# Patient Record
Sex: Female | Born: 1955 | Race: Black or African American | Hispanic: No | Marital: Single | Smoking: Never smoker
Health system: Southern US, Community
[De-identification: ages and names within clinical notes are randomized; demographics above are authoritative.]

## PROBLEM LIST (undated history)

## (undated) DIAGNOSIS — Z803 Family history of malignant neoplasm of breast: Secondary | ICD-10-CM

## (undated) DIAGNOSIS — I10 Essential (primary) hypertension: Secondary | ICD-10-CM

## (undated) DIAGNOSIS — Z8042 Family history of malignant neoplasm of prostate: Secondary | ICD-10-CM

## (undated) DIAGNOSIS — K649 Unspecified hemorrhoids: Secondary | ICD-10-CM

## (undated) DIAGNOSIS — Z923 Personal history of irradiation: Secondary | ICD-10-CM

## (undated) DIAGNOSIS — Z9221 Personal history of antineoplastic chemotherapy: Secondary | ICD-10-CM

## (undated) HISTORY — DX: Family history of malignant neoplasm of prostate: Z80.42

## (undated) HISTORY — PX: BREAST BIOPSY: SHX20

## (undated) HISTORY — PX: PARTIAL HYSTERECTOMY: SHX80

## (undated) HISTORY — PX: BREAST LUMPECTOMY: SHX2

## (undated) HISTORY — DX: Family history of malignant neoplasm of breast: Z80.3

---

## 1999-10-15 ENCOUNTER — Ambulatory Visit (HOSPITAL_COMMUNITY): Admission: RE | Admit: 1999-10-15 | Discharge: 1999-10-15 | Payer: Self-pay | Admitting: Family Medicine

## 1999-10-15 ENCOUNTER — Encounter: Payer: Self-pay | Admitting: Family Medicine

## 2000-10-26 ENCOUNTER — Ambulatory Visit (HOSPITAL_COMMUNITY): Admission: RE | Admit: 2000-10-26 | Discharge: 2000-10-26 | Payer: Self-pay | Admitting: Family Medicine

## 2005-05-07 ENCOUNTER — Encounter: Admission: RE | Admit: 2005-05-07 | Discharge: 2005-05-07 | Payer: Self-pay | Admitting: Obstetrics and Gynecology

## 2006-02-23 ENCOUNTER — Encounter: Admission: RE | Admit: 2006-02-23 | Discharge: 2006-02-23 | Payer: Self-pay | Admitting: Family Medicine

## 2006-05-10 ENCOUNTER — Encounter: Admission: RE | Admit: 2006-05-10 | Discharge: 2006-05-10 | Payer: Self-pay | Admitting: Surgery

## 2006-06-24 ENCOUNTER — Ambulatory Visit (HOSPITAL_BASED_OUTPATIENT_CLINIC_OR_DEPARTMENT_OTHER): Admission: RE | Admit: 2006-06-24 | Discharge: 2006-06-24 | Payer: Self-pay | Admitting: Surgery

## 2006-06-24 ENCOUNTER — Encounter (INDEPENDENT_AMBULATORY_CARE_PROVIDER_SITE_OTHER): Payer: Self-pay | Admitting: Specialist

## 2007-08-22 ENCOUNTER — Other Ambulatory Visit: Admission: RE | Admit: 2007-08-22 | Discharge: 2007-08-22 | Payer: Self-pay | Admitting: Family Medicine

## 2007-08-23 ENCOUNTER — Encounter: Admission: RE | Admit: 2007-08-23 | Discharge: 2007-08-23 | Payer: Self-pay | Admitting: Surgery

## 2008-09-20 ENCOUNTER — Encounter: Admission: RE | Admit: 2008-09-20 | Discharge: 2008-09-20 | Payer: Self-pay | Admitting: Family Medicine

## 2010-01-23 ENCOUNTER — Encounter: Admission: RE | Admit: 2010-01-23 | Discharge: 2010-01-23 | Payer: Self-pay | Admitting: Family Medicine

## 2010-03-17 ENCOUNTER — Other Ambulatory Visit: Admission: RE | Admit: 2010-03-17 | Discharge: 2010-03-17 | Payer: Self-pay | Admitting: Family Medicine

## 2010-10-09 NOTE — Op Note (Signed)
NAMEAMARANTA, Kathleen Potter              ACCOUNT NO.:  [id number]   MEDICAL RECORD NO.:  [id number]          PATIENT TYPE:  AMB   LOCATION:  [facility]                         FACILITY:  [facility]   PHYSICIAN:  Currie Paris, M.D.DATE OF BIRTH:  10-May-1956   DATE OF PROCEDURE:  06/24/2006  DATE OF DISCHARGE:                               OPERATIVE REPORT   OFFICE MEDICAL RECORD NUMBER [id number]   PREOPERATIVE DIAGNOSIS:  Nipple discharge, right breast.   POSTOPERATIVE DIAGNOSIS:  Nipple discharge, right breast.   OPERATION:  Central ductal excision, right breast.   SURGEON:  Currie Paris, M.D.   ANESTHESIA:  General.   CLINICAL HISTORY:  [name] is a 55 year old lady with a spontaneous  right breast nipple discharge and ductogram showing a filling defect.  After discussion with the patient, she elected to proceed to central  ductal excision for biopsy purposes.   DESCRIPTION OF PROCEDURE:  The patient was seen in the holding area and  she had no further questions.  She and I both marked the right side as  the operative side.   The patient was taken to the operating room and after satisfactory  general anesthesia, the breast was prepped and draped.  The time-out  occurred.   I reviewed the mammogram films and ductogram films and confirmed the  location of the duct.   With gentle manipulation, I was able to get some discharge from a  central duct.  I cannulated the duct with a double 0 tear duct probe.  I  then was able to cannulated it with a 24 Angiocath and inject some  methylene blue.  Once that was done, I put the tear duct probe back in.   I then made an incision, curvilinear in shape, at the superior areolar  margin.  I elevated the areolar skin until I could identify the duct,  both by identifying the probe in it and the bright blue color.  The duct  was divided just under the skin of the nipple.  The direction of the  probe was almost directly posteriorly, so once  that was removed, I  grasped the end of the breast tissue where the duct was then using  cautery, I took a cylinder of tissue around it, trying to incorporate  all the blue dye that I saw in the perfect ducts.  This went almost back  to the chest wall.   The specimen was then taken off the table and sent for pathology after  placing a suture on it to orient the nipple end for Pathology.   Attention was turned back to the breast.  I infiltrated 0.25% plain  Marcaine to help with postop analgesia.  We irrigated and made sure  everything was dry and then closed in layers with 3-0 Vicryl, 4-0  Monocryl subcuticular plus Dermabond on the skin.   The patient tolerated the procedure well.  There were no operative  complications.  All counts were correct.      Currie Paris, M.D.  Electronically Signed     CJS/MEDQ  D:  06/24/2006  T:  06/24/2006  Job:  [id number]   cc:   Sigmund Hazel, M.D.  Fax: [phone]

## 2012-03-01 ENCOUNTER — Other Ambulatory Visit: Payer: Self-pay | Admitting: Family Medicine

## 2012-03-01 DIAGNOSIS — Z1231 Encounter for screening mammogram for malignant neoplasm of breast: Secondary | ICD-10-CM

## 2012-03-21 ENCOUNTER — Ambulatory Visit: Payer: Self-pay

## 2012-03-23 ENCOUNTER — Ambulatory Visit
Admission: RE | Admit: 2012-03-23 | Discharge: 2012-03-23 | Disposition: A | Discharge status: Home / Self Care | Payer: BC Managed Care – PPO | Source: Ambulatory Visit | Attending: Family Medicine | Admitting: Family Medicine

## 2012-03-23 DIAGNOSIS — Z1231 Encounter for screening mammogram for malignant neoplasm of breast: Secondary | ICD-10-CM

## 2014-07-23 ENCOUNTER — Other Ambulatory Visit: Payer: Self-pay

## 2014-07-23 DIAGNOSIS — Z1231 Encounter for screening mammogram for malignant neoplasm of breast: Secondary | ICD-10-CM

## 2014-08-01 ENCOUNTER — Ambulatory Visit
Admission: RE | Admit: 2014-08-01 | Discharge: 2014-08-01 | Disposition: A | Discharge status: Home / Self Care | Payer: BC Managed Care – PPO | Source: Ambulatory Visit

## 2014-08-01 ENCOUNTER — Encounter (INDEPENDENT_AMBULATORY_CARE_PROVIDER_SITE_OTHER): Payer: Self-pay

## 2014-08-01 DIAGNOSIS — Z1231 Encounter for screening mammogram for malignant neoplasm of breast: Secondary | ICD-10-CM

## 2017-06-02 ENCOUNTER — Other Ambulatory Visit: Payer: Self-pay | Admitting: Family Medicine

## 2017-06-02 DIAGNOSIS — Z1231 Encounter for screening mammogram for malignant neoplasm of breast: Secondary | ICD-10-CM

## 2017-06-23 ENCOUNTER — Ambulatory Visit
Admission: RE | Admit: 2017-06-23 | Discharge: 2017-06-23 | Disposition: A | Discharge status: Home / Self Care | Payer: BC Managed Care – PPO | Source: Ambulatory Visit | Attending: Family Medicine | Admitting: Family Medicine

## 2017-06-23 DIAGNOSIS — Z1231 Encounter for screening mammogram for malignant neoplasm of breast: Secondary | ICD-10-CM

## 2017-06-24 ENCOUNTER — Other Ambulatory Visit: Payer: Self-pay | Admitting: Family Medicine

## 2017-06-24 DIAGNOSIS — R928 Other abnormal and inconclusive findings on diagnostic imaging of breast: Secondary | ICD-10-CM

## 2017-07-28 ENCOUNTER — Other Ambulatory Visit: Payer: BC Managed Care – PPO

## 2017-08-02 ENCOUNTER — Ambulatory Visit
Admission: RE | Admit: 2017-08-02 | Discharge: 2017-08-02 | Disposition: A | Discharge status: Home / Self Care | Payer: BC Managed Care – PPO | Source: Ambulatory Visit | Attending: Family Medicine | Admitting: Family Medicine

## 2017-08-02 DIAGNOSIS — R928 Other abnormal and inconclusive findings on diagnostic imaging of breast: Secondary | ICD-10-CM

## 2018-05-24 HISTORY — PX: BREAST LUMPECTOMY: SHX2

## 2018-11-03 ENCOUNTER — Other Ambulatory Visit: Payer: Self-pay | Admitting: Family Medicine

## 2018-11-03 DIAGNOSIS — Z1231 Encounter for screening mammogram for malignant neoplasm of breast: Secondary | ICD-10-CM

## 2018-12-21 ENCOUNTER — Other Ambulatory Visit: Payer: Self-pay

## 2018-12-21 ENCOUNTER — Ambulatory Visit
Admission: RE | Admit: 2018-12-21 | Discharge: 2018-12-21 | Disposition: A | Discharge status: Home / Self Care | Payer: BC Managed Care – PPO | Source: Ambulatory Visit | Attending: Family Medicine | Admitting: Family Medicine

## 2018-12-21 DIAGNOSIS — Z1231 Encounter for screening mammogram for malignant neoplasm of breast: Secondary | ICD-10-CM

## 2018-12-22 ENCOUNTER — Other Ambulatory Visit: Payer: Self-pay | Admitting: Family Medicine

## 2018-12-22 DIAGNOSIS — R928 Other abnormal and inconclusive findings on diagnostic imaging of breast: Secondary | ICD-10-CM

## 2018-12-25 ENCOUNTER — Other Ambulatory Visit: Payer: Self-pay | Admitting: Family Medicine

## 2018-12-27 ENCOUNTER — Ambulatory Visit
Admission: RE | Admit: 2018-12-27 | Discharge: 2018-12-27 | Disposition: A | Discharge status: Home / Self Care | Payer: BC Managed Care – PPO | Source: Ambulatory Visit | Attending: Family Medicine | Admitting: Family Medicine

## 2018-12-27 ENCOUNTER — Other Ambulatory Visit: Payer: Self-pay

## 2018-12-27 ENCOUNTER — Other Ambulatory Visit: Payer: Self-pay | Admitting: Family Medicine

## 2018-12-27 DIAGNOSIS — R928 Other abnormal and inconclusive findings on diagnostic imaging of breast: Secondary | ICD-10-CM

## 2018-12-27 DIAGNOSIS — N6310 Unspecified lump in the right breast, unspecified quadrant: Secondary | ICD-10-CM

## 2018-12-28 ENCOUNTER — Other Ambulatory Visit: Payer: Self-pay

## 2018-12-28 ENCOUNTER — Ambulatory Visit
Admission: RE | Admit: 2018-12-28 | Discharge: 2018-12-28 | Disposition: A | Discharge status: Home / Self Care | Payer: BC Managed Care – PPO | Source: Ambulatory Visit | Attending: Family Medicine | Admitting: Family Medicine

## 2018-12-28 DIAGNOSIS — N6310 Unspecified lump in the right breast, unspecified quadrant: Secondary | ICD-10-CM

## 2018-12-29 ENCOUNTER — Encounter: Payer: Self-pay | Admitting: *Deleted

## 2018-12-29 ENCOUNTER — Telehealth: Payer: Self-pay | Admitting: Hematology and Oncology

## 2018-12-29 NOTE — Telephone Encounter (Signed)
Spoke with patient to confirm afternoon [facility] appointment for 8/12, packet will be mailed to patient

## 2019-01-02 ENCOUNTER — Other Ambulatory Visit: Payer: Self-pay | Admitting: *Deleted

## 2019-01-02 DIAGNOSIS — Z171 Estrogen receptor negative status [ER-]: Secondary | ICD-10-CM | POA: Insufficient documentation

## 2019-01-02 DIAGNOSIS — C50211 Malignant neoplasm of upper-inner quadrant of right female breast: Secondary | ICD-10-CM

## 2019-01-02 NOTE — Progress Notes (Signed)
[facility] NOTE  Patient Care Team: [name], MD as PCP - General (Family Medicine) Rockwell Germany, RN as Oncology Nurse Navigator Tressie Ellis, Paulette Blanch, RN as Oncology Nurse Navigator Erroll Luna, MD as Consulting Physician (General Surgery) Nicholas Lose, MD as Consulting Physician (Hematology and Oncology) Kyung Rudd, MD as Consulting Physician (Radiation Oncology)  CHIEF COMPLAINTS/PURPOSE OF CONSULTATION:  Newly diagnosed breast cancer  HISTORY OF PRESENTING ILLNESS:  Kathleen Potter 63 y.o. female is here because of recent diagnosis of invasive ductal carcinoma of the right breast. The cancer was detected on a routine screening mammogram on 12/21/18 and was not palpable prior to diagnosis. Diagnostic mammogram on 12/27/18 showed a 1.2cm mass in the right breast at the 12:30 location 7 cm from the nipple with no axillary adenopathy. Biopsy on 12/28/18 showed invasive ductal carcinoma, grade 2-3, HER-2 negative (1+), ER/PR negative, Ki67 80%. She has a family history of breast cancer in her sister at 72yo. She presents to the clinic today for initial evaluation and discussion of treatment options.   I reviewed her records extensively and collaborated the history with the patient.  SUMMARY OF ONCOLOGIC HISTORY: Oncology History  Malignant neoplasm of upper-inner quadrant of right breast in female, estrogen receptor negative ([place])  01/02/2019 Initial Diagnosis   Routine screening mammogram detected a 1.2cm mass in the right breast at the 12:30 location 7 cm from the nipple, no axillary adenopathy. Biopsy showed IDC, grade 2-3, HER-2 - (1+), ER -, PR -, Ki67 80%.      MEDICAL HISTORY:  History reviewed. No pertinent past medical history.  SURGICAL HISTORY: Past Surgical History:  Procedure Laterality Date  . BREAST BIOPSY Right   . PARTIAL HYSTERECTOMY      SOCIAL HISTORY: Social History   Socioeconomic History  . Marital status: Single    Spouse  name: Not on file  . Number of children: Not on file  . Years of education: Not on file  . Highest education level: Not on file  Occupational History  . Not on file  Social Needs  . Financial resource strain: Not on file  . Food insecurity    Worry: Not on file    Inability: Not on file  . Transportation needs    Medical: Not on file    Non-medical: Not on file  Tobacco Use  . Smoking status: Never Smoker  Substance and Sexual Activity  . Alcohol use: Not Currently  . Drug use: Never  . Sexual activity: Not on file  Lifestyle  . Physical activity    Days per week: Not on file    Minutes per session: Not on file  . Stress: Not on file  Relationships  . Social Herbalist on phone: Not on file    Gets together: Not on file    Attends religious service: Not on file    Active member of club or organization: Not on file    Attends meetings of clubs or organizations: Not on file    Relationship status: Not on file  . Intimate partner violence    Fear of current or ex partner: Not on file    Emotionally abused: Not on file    Physically abused: Not on file    Forced sexual activity: Not on file  Other Topics Concern  . Not on file  Social History Narrative  . Not on file    FAMILY HISTORY: Family History  Problem Relation Age of  Onset  . Breast cancer Sister 8    ALLERGIES:  has no allergies on file.  MEDICATIONS:  No current outpatient medications on file.   No current facility-administered medications for this visit.     REVIEW OF SYSTEMS:   Constitutional: Denies fevers, chills or abnormal night sweats Eyes: Denies blurriness of vision, double vision or watery eyes Ears, nose, mouth, throat, and face: Denies mucositis or sore throat Respiratory: Denies cough, dyspnea or wheezes Cardiovascular: Denies palpitation, chest discomfort or lower extremity swelling Gastrointestinal:  Denies nausea, heartburn or change in bowel habits Skin: Denies  abnormal skin rashes Lymphatics: Denies new lymphadenopathy or easy bruising Neurological:Denies numbness, tingling or new weaknesses Behavioral/Psych: Mood is stable, no new changes  Breast: Denies any palpable lumps or discharge All other systems were reviewed with the patient and are negative.  PHYSICAL EXAMINATION: ECOG PERFORMANCE STATUS: 0 - Asymptomatic  Vitals:   01/03/19 1240  BP: (!) 147/71  Pulse: 65  Resp: 18  Temp: 97.8 F (36.6 C)  SpO2: 100%   Filed Weights   01/03/19 1240  Weight: 262 lb 3.2 oz (118.9 kg)    GENERAL:alert, no distress and comfortable SKIN: skin color, texture, turgor are normal, no rashes or significant lesions EYES: normal, conjunctiva are pink and non-injected, sclera clear OROPHARYNX:no exudate, no erythema and lips, buccal mucosa, and tongue normal  NECK: supple, thyroid normal size, non-tender, without nodularity LYMPH:  no palpable lymphadenopathy in the cervical, axillary or inguinal LUNGS: clear to auscultation and percussion with normal breathing effort HEART: regular rate & rhythm and no murmurs and no lower extremity edema ABDOMEN:abdomen soft, non-tender and normal bowel sounds Musculoskeletal:no cyanosis of digits and no clubbing  PSYCH: alert & oriented x 3 with fluent speech NEURO: no focal motor/sensory deficits BREAST: No palpable nodules in breast. No palpable axillary or supraclavicular lymphadenopathy (exam performed in the presence of a chaperone)   LABORATORY DATA:  I have reviewed the data as listed Lab Results  Component Value Date   WBC 5.4 01/03/2019   HGB 12.0 01/03/2019   HCT 38.3 01/03/2019   MCV 71.9 (L) 01/03/2019   PLT 201 01/03/2019   Lab Results  Component Value Date   NA 140 01/03/2019   K 3.7 01/03/2019   CL 108 01/03/2019   CO2 23 01/03/2019    RADIOGRAPHIC STUDIES: I have personally reviewed the radiological reports and agreed with the findings in the report.  ASSESSMENT AND PLAN:   Malignant neoplasm of upper-inner quadrant of right breast in female, estrogen receptor negative ([place]) 01/02/2019:Routine screening mammogram detected a 1.2cm mass in the right breast at the 12:30 location 7 cm from the nipple, no axillary adenopathy. Biopsy showed IDC, grade 2-3, HER-2 - (1+), ER -, PR -, Ki67 80%.   Pathology and radiology counseling: Discussed with the patient, the details of pathology including the type of breast cancer,the clinical staging, the significance of ER, PR and HER-2/neu receptors and the implications for treatment. After reviewing the pathology in detail, we proceeded to discuss the different treatment options between surgery, radiation, chemotherapy, antiestrogen therapies.  Treatment plan: 1.  Breast conserving surgery with sentinel for biopsy 2.  Adjuvant chemotherapy with dose dense Adriamycin and Cytoxan x4 followed by Taxol weekly x12 3.  Followed by radiation therapy  Chemotherapy Counseling: I discussed the risks and benefits of chemotherapy including the risks of nausea/ vomiting, risk of infection from low WBC count, fatigue due to chemo or anemia, bruising or bleeding due to low platelets,  mouth sores, loss/ change in taste and decreased appetite. Liver and kidney function will be monitored through out chemotherapy as abnormalities in liver and kidney function may be a side effect of treatment. Cardiac dysfunction due to Adriamycin was discussed in detail. Risk of permanent bone marrow dysfunction and leukemia due to chemo were also discussed.  Return to clinic after surgery to discuss final pathology report and to discuss her starting adjuvant chemotherapy.  She would be eligible for upbeat clinical trial. 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  She may be taking disability or FMLA for her treatments.  All questions were answered. The patient knows to call the clinic with any problems, questions or concerns.    Rulon Eisenmenger, MD 01/03/2019    I, Molly Dorshimer, am acting as scribe for Nicholas Lose, MD.  I have reviewed the above documentation for accuracy and completeness, and I agree with the above.

## 2019-01-03 ENCOUNTER — Ambulatory Visit: Payer: BC Managed Care – PPO | Attending: Surgery | Admitting: Physical Therapy

## 2019-01-03 ENCOUNTER — Ambulatory Visit (HOSPITAL_BASED_OUTPATIENT_CLINIC_OR_DEPARTMENT_OTHER): Payer: BC Managed Care – PPO | Admitting: Genetic Counselor

## 2019-01-03 ENCOUNTER — Other Ambulatory Visit: Payer: Self-pay

## 2019-01-03 ENCOUNTER — Encounter: Payer: Self-pay | Admitting: Genetic Counselor

## 2019-01-03 ENCOUNTER — Ambulatory Visit: Payer: Self-pay | Admitting: Surgery

## 2019-01-03 ENCOUNTER — Inpatient Hospital Stay: Payer: BC Managed Care – PPO | Attending: Hematology and Oncology | Admitting: Hematology and Oncology

## 2019-01-03 ENCOUNTER — Encounter: Payer: Self-pay | Admitting: Physical Therapy

## 2019-01-03 ENCOUNTER — Ambulatory Visit
Admission: RE | Admit: 2019-01-03 | Discharge: 2019-01-03 | Disposition: A | Discharge status: Home / Self Care | Payer: BC Managed Care – PPO | Source: Ambulatory Visit | Attending: Radiation Oncology | Admitting: Radiation Oncology

## 2019-01-03 ENCOUNTER — Encounter: Payer: Self-pay | Admitting: *Deleted

## 2019-01-03 ENCOUNTER — Encounter: Payer: Self-pay | Admitting: Hematology and Oncology

## 2019-01-03 ENCOUNTER — Inpatient Hospital Stay: Payer: BC Managed Care – PPO

## 2019-01-03 VITALS — BP 147/71 | HR 65 | Temp 97.8°F | Resp 18 | Ht 66.0 in | Wt 262.2 lb

## 2019-01-03 DIAGNOSIS — C50211 Malignant neoplasm of upper-inner quadrant of right female breast: Secondary | ICD-10-CM

## 2019-01-03 DIAGNOSIS — Z803 Family history of malignant neoplasm of breast: Secondary | ICD-10-CM | POA: Insufficient documentation

## 2019-01-03 DIAGNOSIS — C50911 Malignant neoplasm of unspecified site of right female breast: Secondary | ICD-10-CM

## 2019-01-03 DIAGNOSIS — Z171 Estrogen receptor negative status [ER-]: Secondary | ICD-10-CM

## 2019-01-03 DIAGNOSIS — R293 Abnormal posture: Secondary | ICD-10-CM | POA: Insufficient documentation

## 2019-01-03 DIAGNOSIS — Z8042 Family history of malignant neoplasm of prostate: Secondary | ICD-10-CM

## 2019-01-03 LAB — CBC WITH DIFFERENTIAL (CANCER CENTER ONLY)
Abs Immature Granulocytes: 0.01 10*3/uL (ref 0.00–0.07)
Basophils Absolute: 0 10*3/uL (ref 0.0–0.1)
Basophils Relative: 0 %
Eosinophils Absolute: 0 10*3/uL (ref 0.0–0.5)
Eosinophils Relative: 1 %
HCT: 38.3 % (ref 36.0–46.0)
Hemoglobin: 12 g/dL (ref 12.0–15.0)
Immature Granulocytes: 0 %
Lymphocytes Relative: 41 %
Lymphs Abs: 2.2 10*3/uL (ref 0.7–4.0)
MCH: 22.5 pg — ABNORMAL LOW (ref 26.0–34.0)
MCHC: 31.3 g/dL (ref 30.0–36.0)
MCV: 71.9 fL — ABNORMAL LOW (ref 80.0–100.0)
Monocytes Absolute: 0.4 10*3/uL (ref 0.1–1.0)
Monocytes Relative: 7 %
Neutro Abs: 2.8 10*3/uL (ref 1.7–7.7)
Neutrophils Relative %: 51 %
Platelet Count: 201 10*3/uL (ref 150–400)
RBC: 5.33 MIL/uL — ABNORMAL HIGH (ref 3.87–5.11)
RDW: 15.9 % — ABNORMAL HIGH (ref 11.5–15.5)
WBC Count: 5.4 10*3/uL (ref 4.0–10.5)
nRBC: 0 % (ref 0.0–0.2)

## 2019-01-03 LAB — CMP (CANCER CENTER ONLY)
ALT: 15 U/L (ref 0–44)
AST: 16 U/L (ref 15–41)
Albumin: 3.7 g/dL (ref 3.5–5.0)
Alkaline Phosphatase: 73 U/L (ref 38–126)
Anion gap: 9 (ref 5–15)
BUN: 12 mg/dL (ref 8–23)
CO2: 23 mmol/L (ref 22–32)
Calcium: 8.8 mg/dL — ABNORMAL LOW (ref 8.9–10.3)
Chloride: 108 mmol/L (ref 98–111)
Creatinine: 0.95 mg/dL (ref 0.44–1.00)
GFR, Est AFR Am: >60 mL/min (ref >60)
GFR, Estimated: >60 mL/min (ref >60)
Glucose, Bld: 93 mg/dL (ref 70–99)
Potassium: 3.7 mmol/L (ref 3.5–5.1)
Sodium: 140 mmol/L (ref 135–145)
Total Bilirubin: 0.4 mg/dL (ref 0.3–1.2)
Total Protein: 7.2 g/dL (ref 6.5–8.1)

## 2019-01-03 NOTE — Assessment & Plan Note (Signed)
01/02/2019:Routine screening mammogram detected a 1.2cm mass in the right breast at the 12:30 location 7 cm from the nipple, no axillary adenopathy. Biopsy showed IDC, grade 2-3, HER-2 - (1+), ER -, PR -, Ki67 80%.   Pathology and radiology counseling: Discussed with the patient, the details of pathology including the type of breast cancer,the clinical staging, the significance of ER, PR and HER-2/neu receptors and the implications for treatment. After reviewing the pathology in detail, we proceeded to discuss the different treatment options between surgery, radiation, chemotherapy, antiestrogen therapies.  Treatment plan: 1.  Breast conserving surgery with sentinel for biopsy 2.  Adjuvant chemotherapy with dose dense Adriamycin and Cytoxan x4 followed by Taxol weekly x12 3.  Followed by radiation therapy  Chemotherapy Counseling: I discussed the risks and benefits of chemotherapy including the risks of nausea/ vomiting, risk of infection from low WBC count, fatigue due to chemo or anemia, bruising or bleeding due to low platelets, mouth sores, loss/ change in taste and decreased appetite. Liver and kidney function will be monitored through out chemotherapy as abnormalities in liver and kidney function may be a side effect of treatment. Cardiac dysfunction due to Adriamycin was discussed in detail. Risk of permanent bone marrow dysfunction and leukemia due to chemo were also discussed.  Return to clinic after surgery to discuss final pathology report and to discuss her starting adjuvant chemotherapy.  She would be eligible for upbeat clinical trial.

## 2019-01-03 NOTE — Therapy (Signed)
[place], [place], [address] Phone: [phone]   Fax:  [phone]  Physical Therapy Evaluation  Patient Details  Name: Kathleen Potter MRN: [id number] Date of Birth: 1956/05/22 Referring Provider (PT): Dr. Erroll Luna   Encounter Date: 01/03/2019  PT End of Session - 01/03/19 1404    Visit Number  1    Number of Visits  2    Date for PT Re-Evaluation  02/28/19    PT Start Time  1329    PT Stop Time  1343   Also saw pt from 1405-1415 for a total of 24 min   PT Time Calculation (min)  14 min    Activity Tolerance  Patient tolerated treatment well    Behavior During Therapy  [facility] for tasks assessed/performed       History reviewed. No pertinent past medical history.  Past Surgical History:  Procedure Laterality Date  . BREAST BIOPSY Right   . PARTIAL HYSTERECTOMY      There were no vitals filed for this visit.   Subjective Assessment - 01/03/19 1346    Subjective  Patient reports she is here today to be seen by her medical team for her newly diagnosed right breast cancer.    Pertinent History  Patient was diagnosed on 12/21/2018 with right grade II-III invasive ductal carcinoma breast cancer. It measures 1.2 cm and is located in the upper inner quadrant. It is triple negative with a Ki67 of 80%.    Patient Stated Goals  Reduce lymphedema risk and learn post op shoulder ROM HEP    Currently in Pain?  No/denies         [facility] PT Assessment - 01/03/19 0001      Assessment   Medical Diagnosis  Right breast cancer    Referring Provider (PT)  Dr. Marcello Moores [place]    Onset Date/Surgical Date  12/21/18    Hand Dominance  Right    Prior Therapy  none      Precautions   Precautions  Other (comment)    Precaution Comments  active cancer      Restrictions   Weight Bearing Restrictions  No      Balance Screen   Has the patient fallen in the past 6 months  No    Has the patient had a  decrease in activity level because of a fear of falling?   No    Is the patient reluctant to leave their home because of a fear of falling?   No      Home Environment   Living Environment  Private residence    Living Arrangements  Alone    Available Help at Discharge  Friend(s)      Prior Function   Level of Independence  Independent    Vocation  Full time employment    Vocation Requirements  Office asst at [employer]  She does not exercise      Cognition   Overall Cognitive Status  Within Functional Limits for tasks assessed      Posture/Postural Control   Posture/Postural Control  Postural limitations    Postural Limitations  Rounded Shoulders;Forward head      ROM / Strength   AROM / PROM / Strength  AROM;Strength      AROM   Overall AROM Comments  Cervical AROM is all WNL    AROM Assessment Site  Shoulder;Cervical    Right/Left Shoulder  Right;Left  Right Shoulder Extension  55 Degrees    Right Shoulder Flexion  143 Degrees    Right Shoulder ABduction  133 Degrees    Right Shoulder Internal Rotation  54 Degrees   painful   Right Shoulder External Rotation  78 Degrees    Left Shoulder Extension  60 Degrees    Left Shoulder Flexion  138 Degrees    Left Shoulder ABduction  129 Degrees    Left Shoulder Internal Rotation  58 Degrees    Left Shoulder External Rotation  71 Degrees      Strength   Overall Strength  Within functional limits for tasks performed        LYMPHEDEMA/ONCOLOGY QUESTIONNAIRE - 01/03/19 1349      Type   Cancer Type  Right breast      Lymphedema Assessments   Lymphedema Assessments  Upper extremities      Right Upper Extremity Lymphedema   10 cm Proximal to Olecranon Process  34.5 cm    Olecranon Process  30.2 cm    10 cm Proximal to Ulnar Styloid Process  25.8 cm    Just Proximal to Ulnar Styloid Process  17.5 cm    Across Hand at [employer]  20.3 cm    At [place] of 2nd Digit  7.3 cm      Left Upper Extremity Lymphedema   10  cm Proximal to Olecranon Process  36.3 cm    Olecranon Process  30.3 cm    10 cm Proximal to Ulnar Styloid Process  26.2 cm    Just Proximal to Ulnar Styloid Process  17.6 cm    Across Hand at [employer]  20.1 cm    At [place] of 2nd Digit  7 cm          Quick Dash - 01/03/19 0001    Open a tight or new jar  No difficulty    Do heavy household chores (wash walls, wash floors)  No difficulty    Carry a shopping bag or briefcase  No difficulty    Wash your back  No difficulty    Use a knife to cut food  No difficulty    Recreational activities in which you take some force or impact through your arm, shoulder, or hand (golf, hammering, tennis)  No difficulty    During the past week, to what extent has your arm, shoulder or hand problem interfered with your normal social activities with family, friends, neighbors, or groups?  Not at all    During the past week, to what extent has your arm, shoulder or hand problem limited your work or other regular daily activities  Not at all    Arm, shoulder, or hand pain.  None    Tingling (pins and needles) in your arm, shoulder, or hand  None    Difficulty Sleeping  No difficulty    DASH Score  0 %        Objective measurements completed on examination: See above findings.       Patient was instructed today in a home exercise program today for post op shoulder range of motion. These included active assist shoulder flexion in sitting, scapular retraction, wall walking with shoulder abduction, and hands behind head external rotation.  She was encouraged to do these twice a day, holding 3 seconds and repeating 5 times when permitted by her physician.           PT Education - 01/03/19 1354  Education Details  Lymphedema risk reduction and post op shoulder ROM HEP    Person(s) Educated  Patient    Methods  Explanation;Demonstration;Handout    Comprehension  Returned demonstration;Verbalized understanding          PT Long Term  Goals - 01/03/19 1427      PT LONG TERM GOAL #1   Title  Patient will demonstrate she has regained full shoulder ROM and function post operatively compared to baselines.    Time  8    Period  Weeks    Status  New      Breast Clinic Goals - 01/03/19 1425      Patient will be able to verbalize understanding of pertinent lymphedema risk reduction practices relevant to her diagnosis specifically related to skin care.   Time  1    Period  Days    Status  Achieved      Patient will be able to return demonstrate and/or verbalize understanding of the post-op home exercise program related to regaining shoulder range of motion.   Time  1    Period  Days    Status  Achieved      Patient will be able to verbalize understanding of the importance of attending the postoperative After Breast Cancer Class for further lymphedema risk reduction education and therapeutic exercise.   Time  1    Period  Days    Status  Achieved            Plan - 01/03/19 1405    Clinical Impression Statement  Patient was diagnosed on 12/21/2018 with right grade II-III invasive ductal carcinoma breast cancer. It measures 1.2 cm and is located in the upper inner quadrant. It is triple negative with a Ki67 of 80%. Her multidisciplinary medical team met prior to her assessments to determine a recommended treatment plan. She is planning to have a right lumpectomy and sentinel node biopsy followed by chemotherapy and radiation. She will benefit from a post op PT visit to reassess and determine needs.    Stability/Clinical Decision Making  Stable/Uncomplicated    Clinical Decision Making  Low    Rehab Potential  Excellent    PT Frequency  --   Eval and 1 f/u visit   PT Treatment/Interventions  Patient/family education;Therapeutic exercise;ADLs/Self Care Home Management    PT Next Visit Plan  Will reassess 3-4 weeks post op to determine needs    PT Home Exercise Plan  Post op shoulder ROM HEP    Consulted and Agree with  Plan of Care  Patient       Patient will benefit from skilled therapeutic intervention in order to improve the following deficits and impairments:  Postural dysfunction, Decreased range of motion, Decreased knowledge of precautions, Impaired UE functional use, Pain  Visit Diagnosis: 1. Malignant neoplasm of upper-inner quadrant of right breast in female, estrogen receptor negative ([place])   2. Abnormal posture      Patient will follow up at outpatient cancer rehab 3-4 weeks following surgery.  If the patient requires physical therapy at that time, a specific plan will be dictated and sent to the referring physician for approval. The patient was educated today on appropriate basic range of motion exercises to begin post operatively and the importance of attending the After Breast Cancer class following surgery.  Patient was educated today on lymphedema risk reduction practices as it pertains to recommendations that will benefit the patient immediately following surgery.  She verbalized good understanding.  Problem List Patient Active Problem List   Diagnosis Date Noted  . Malignant neoplasm of upper-inner quadrant of right breast in female, estrogen receptor negative ([place]) 01/02/2019   Annia Friendly, PT 01/03/19 2:29 PM  [place], [place], [address] Phone: [phone]   Fax:  [phone]  Name: Wilmary Levit MRN: [id number] Date of Birth: [birth year]

## 2019-01-03 NOTE — Progress Notes (Signed)
Radiation Oncology         [phone] ________________________________  Name: Kathleen Potter        MRN: [id number]  Date of Service: 01/03/2019 DOB: Oct 05, 1955  UL:AGTXMI, Kathleen Haw, MD  Erroll Luna, MD     REFERRING PHYSICIAN: Erroll Luna, MD   DIAGNOSIS: The encounter diagnosis was Malignant neoplasm of upper-inner quadrant of right breast in female, estrogen receptor negative ([place]).   HISTORY OF PRESENT ILLNESS: Kathleen Potter is a 63 y.o. female seen in the multidisciplinary breast clinic for a new diagnosis of right breast cancer. The patient was noted to have a screening detected abnormality in the posterior right breast.  Diagnostic imaging revealed this at the 12:30 position measuring 1.2 x 1.2 x 1.1 cm, and her right axilla was negative for adenopathy.  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  She is seen today to discuss treatment recommendations for her cancer.    PREVIOUS RADIATION THERAPY: No   PAST MEDICAL HISTORY: No past medical history on file.     PAST SURGICAL HISTORY: Past Surgical History:  Procedure Laterality Date   BREAST BIOPSY Right    PARTIAL HYSTERECTOMY       FAMILY HISTORY:  Family History  Problem Relation Age of Onset   Breast cancer Sister 12     SOCIAL HISTORY:  reports that she has never smoked. She does not have any smokeless tobacco history on file. She reports previous alcohol use. She reports that she does not use drugs. The patient is single and lives in [place]. 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 She's originally from [place].    ALLERGIES: Latex   MEDICATIONS:  No current outpatient medications on file.   No current facility-administered medications for this encounter.      REVIEW OF SYSTEMS: On review of systems, the patient reports that she is  doing well overall. She denies any chest pain, shortness of breath, cough, fevers, chills, night sweats, unintended weight changes. She denies any bowel or bladder disturbances, and denies abdominal pain, nausea or vomiting. She denies any new musculoskeletal or joint aches or pains. A complete review of systems is obtained and is otherwise negative.     PHYSICAL EXAM:  Wt Readings from Last 3 Encounters:  01/03/19 262 lb 3.2 oz (118.9 kg)   Temp Readings from Last 3 Encounters:  01/03/19 97.8 F (36.6 C) (Oral)   BP Readings from Last 3 Encounters:  01/03/19 (!) 147/71   Pulse Readings from Last 3 Encounters:  01/03/19 65     In general this is a well appearing African-American female in no acute distress.  She's alert and oriented x4 and appropriate throughout the examination. Cardiopulmonary assessment is negative for acute distress and she exhibits normal effort. Breast exam is deferred.   ECOG = 0  0 - Asymptomatic (Fully active, able to carry on all predisease activities without restriction)  1 - Symptomatic but completely ambulatory (Restricted in physically strenuous activity but ambulatory and able to carry out work of a light or sedentary nature. For example, light housework, office work)  2 - Symptomatic, <50% in bed during the day (Ambulatory and capable of all self care but unable to carry out any work activities. Up and about more than 50% of waking hours)  3 - Symptomatic, >50% in bed, but not bedbound (Capable of only limited  self-care, confined to bed or chair 50% or more of waking hours)  4 - Bedbound (Completely disabled. Cannot carry on any self-care. Totally confined to bed or chair)  5 - Death   Eustace Pen MM, Creech RH, Tormey DC, et al. [phone]). "Toxicity and response criteria of the [facility]". [place] Oncol. 5 (6): 649-55    LABORATORY DATA:  Lab Results  Component Value Date   WBC 5.4 01/03/2019   HGB 12.0 01/03/2019    HCT 38.3 01/03/2019   MCV 71.9 (L) 01/03/2019   PLT 201 01/03/2019   Lab Results  Component Value Date   NA 140 01/03/2019   K 3.7 01/03/2019   CL 108 01/03/2019   CO2 23 01/03/2019   Lab Results  Component Value Date   ALT 15 01/03/2019   AST 16 01/03/2019   ALKPHOS 73 01/03/2019   BILITOT 0.4 01/03/2019      RADIOGRAPHY: US Breast Ltd Uni Right Inc Axilla  Result Date: 12/27/2018 CLINICAL DATA:  Patient returns after screening study for evaluation of possible RIGHT breast mass. EXAM: DIGITAL DIAGNOSTIC RIGHT MAMMOGRAM WITH CAD AND TOMO ULTRASOUND RIGHT BREAST COMPARISON:  12/21/2018 and earlier ACR Breast Density Category b: There are scattered areas of fibroglandular density. FINDINGS: Additional 2-D and 3-D images are performed. These views confirm presence of an irregular mass in the [place] of the RIGHT breast. Mammographic images were processed with CAD. On physical exam, I palpate no abnormality in the UPPER INNER QUADRANT of the RIGHT breast. Targeted ultrasound is performed, showing an irregular hypoechoic taller than wide lesion in the 12:30 o'clock location RIGHT breast 7 centimeters from the nipple which measures 1.1 x 1.2 x 1.2 centimeters. There is associated internal blood flow on Doppler evaluation. Evaluation of the LEFT axilla is negative for adenopathy. IMPRESSION: Suspicious mass in the 12:30 o'clock location of the RIGHT breast. Tissue diagnosis is recommended. RIGHT axilla is negative. RECOMMENDATION: Ultrasound-guided core biopsy of the RIGHT breast I have discussed the findings and recommendations with the patient. Results were also provided in writing at the conclusion of the visit. If applicable, a reminder letter will be sent to the patient regarding the next appointment. BI-RADS CATEGORY  4: Suspicious. Electronically Signed   By: Nolon Nations M.D.   On: 12/27/2018 11:25   Mm Diag Breast Tomo Uni Right  Result Date: 12/27/2018 CLINICAL DATA:   Patient returns after screening study for evaluation of possible RIGHT breast mass. EXAM: DIGITAL DIAGNOSTIC RIGHT MAMMOGRAM WITH CAD AND TOMO ULTRASOUND RIGHT BREAST COMPARISON:  12/21/2018 and earlier ACR Breast Density Category b: There are scattered areas of fibroglandular density. FINDINGS: Additional 2-D and 3-D images are performed. These views confirm presence of an irregular mass in the [place] of the RIGHT breast. Mammographic images were processed with CAD. On physical exam, I palpate no abnormality in the UPPER INNER QUADRANT of the RIGHT breast. Targeted ultrasound is performed, showing an irregular hypoechoic taller than wide lesion in the 12:30 o'clock location RIGHT breast 7 centimeters from the nipple which measures 1.1 x 1.2 x 1.2 centimeters. There is associated internal blood flow on Doppler evaluation. Evaluation of the LEFT axilla is negative for adenopathy. IMPRESSION: Suspicious mass in the 12:30 o'clock location of the RIGHT breast. Tissue diagnosis is recommended. RIGHT axilla is negative. RECOMMENDATION: Ultrasound-guided core biopsy of the RIGHT breast I have discussed the findings and recommendations with the patient. Results were also provided in writing at the conclusion of  the visit. If applicable, a reminder letter will be sent to the patient regarding the next appointment. BI-RADS CATEGORY  4: Suspicious. Electronically Signed   By: Nolon Nations M.D.   On: 12/27/2018 11:25   Mm 3d Screen Breast Bilateral  Result Date: 12/21/2018 CLINICAL DATA:  Screening. EXAM: DIGITAL SCREENING BILATERAL MAMMOGRAM WITH TOMO AND CAD COMPARISON:  Previous exam(s). ACR Breast Density Category c: The breast tissue is heterogeneously dense, which may obscure small masses. FINDINGS: In the right breast, a possible mass warrants further evaluation. In the left breast, no findings suspicious for malignancy. Images were processed with CAD. IMPRESSION: Further evaluation is suggested for  possible mass in the right breast. RECOMMENDATION: Diagnostic mammogram and possibly ultrasound of the right breast. (Code:[id number]) The patient will be contacted regarding the findings, and additional imaging will be scheduled. BI-RADS CATEGORY  0: Incomplete. Need additional imaging evaluation and/or prior mammograms for comparison. Electronically Signed   By: Marin Olp M.D.   On: 12/21/2018 11:43   Mm Clip Placement Right  Result Date: 12/28/2018 CLINICAL DATA:  Patient underwent ultrasound-guided needle core biopsy for right breast mass. EXAM: DIAGNOSTIC RIGHT MAMMOGRAM POST ULTRASOUND BIOPSY COMPARISON:  Previous exam(s). FINDINGS: Mammographic images were obtained following ultrasound-guided core needle biopsy of a right breast mass which demonstrates appropriate placement of a ribbon clip. IMPRESSION: Appropriate clip placement following needle core biopsy of the right breast. Final Assessment: Post Procedure Mammograms for Marker Placement Electronically Signed   By: Audie Pinto M.D.   On: 12/28/2018 13:43   [place] Rt Breast Bx W Loc Dev 1st Lesion Img Bx Spec US Guide  Addendum Date: 01/01/2019   ADDENDUM REPORT: 01/01/2019 07:11 ADDENDUM: Pathology revealed GRADE II-III INVASIVE DUCTAL CARCINOMA WITH LYMPHOCYTE-RICH STROMA of the Right breast, 12:30 o'clock, (ribbon clip). This was found to be concordant by Dr. Audie Pinto. Pathology results were discussed with the patient by telephone. The patient reported doing well after the biopsy with tenderness at the site. Post biopsy instructions and care were reviewed and questions were answered. The patient was encouraged to call [facility] for any additional concerns. The patient was referred to [facility] at [facility] on January 03, 2019. Pathology results reported by Terie Purser, RN on 01/01/2019. Electronically Signed   By: Audie Pinto M.D.    On: 01/01/2019 07:11   Result Date: 01/01/2019 CLINICAL DATA:  Patient presents for biopsy of a right breast mass. EXAM: ULTRASOUND GUIDED RIGHT BREAST CORE NEEDLE BIOPSY COMPARISON:  Previous exam(s). FINDINGS: I met with the patient and we discussed the procedure of ultrasound-guided biopsy, including benefits and alternatives. We discussed the high likelihood of a successful procedure. We discussed the risks of the procedure, including infection, bleeding, tissue injury, clip migration, and inadequate sampling. Informed written consent was given. The usual time-out protocol was performed immediately prior to the procedure. Lesion quadrant: Upper inner quadrant Using sterile technique and 1% Lidocaine as local anesthetic, under direct ultrasound visualization, a 12 gauge spring-loaded device was used to perform biopsy of a right breast mass at 12:30 using a medial approach. At the conclusion of the procedure a ribbon tissue marker clip was deployed into the biopsy cavity. Follow up 2 view mammogram was performed and dictated separately. IMPRESSION: Ultrasound guided biopsy of a right breast mass at 12:30. No apparent complications. Electronically Signed: By: Audie Pinto M.D. On: 12/28/2018 13:44       IMPRESSION/PLAN: 1. Stage IB,  [id number] grade 3, triple negative invasive ductal carcinoma of the right breast. Dr. Lisbeth Renshaw discusses the pathology findings and reviews the nature of triple negative breast disease. The consensus from the breast conference includes breast conservation with lumpectomy with  sentinel node biopsy and placement of a PAC for adjuvant chemotherapy.  Following systemic chemotherapy, the patient would benefit from adjuvant radiotherapy to the breast to reduce the risk of local recurrence.  We discussed the risks, benefits, short, and long term effects of radiotherapy, and the patient is interested in proceeding. Dr. Lisbeth Renshaw discusses the delivery and logistics of radiotherapy and  anticipates a course of 4-6 1/2 weeks of radiotherapy. We will see her back about 2 to 3 weeks after completing chemotherapy to discuss moving forward with radiation. 2. Possible genetic predisposition to malignancy. The patient is a candidate for genetic testing given her personal and family history. She was offered referral and is interested in proceeding.   In a visit lasting 30 minutes, greater than 50% of the time was spent face to face discussing her case, and coordinating the patient's care.  The above documentation reflects my direct findings during this shared patient visit. Please see the separate note by Dr. Lisbeth Renshaw on this date for the remainder of the patient's plan of care.    Carola Rhine, PAC

## 2019-01-03 NOTE — H&P (Signed)
Barron Schmid Documented: 01/03/2019 7:20 AM Location: [facility] Patient #: [id number] DOB: 04/14/1956 Undefined / Language: Kathleen Potter / Race: Black or African American Female  History of Present Illness Marcello Moores A. Basheer Molchan MD; 01/03/2019 2:16 PM) Patient words: 63 YO female sent at the request of Dr Lindi Adie for [place] mammographic 1.2 cm mass right breast upper inner quadrant. Triple negative per pathology IDC grade 3 Sister with breast cancer in her 42's. No complaints of mass discharge or change in the appearance of her mammogram.  The patient is a 63 year old female.   Past Surgical History Tawni Pummel, RN; 01/03/2019 7:21 AM) Breast Biopsy Right. Hysterectomy (not due to cancer) - Partial Oral Surgery  Diagnostic Studies History Tawni Pummel, RN; 01/03/2019 7:21 AM) Colonoscopy >10 years ago Mammogram within last year Pap Smear >5 years ago  Medication History Tawni Pummel, RN; 01/03/2019 7:21 AM) Medications Reconciled  Social History Tawni Pummel, RN; 01/03/2019 7:21 AM) No alcohol use No drug use Tobacco use Never smoker.  Family History Tawni Pummel, RN; 01/03/2019 7:21 AM) Alcohol Abuse Father. Arthritis Father. Breast Cancer Sister. Diabetes Mellitus Family Members In General, Father. Heart Disease Father. Hypertension Father. Kidney Disease Father. Respiratory Condition Father, Mother.  Pregnancy / Birth History Tawni Pummel, RN; 01/03/2019 7:21 AM) Age at menarche 34 years. Age of menopause 41-50 Gravida 1 Irregular periods Maternal age 68-20 Para 1  Other Problems Tawni Pummel, RN; 01/03/2019 7:21 AM) Hemorrhoids     Review of Systems (Birdia Jaycox A. Anothony Bursch MD; 01/03/2019 2:16 PM) General Not Present- Appetite Loss, Chills, Fatigue, Fever, Night Sweats, Weight Gain and Weight Loss. Skin Not Present- Change in Wart/Mole, Dryness, Hives, Jaundice, New Lesions, Non-Healing Wounds, Rash and Ulcer. HEENT Not  Present- Earache, Hearing Loss, Hoarseness, Nose Bleed, Oral Ulcers, Ringing in the Ears, Seasonal Allergies, Sinus Pain, Sore Throat, Visual Disturbances, Wears glasses/contact lenses and Yellow Eyes. Respiratory Not Present- Bloody sputum, Chronic Cough, Difficulty Breathing, Snoring and Wheezing. Breast Not Present- Breast Mass, Breast Pain, Nipple Discharge and Skin Changes. Cardiovascular Not Present- Chest Pain, Difficulty Breathing Lying Down, Leg Cramps, Palpitations, Rapid Heart Rate, Shortness of Breath and Swelling of Extremities. Gastrointestinal Not Present- Abdominal Pain, Bloating, Bloody Stool, Change in Bowel Habits, Chronic diarrhea, Constipation, Difficulty Swallowing, Excessive gas, Gets full quickly at meals, Hemorrhoids, Indigestion, Nausea, Rectal Pain and Vomiting. Female Genitourinary Not Present- Frequency, Nocturia, Painful Urination, Pelvic Pain and Urgency. Musculoskeletal Not Present- Back Pain, Joint Pain, Joint Stiffness, Muscle Pain, Muscle Weakness and Swelling of Extremities. Neurological Not Present- Decreased Memory, Fainting, Headaches, Numbness, Seizures, Tingling, Tremor, Trouble walking and Weakness. Psychiatric Not Present- Anxiety, Bipolar, Change in Sleep Pattern, Depression, Fearful and Frequent crying. Endocrine Not Present- Cold Intolerance, Excessive Hunger, Hair Changes, Heat Intolerance, Hot flashes and New Diabetes. Hematology Not Present- Blood Thinners, Easy Bruising, Excessive bleeding, Gland problems, HIV and Persistent Infections. All other systems negative   Physical Exam (Mickell Birdwell A. Mishel Sans MD; 01/03/2019 2:17 PM)  General Mental Status-Alert. General Appearance-Consistent with stated age. Hydration-Well hydrated. Voice-Normal.  Head and Neck Head-normocephalic, atraumatic with no lesions or palpable masses. Trachea-midline. Thyroid Gland Characteristics - normal size and consistency.  Eye Eyeball -  Bilateral-Extraocular movements intact. Sclera/Conjunctiva - Bilateral-No scleral icterus.  Chest and Lung Exam Chest and lung exam reveals -quiet, even and easy respiratory effort with no use of accessory muscles and on auscultation, normal breath sounds, no adventitious sounds and normal vocal resonance. Inspection Chest Wall - Normal. Back - normal.  Breast Note: bruising right medial  breast with hematoma left breast normal no nipple discharge or mass bilaterally  Cardiovascular Cardiovascular examination reveals -normal heart sounds, regular rate and rhythm with no murmurs and normal pedal pulses bilaterally.  Neurologic Neurologic evaluation reveals -alert and oriented x 3 with no impairment of recent or remote memory. Mental Status-Normal.  Musculoskeletal Normal Exam - Left-Upper Extremity Strength Normal and Lower Extremity Strength Normal. Normal Exam - Right-Upper Extremity Strength Normal and Lower Extremity Strength Normal.  Lymphatic Head & Neck  General Head & Neck Lymphatics: Bilateral - Description - Normal. Axillary  General Axillary Region: Bilateral - Description - Normal. Tenderness - Non Tender.    Assessment & Plan (Tiffaney Heimann A. Tracyann Duffell MD; 01/03/2019 2:19 PM)  BREAST CANCER OF UPPER-INNER QUADRANT OF RIGHT FEMALE BREAST (C50.211) Impression: triple negative pt opted for right breast seed lumpectomy and SLN mapping with port placement after discussing all options from this to mastectomy with reconstruction and the need for a port Risk of lumpectomy include bleeding, infection, seroma, more surgery, use of seed/wire, wound care, cosmetic deformity and the need for other treatments, death , blood clots, death. Pt agrees to proceed. Risk of sentinel lymph node mapping include bleeding, infection, lymphedema, shoulder pain. stiffness, dye allergy. cosmetic deformity , blood clots, death, need for more surgery. Pt agrees to proceed. Pt requires port  placement for chemotherapy. Risk include bleeding, infection, pneumothorax, hemothorax, mediastinal injury, nerve injury , blood vessel injury, stroke, blood clots, death, migration. embolization and need for additional procedures. Pt agrees to proceed.  Current Plans You are being scheduled for surgery- Our schedulers will call you.  You should hear from our office's scheduling department within 5 working days about the location, date, and time of surgery. We try to make accommodations for patient's preferences in scheduling surgery, but sometimes the OR schedule or the surgeon's schedule prevents [place] from making those accommodations.  If you have not heard from our office [phone]) in 5 working days, call the office and ask for your surgeon's nurse.  If you have other questions about your diagnosis, plan, or surgery, call the office and ask for your surgeon's nurse.  Pt Education - [facility] Breast Cancer Information Given - [facility] "Breast Journey" Package We discussed the staging and pathophysiology of breast cancer. We discussed all of the different options for treatment for breast cancer including surgery, chemotherapy, radiation therapy, Herceptin, and antiestrogen therapy. We discussed a sentinel lymph node biopsy as she does not appear to having lymph node involvement right now. We discussed the performance of that with injection of radioactive tracer and blue dye. We discussed that she would have an incision underneath her axillary hairline. We discussed that there is a bout a 10-20% chance of having a positive node with a sentinel lymph node biopsy and we will await the permanent pathology to make any other first further decisions in terms of her treatment. One of these options might be to return to the operating room to perform an axillary lymph node dissection. We discussed about a 1-2% risk lifetime of chronic shoulder pain as well as lymphedema associated with a sentinel lymph node  biopsy. We discussed the options for treatment of the breast cancer which included lumpectomy versus a mastectomy. We discussed the performance of the lumpectomy with a wire placement. We discussed a 10-20% chance of a positive margin requiring reexcision in the operating room. We also discussed that she may need radiation therapy or antiestrogen therapy or both if she undergoes lumpectomy. We discussed the mastectomy  and the postoperative care for that as well. We discussed that there is no difference in her survival whether she undergoes lumpectomy with radiation therapy or antiestrogen therapy versus a mastectomy. There is a slight difference in the local recurrence rate being 3-5% with lumpectomy and about 1% with a mastectomy. We discussed the risks of operation including bleeding, infection, possible reoperation. She understands her further therapy will be based on what her stages at the time of her operation.  Pt Education - [facility] breast cancer surgery: discussed with patient and provided information. Pt Education - [facility] Breast Biopsy HCI: discussed with patient and provided information. Pt Education - [facility] Mastectomy HCI Pt Education - ABC (After Breast Cancer) Class Info: discussed with patient and provided information. Pt Education - [facility] Breast Pains Education

## 2019-01-03 NOTE — Patient Instructions (Signed)

## 2019-01-03 NOTE — Progress Notes (Signed)
REFERRING PROVIDER: Nicholas Lose, MD [address] [place],  [place] [address]  PRIMARY PROVIDER:  [name], MD  PRIMARY REASON FOR VISIT:  1. Malignant neoplasm of upper-inner quadrant of right breast in female, estrogen receptor negative ([place])   2. Family history of breast cancer   3. Family history of prostate cancer     I connected with Kathleen Potter on 01/03/2019 at 3:20 pm EDT by Webex video conference and verified that I am speaking with the correct person using two identifiers.   Patient location: clinic Provider location: clinic    HISTORY OF PRESENT ILLNESS:   Kathleen Potter, a 63 y.o. female, was seen for a [place] cancer genetics consultation at the request of Dr. Lindi Adie due to a personal and family history of breast cancer and a family history of prostate cancer.  Kathleen Potter presents to clinic today to discuss the possibility of a hereditary predisposition to cancer, genetic testing, and to further clarify her future cancer risks, as well as potential cancer risks for family members.   In 2020, at the age of 26, Kathleen Potter was diagnosed with IDC, ER-/PR-/Her2-, of the right breast. The treatment plan includes surgery, chemotherapy, and radiation therapy.   CANCER HISTORY:  Oncology History  Malignant neoplasm of upper-inner quadrant of right breast in female, estrogen receptor negative ([place])  01/02/2019 Initial Diagnosis   Routine screening mammogram detected a 1.2cm mass in the right breast at the 12:30 location 7 cm from the nipple, no axillary adenopathy. Biopsy showed IDC, grade 2-3, HER-2 - (1+), ER -, PR -, Ki67 80%.    01/03/2019 Cancer Staging   Staging form: Breast, AJCC 8th Edition - Clinical stage from 01/03/2019: Stage IB (cT1c, cN0, cM0, G3, ER-, PR-, HER2-) - Signed by Nicholas Lose, MD on 01/03/2019      RISK FACTORS:  Menarche was at age 81.  First live birth at age 17.  OCP use for approximately 8 years.  Ovaries intact: yes.   Hysterectomy: yes.  Menopausal status: postmenopausal, 40.  HRT use: 0 years. Colonoscopy: yes; normal per patient. Mammogram within the last year: yes. Number of breast biopsies: 1.  Past Medical History:  Diagnosis Date  . Family history of breast cancer   . Family history of prostate cancer     Past Surgical History:  Procedure Laterality Date  . BREAST BIOPSY Right   . PARTIAL HYSTERECTOMY      Social History   Socioeconomic History  . Marital status: Single    Spouse name: Not on file  . Number of children: Not on file  . Years of education: Not on file  . Highest education level: Not on file  Occupational History  . Not on file  Social Needs  . Financial resource strain: Not on file  . Food insecurity    Worry: Not on file    Inability: Not on file  . Transportation needs    Medical: Not on file    Non-medical: Not on file  Tobacco Use  . Smoking status: Never Smoker  Substance and Sexual Activity  . Alcohol use: Not Currently  . Drug use: Never  . Sexual activity: Not on file  Lifestyle  . Physical activity    Days per week: Not on file    Minutes per session: Not on file  . Stress: Not on file  Relationships  . Social Herbalist on phone: Not on file    Gets together: Not  on file    Attends religious service: Not on file    Active member of club or organization: Not on file    Attends meetings of clubs or organizations: Not on file    Relationship status: Not on file  Other Topics Concern  . Not on file  Social History Narrative  . Not on file     FAMILY HISTORY:  We obtained a detailed, 4-generation family history.  Significant diagnoses are listed below: Family History  Problem Relation Age of Onset  . Breast cancer Sister 2  . Prostate cancer Paternal Uncle        dx. early-mid 83s, not metastatic    Kathleen Potter has one son who is 41. She has three sisters, one of whom had breast cancer at the age of 73 or 70.   Ms.  Potter' mother died when she was 31. She had two maternal uncles and one maternal aunt. There are no known diagnoses of cancer on her maternal side of the family.  Kathleen Potter' father died when he was 28. She had seven paternal uncles, only one of whom is still living, and she had no paternal aunts. One paternal uncle, Kerry Dory, had prostate cancer diagnosed in his early-mid 56s. His prostate cancer was not metastatic. Ms. Siple does not know of any other diagnoses of cancer on her paternal side of the family.  Ms. Rutt is unaware of previous family history of genetic testing for hereditary cancer risks, although her sister is currently considering genetic testing for her personal and family history of breast cancer. Patient's maternal ancestors are of African American descent, and paternal ancestors are of African American descent. There is no reported Ashkenazi Jewish ancestry. There is no known consanguinity.  GENETIC COUNSELING ASSESSMENT: Ms. Montella is a 63 y.o. female with a personal and family history of breast cancer and family history of prostate cancer, which is somewhat suggestive of a hereditary cancer syndrome and predisposition to cancer. We, therefore, discussed and recommended the following at today's visit.   DISCUSSION: We discussed that 5 - 10% of breast cancer is hereditary, with most cases associated with BRCA1/2.  There are other genes that can be associated with hereditary breast cancer syndromes.  These include ATM, CHEK2, PALB2, etc.  We discussed that testing is beneficial for several reasons including knowing about other cancer risks, identifying potential screening and risk-reduction options that may be appropriate, and to understand if other family members could be at risk for cancer and allow them to undergo genetic testing.   We reviewed the characteristics, features and inheritance patterns of hereditary cancer syndromes. We also discussed genetic testing,  including the appropriate family members to test, the process of testing, insurance coverage and turn-around-time for results.   We discussed with Kathleen Potter that the personal and family history does not meet insurance or NCCN criteria for genetic testing and is not highly consistent with a familial hereditary cancer syndrome.  We feel she is at low risk to harbor a gene mutation associated with such a condition.  Kathleen Potter is still interested in potentially moving forward with genetic testing and feels that she would benefit from having this knowledge.  She plans on discussing this with her sister, who's doctor also recently mentioned the possibility of genetic testing.  If Kathleen Potter does choose to pursue genetic testing, her out of pocket cost will be $250.  Our contact information was provided so that we can coordinate genetic testing if she chooses  to move forward.  In the meantime, we recommended that Kathleen Potter continue to follow the cancer screening guidelines given by her primary healthcare provider.  PLAN: After considering the risks, benefits, and limitations, Kathleen Potter has declined genetic testing at this time. She will discuss genetic testing with her sister and let us know if she wants to move forward with genetic testing at a later date.  We understand this decision and remain available to coordinate genetic testing at any time in the future. We, therefore, recommend Kathleen Potter continue to follow the cancer screening guidelines given by her primary healthcare provider.  Kathleen Potter questions were answered to her satisfaction today. Our contact information was provided should additional questions or concerns arise. Thank you for the referral and allowing [place] to share in the care of your patient.   Clint Guy, MS Genetic Counselor [email or web address] Phone: [phone]   The patient was seen for a total of 15 minutes in face-to-face genetic counseling.  This  patient was discussed with Drs. Magrinat, Lindi Adie and/or Burr Medico who agrees with the above.    _______________________________________________________________________ For Office Staff:  Number of people involved in session: 1 Was an Intern/ student involved with case: no

## 2019-01-09 ENCOUNTER — Other Ambulatory Visit: Payer: BC Managed Care – PPO

## 2019-01-09 ENCOUNTER — Ambulatory Visit (HOSPITAL_COMMUNITY)
Admission: RE | Admit: 2019-01-09 | Discharge: 2019-01-09 | Disposition: A | Discharge status: Home / Self Care | Payer: BC Managed Care – PPO | Source: Ambulatory Visit | Attending: Hematology and Oncology | Admitting: Hematology and Oncology

## 2019-01-09 ENCOUNTER — Other Ambulatory Visit: Payer: Self-pay

## 2019-01-09 DIAGNOSIS — C50211 Malignant neoplasm of upper-inner quadrant of right female breast: Secondary | ICD-10-CM | POA: Insufficient documentation

## 2019-01-09 DIAGNOSIS — Z171 Estrogen receptor negative status [ER-]: Secondary | ICD-10-CM | POA: Diagnosis not present

## 2019-01-09 NOTE — Progress Notes (Signed)
  Echocardiogram 2D Echocardiogram has been performed.  Bobbye Charleston 01/09/2019, 11:55 AM

## 2019-01-10 ENCOUNTER — Telehealth: Payer: Self-pay

## 2019-01-10 NOTE — Telephone Encounter (Signed)
Nutrition  Patient with new diagnosis of breast cancer and attended breast clinic on 01/03/2019.  Nurse navigator gave patient nutrition packet.  Tried to call patient 2 different times today to follow-up and introduce self and service at [facility].  No answer and voicemail box has not been set up.    Patient has contact information included in packet.  Patient can reach out to RD or MD can refer for patient questions or concerns.  Autumm Hattery B. Zenia Resides, [place], [place] Registered Dietitian [phone] (pager)

## 2019-01-11 ENCOUNTER — Telehealth: Payer: Self-pay | Admitting: *Deleted

## 2019-01-11 NOTE — Telephone Encounter (Signed)
Attempted to call patient to follow up from [facility].  No answer and her voicemail had not been set up.

## 2019-01-19 ENCOUNTER — Telehealth: Payer: Self-pay | Admitting: Hematology and Oncology

## 2019-01-19 NOTE — Telephone Encounter (Signed)
Called pt per 8/27 sch message - unable to reach pt and unable to leave message - no vmail set up .   Scheduled apt and mailed letter with appt date and time

## 2019-01-22 ENCOUNTER — Encounter: Payer: Self-pay | Admitting: *Deleted

## 2019-01-23 ENCOUNTER — Other Ambulatory Visit: Payer: Self-pay | Admitting: Surgery

## 2019-01-23 DIAGNOSIS — C50911 Malignant neoplasm of unspecified site of right female breast: Secondary | ICD-10-CM

## 2019-01-24 ENCOUNTER — Telehealth: Payer: Self-pay | Admitting: Hematology and Oncology

## 2019-01-24 NOTE — Telephone Encounter (Signed)
I talk with patient regarding reschedule 10/1 to 10/16

## 2019-01-25 ENCOUNTER — Encounter: Payer: Self-pay | Admitting: *Deleted

## 2019-01-26 ENCOUNTER — Telehealth: Payer: Self-pay | Admitting: Hematology and Oncology

## 2019-01-26 NOTE — Telephone Encounter (Signed)
Scheduled appt per 9/03 sch message - pt aware of appt date and time change

## 2019-02-16 ENCOUNTER — Encounter: Payer: Self-pay | Admitting: *Deleted

## 2019-02-22 ENCOUNTER — Ambulatory Visit: Payer: BC Managed Care – PPO | Admitting: Hematology and Oncology

## 2019-02-22 NOTE — Progress Notes (Signed)
[id number], [place] - [address] [place] [address] Phone: [phone] Fax: [phone]      Your procedure is scheduled on March 01, 2019.  Report to [facility] Main Entrance "A" at 05:30 A.M., and check in at the Admitting office.  Call this number if you have problems the morning of surgery:  [phone]  Call [phone] if you have any questions prior to your surgery date Monday-Friday 8am-4pm    Remember:  Do not eat after midnight the night before your surgery  You may drink clear liquids until 04:30am the morning of your surgery.   Clear liquids allowed are: Water, Non-Citrus Juices (without pulp), Carbonated Beverages, Clear Tea, Black Coffee Only, and Gatorade  **Please complete your PRE-SURGERY ENSURE that was provided before you by 4:30am the morning of surgery.  Please, if able, drink it in one setting. DO NOT SIP.**    Take these medicines the morning of surgery with A SIP OF WATER : NONE  7 days prior to surgery STOP taking any Aspirin (unless otherwise instructed by your surgeon), Aleve, Naproxen, Ibuprofen, Motrin, Advil, Goody's, BC's, all herbal medications, fish oil, and all vitamins.    The Morning of Surgery  Do not wear jewelry, make-up or nail polish.  Do not wear lotions, powders, or perfumes, or deodorant  Do not shave 48 hours prior to surgery.   Do not bring valuables to the hospital.  [facility] is not responsible for any belongings or valuables.  If you are a smoker, DO NOT Smoke 24 hours prior to surgery IF you wear a CPAP at night please bring your mask, tubing, and machine the morning of surgery   Remember that you must have someone to transport you home after your surgery, and remain with you for 24 hours if you are discharged the same day.   Contacts, glasses, hearing aids, dentures or bridgework may not be worn into surgery.    Leave your suitcase in the car.  After surgery it may be  brought to your room.  For patients admitted to the hospital, discharge time will be determined by your treatment team.  Patients discharged the day of surgery will not be allowed to drive home.    Special instructions:   [facility]- Preparing For Surgery  Before surgery, you can play an important role. Because skin is not sterile, your skin needs to be as free of germs as possible. You can reduce the number of germs on your skin by washing with CHG (chlorahexidine gluconate) Soap before surgery.  CHG is an antiseptic cleaner which kills germs and bonds with the skin to continue killing germs even after washing.    Oral Hygiene is also important to reduce your risk of infection.  Remember - BRUSH YOUR TEETH THE MORNING OF SURGERY WITH YOUR REGULAR TOOTHPASTE  Please do not use if you have an allergy to CHG or antibacterial soaps. If your skin becomes reddened/irritated stop using the CHG.  Do not shave (including legs and underarms) for at least 48 hours prior to first CHG shower. It is OK to shave your face.  Please follow these instructions carefully.   1. Shower the NIGHT BEFORE SURGERY and the MORNING OF SURGERY with CHG Soap.   2. If you chose to wash your hair, wash your hair first as usual with your normal shampoo.  3. After you shampoo, rinse your hair and body thoroughly to remove the shampoo.  4. Use  CHG as you would any other liquid soap. You can apply CHG directly to the skin and wash gently with a scrungie or a clean washcloth.   5. Apply the CHG Soap to your body ONLY FROM THE NECK DOWN.  Do not use on open wounds or open sores. Avoid contact with your eyes, ears, mouth and genitals (private parts). Wash Face and genitals (private parts)  with your normal soap.   6. Wash thoroughly, paying special attention to the area where your surgery will be performed.  7. Thoroughly rinse your body with warm water from the neck down.  8. DO NOT shower/wash with your normal soap  after using and rinsing off the CHG Soap.  9. Pat yourself dry with a CLEAN TOWEL.  10. Wear CLEAN PAJAMAS to bed the night before surgery, wear comfortable clothes the morning of surgery  11. Place CLEAN SHEETS on your bed the night of your first shower and DO NOT SLEEP WITH PETS.    Day of Surgery:  Do not apply any deodorants/lotions. Please shower the morning of surgery with the CHG soap  Please wear clean clothes to the hospital/surgery center.   Remember to brush your teeth WITH YOUR REGULAR TOOTHPASTE.   Please read over the following fact sheets that you were given.

## 2019-02-23 ENCOUNTER — Other Ambulatory Visit: Payer: Self-pay

## 2019-02-23 ENCOUNTER — Encounter (HOSPITAL_COMMUNITY): Payer: Self-pay

## 2019-02-23 ENCOUNTER — Encounter (HOSPITAL_COMMUNITY)
Admission: RE | Admit: 2019-02-23 | Discharge: 2019-02-23 | Disposition: A | Discharge status: Home / Self Care | Payer: BC Managed Care – PPO | Source: Ambulatory Visit | Attending: Surgery | Admitting: Surgery

## 2019-02-23 DIAGNOSIS — C50911 Malignant neoplasm of unspecified site of right female breast: Secondary | ICD-10-CM | POA: Insufficient documentation

## 2019-02-23 DIAGNOSIS — Z01818 Encounter for other preprocedural examination: Secondary | ICD-10-CM | POA: Insufficient documentation

## 2019-02-23 HISTORY — DX: Essential (primary) hypertension: I10

## 2019-02-23 LAB — CBC WITH DIFFERENTIAL/PLATELET
Abs Immature Granulocytes: 0.04 10*3/uL (ref 0.00–0.07)
Basophils Absolute: 0 10*3/uL (ref 0.0–0.1)
Basophils Relative: 1 %
Eosinophils Absolute: 0 10*3/uL (ref 0.0–0.5)
Eosinophils Relative: 1 %
HCT: 41.3 % (ref 36.0–46.0)
Hemoglobin: 12.3 g/dL (ref 12.0–15.0)
Immature Granulocytes: 1 %
Lymphocytes Relative: 44 %
Lymphs Abs: 2.2 10*3/uL (ref 0.7–4.0)
MCH: 22.9 pg — ABNORMAL LOW (ref 26.0–34.0)
MCHC: 29.8 g/dL — ABNORMAL LOW (ref 30.0–36.0)
MCV: 76.9 fL — ABNORMAL LOW (ref 80.0–100.0)
Monocytes Absolute: 0.4 10*3/uL (ref 0.1–1.0)
Monocytes Relative: 7 %
Neutro Abs: 2.4 10*3/uL (ref 1.7–7.7)
Neutrophils Relative %: 46 %
Platelets: 195 10*3/uL (ref 150–400)
RBC: 5.37 MIL/uL — ABNORMAL HIGH (ref 3.87–5.11)
RDW: 16.3 % — ABNORMAL HIGH (ref 11.5–15.5)
WBC: 5 10*3/uL (ref 4.0–10.5)
nRBC: 0 % (ref 0.0–0.2)

## 2019-02-23 LAB — COMPREHENSIVE METABOLIC PANEL
ALT: 17 U/L (ref 0–44)
AST: 23 U/L (ref 15–41)
Albumin: 3.5 g/dL (ref 3.5–5.0)
Alkaline Phosphatase: 66 U/L (ref 38–126)
Anion gap: 13 (ref 5–15)
BUN: 14 mg/dL (ref 8–23)
CO2: 18 mmol/L — ABNORMAL LOW (ref 22–32)
Calcium: 8.7 mg/dL — ABNORMAL LOW (ref 8.9–10.3)
Chloride: 110 mmol/L (ref 98–111)
Creatinine, Ser: 0.99 mg/dL (ref 0.44–1.00)
GFR calc Af Amer: >60 mL/min (ref >60)
GFR calc non Af Amer: >60 mL/min (ref >60)
Glucose, Bld: 101 mg/dL — ABNORMAL HIGH (ref 70–99)
Potassium: 4.6 mmol/L (ref 3.5–5.1)
Sodium: 141 mmol/L (ref 135–145)
Total Bilirubin: 0.9 mg/dL (ref 0.3–1.2)
Total Protein: 6.8 g/dL (ref 6.5–8.1)

## 2019-02-23 NOTE — Progress Notes (Signed)
Pt had forgotten about her pre-admission appointment. Instructions given through phone call. Pt will stop by later on, to get her vital signs checked , labs drawn, pick up her Pre-surgery drink and printed instructions.  Pt also stated she does have an appointment scheduled for seed placement but was unable to recall the exact day.  Coronavirus Screening Scheduled for Covid test on Monday No sx of cough,fever,runny nose, loss of smell or taste. Not exposed to anyone with COVID. PCP - Dr [name]  Cardiologist - denies  Oncology- Dr Nicholas Lose  Chest x-ray -NA   EKG - today. Pt stated her BP has been running high lately although she has not been to see her PCP for some time.  Stress Test - denies  ECHO -01-09-19  Cardiac Cath -denies   AICD-denies PM-denies LOOP-denies  Sleep Study - denies CPAP - NA  LABS-CBC diff, CMP  ASA-denies  ERASPre-surgery Ensure with instructions.  HA1C-denies Fasting Blood Sugar -  Checks Blood Sugar _____ times a day  Anesthesia-N  Pt denies having chest pain, sob, or fever at this time. All instructions explained to the pt, with a verbal understanding of the material. Pt agrees to go over the instructions while at home for a better understanding. Pt also instructed to self quarantine after being tested for COVID-19. The opportunity to ask questions was provided.

## 2019-02-26 ENCOUNTER — Other Ambulatory Visit (HOSPITAL_COMMUNITY)
Admission: RE | Admit: 2019-02-26 | Discharge: 2019-02-26 | Disposition: A | Discharge status: Home / Self Care | Payer: BC Managed Care – PPO | Source: Ambulatory Visit | Attending: Surgery | Admitting: Surgery

## 2019-02-26 DIAGNOSIS — C50911 Malignant neoplasm of unspecified site of right female breast: Secondary | ICD-10-CM | POA: Insufficient documentation

## 2019-02-26 DIAGNOSIS — Z01812 Encounter for preprocedural laboratory examination: Secondary | ICD-10-CM | POA: Diagnosis present

## 2019-02-26 DIAGNOSIS — Z20828 Contact with and (suspected) exposure to other viral communicable diseases: Secondary | ICD-10-CM | POA: Diagnosis not present

## 2019-02-26 LAB — SARS CORONAVIRUS 2 (TAT 6-24 HRS): SARS Coronavirus 2: NEGATIVE

## 2019-02-28 ENCOUNTER — Ambulatory Visit
Admission: RE | Admit: 2019-02-28 | Discharge: 2019-02-28 | Disposition: A | Discharge status: Home / Self Care | Payer: BC Managed Care – PPO | Source: Ambulatory Visit | Attending: Surgery | Admitting: Surgery

## 2019-02-28 ENCOUNTER — Other Ambulatory Visit: Payer: Self-pay

## 2019-02-28 DIAGNOSIS — C50911 Malignant neoplasm of unspecified site of right female breast: Secondary | ICD-10-CM

## 2019-02-28 MED ORDER — DEXTROSE 5 % IV SOLN
3.0000 g | INTRAVENOUS | Status: AC
  Administered 2019-03-01: 3 g via INTRAVENOUS
  Filled 2019-02-28: qty 3000
  Filled 2019-02-28: qty 3

## 2019-03-01 ENCOUNTER — Ambulatory Visit (HOSPITAL_COMMUNITY): Payer: BC Managed Care – PPO | Admitting: Anesthesiology

## 2019-03-01 ENCOUNTER — Encounter (HOSPITAL_COMMUNITY)
Admission: RE | Disposition: A | Discharge status: Home / Self Care | Payer: Self-pay | Source: Home / Self Care | Attending: Surgery

## 2019-03-01 ENCOUNTER — Ambulatory Visit (HOSPITAL_COMMUNITY): Payer: BC Managed Care – PPO

## 2019-03-01 ENCOUNTER — Ambulatory Visit (HOSPITAL_COMMUNITY)
Admission: RE | Admit: 2019-03-01 | Discharge: 2019-03-01 | Disposition: A | Discharge status: Home / Self Care | Payer: BC Managed Care – PPO | Attending: Surgery | Admitting: Surgery

## 2019-03-01 ENCOUNTER — Ambulatory Visit (HOSPITAL_COMMUNITY)
Admission: RE | Admit: 2019-03-01 | Discharge: 2019-03-01 | Disposition: A | Discharge status: Home / Self Care | Payer: BC Managed Care – PPO | Source: Ambulatory Visit | Attending: Surgery | Admitting: Surgery

## 2019-03-01 ENCOUNTER — Ambulatory Visit
Admission: RE | Admit: 2019-03-01 | Discharge: 2019-03-01 | Disposition: A | Discharge status: Home / Self Care | Payer: BC Managed Care – PPO | Source: Ambulatory Visit | Attending: Surgery | Admitting: Surgery

## 2019-03-01 ENCOUNTER — Encounter (HOSPITAL_COMMUNITY): Payer: Self-pay

## 2019-03-01 DIAGNOSIS — Z171 Estrogen receptor negative status [ER-]: Secondary | ICD-10-CM | POA: Insufficient documentation

## 2019-03-01 DIAGNOSIS — Z803 Family history of malignant neoplasm of breast: Secondary | ICD-10-CM | POA: Insufficient documentation

## 2019-03-01 DIAGNOSIS — C50911 Malignant neoplasm of unspecified site of right female breast: Secondary | ICD-10-CM

## 2019-03-01 DIAGNOSIS — Z95828 Presence of other vascular implants and grafts: Secondary | ICD-10-CM

## 2019-03-01 DIAGNOSIS — C50211 Malignant neoplasm of upper-inner quadrant of right female breast: Secondary | ICD-10-CM | POA: Diagnosis not present

## 2019-03-01 HISTORY — PX: BREAST LUMPECTOMY WITH RADIOACTIVE SEED AND SENTINEL LYMPH NODE BIOPSY: SHX6550

## 2019-03-01 HISTORY — PX: SENTINEL NODE BIOPSY: SHX6608

## 2019-03-01 HISTORY — PX: PORTACATH PLACEMENT: SHX2246

## 2019-03-01 SURGERY — BREAST LUMPECTOMY WITH RADIOACTIVE SEED AND SENTINEL LYMPH NODE BIOPSY
Anesthesia: Regional | Site: Chest | Laterality: Right

## 2019-03-01 MED ORDER — SODIUM CHLORIDE 0.9 % IV SOLN
INTRAVENOUS | Status: DC | PRN
  Administered 2019-03-01: 500 mL

## 2019-03-01 MED ORDER — ROCURONIUM BROMIDE 50 MG/5ML IV SOSY
PREFILLED_SYRINGE | INTRAVENOUS | Status: DC | PRN
  Administered 2019-03-01: 50 mg via INTRAVENOUS

## 2019-03-01 MED ORDER — ACETAMINOPHEN 500 MG PO TABS
1000.0000 mg | ORAL_TABLET | ORAL | Status: AC
  Administered 2019-03-01: 07:00:00 1000 mg via ORAL

## 2019-03-01 MED ORDER — MIDAZOLAM HCL 5 MG/5ML IJ SOLN
INTRAMUSCULAR | Status: DC | PRN
  Administered 2019-03-01 (×2): 1 mg via INTRAVENOUS

## 2019-03-01 MED ORDER — PROPOFOL 10 MG/ML IV BOLUS
INTRAVENOUS | Status: DC | PRN
  Administered 2019-03-01: 200 mg via INTRAVENOUS

## 2019-03-01 MED ORDER — LIDOCAINE 2% (20 MG/ML) 5 ML SYRINGE
INTRAMUSCULAR | Status: DC | PRN
  Administered 2019-03-01: 20 mg via INTRAVENOUS

## 2019-03-01 MED ORDER — PHENYLEPHRINE 40 MCG/ML (10ML) SYRINGE FOR IV PUSH (FOR BLOOD PRESSURE SUPPORT)
PREFILLED_SYRINGE | INTRAVENOUS | Status: AC
  Filled 2019-03-01: qty 10

## 2019-03-01 MED ORDER — HEPARIN SOD (PORK) LOCK FLUSH 100 UNIT/ML IV SOLN
INTRAVENOUS | Status: DC | PRN
  Administered 2019-03-01: 500 [IU]

## 2019-03-01 MED ORDER — BUPIVACAINE-EPINEPHRINE 0.25% -1:200000 IJ SOLN
INTRAMUSCULAR | Status: DC | PRN
  Administered 2019-03-01: 10 mL

## 2019-03-01 MED ORDER — OXYCODONE HCL 5 MG PO TABS: 5.0000 mg | ORAL_TABLET | Freq: Four times a day (QID) | ORAL | Status: DC | PRN

## 2019-03-01 MED ORDER — MIDAZOLAM HCL 2 MG/2ML IJ SOLN
INTRAMUSCULAR | Status: AC
  Filled 2019-03-01: qty 2

## 2019-03-01 MED ORDER — FENTANYL CITRATE (PF) 100 MCG/2ML IJ SOLN
INTRAMUSCULAR | Status: DC | PRN
  Administered 2019-03-01 (×4): 50 ug via INTRAVENOUS

## 2019-03-01 MED ORDER — IBUPROFEN 800 MG PO TABS: 800.0000 mg | ORAL_TABLET | Freq: Three times a day (TID) | ORAL | 0 refills | Status: DC | PRN

## 2019-03-01 MED ORDER — GABAPENTIN 300 MG PO CAPS
ORAL_CAPSULE | ORAL | Status: AC
  Administered 2019-03-01: 300 mg via ORAL
  Filled 2019-03-01: qty 1

## 2019-03-01 MED ORDER — DEXAMETHASONE SODIUM PHOSPHATE 10 MG/ML IJ SOLN
INTRAMUSCULAR | Status: AC
  Filled 2019-03-01: qty 1

## 2019-03-01 MED ORDER — ONDANSETRON HCL 4 MG/2ML IJ SOLN: 4.0000 mg | Freq: Once | INTRAMUSCULAR | Status: DC | PRN

## 2019-03-01 MED ORDER — HEPARIN SOD (PORK) LOCK FLUSH 100 UNIT/ML IV SOLN
INTRAVENOUS | Status: AC
  Filled 2019-03-01: qty 5

## 2019-03-01 MED ORDER — FENTANYL CITRATE (PF) 100 MCG/2ML IJ SOLN
INTRAMUSCULAR | Status: AC
  Filled 2019-03-01: qty 2

## 2019-03-01 MED ORDER — CHLORHEXIDINE GLUCONATE CLOTH 2 % EX PADS: 6.0000 | MEDICATED_PAD | Freq: Once | CUTANEOUS | Status: DC

## 2019-03-01 MED ORDER — SODIUM CHLORIDE (PF) 0.9 % IJ SOLN
INTRAMUSCULAR | Status: AC
  Filled 2019-03-01: qty 10

## 2019-03-01 MED ORDER — METHYLENE BLUE 0.5 % INJ SOLN
INTRAVENOUS | Status: AC
  Filled 2019-03-01: qty 10

## 2019-03-01 MED ORDER — ONDANSETRON HCL 4 MG/2ML IJ SOLN
INTRAMUSCULAR | Status: DC | PRN
  Administered 2019-03-01: 4 mg via INTRAVENOUS

## 2019-03-01 MED ORDER — FENTANYL CITRATE (PF) 100 MCG/2ML IJ SOLN
25.0000 ug | INTRAMUSCULAR | Status: DC | PRN
  Administered 2019-03-01: 50 ug via INTRAVENOUS

## 2019-03-01 MED ORDER — SUCCINYLCHOLINE CHLORIDE 20 MG/ML IJ SOLN
INTRAMUSCULAR | Status: DC | PRN
  Administered 2019-03-01: 140 mg via INTRAVENOUS

## 2019-03-01 MED ORDER — OXYCODONE HCL 5 MG PO TABS: 5.0000 mg | ORAL_TABLET | Freq: Four times a day (QID) | ORAL | 0 refills | Status: DC | PRN

## 2019-03-01 MED ORDER — LIDOCAINE 2% (20 MG/ML) 5 ML SYRINGE
INTRAMUSCULAR | Status: AC
  Filled 2019-03-01: qty 5

## 2019-03-01 MED ORDER — BUPIVACAINE-EPINEPHRINE (PF) 0.5% -1:200000 IJ SOLN
INTRAMUSCULAR | Status: DC | PRN
  Administered 2019-03-01: 30 mL

## 2019-03-01 MED ORDER — PROPOFOL 10 MG/ML IV BOLUS
INTRAVENOUS | Status: AC
  Filled 2019-03-01: qty 40

## 2019-03-01 MED ORDER — ONDANSETRON HCL 4 MG/2ML IJ SOLN
INTRAMUSCULAR | Status: AC
  Filled 2019-03-01: qty 2

## 2019-03-01 MED ORDER — 0.9 % SODIUM CHLORIDE (POUR BTL) OPTIME
TOPICAL | Status: DC | PRN
  Administered 2019-03-01: 1000 mL

## 2019-03-01 MED ORDER — BUPIVACAINE-EPINEPHRINE 0.25% -1:200000 IJ SOLN
INTRAMUSCULAR | Status: AC
  Filled 2019-03-01: qty 1

## 2019-03-01 MED ORDER — FENTANYL CITRATE (PF) 250 MCG/5ML IJ SOLN
INTRAMUSCULAR | Status: AC
  Filled 2019-03-01: qty 5

## 2019-03-01 MED ORDER — TECHNETIUM TC 99M SULFUR COLLOID FILTERED
1.0000 | Freq: Once | INTRAVENOUS | Status: AC | PRN
  Administered 2019-03-01: 1 via INTRADERMAL

## 2019-03-01 MED ORDER — DEXAMETHASONE SODIUM PHOSPHATE 10 MG/ML IJ SOLN
INTRAMUSCULAR | Status: DC | PRN
  Administered 2019-03-01: 5 mg via INTRAVENOUS

## 2019-03-01 MED ORDER — ROCURONIUM BROMIDE 10 MG/ML (PF) SYRINGE
PREFILLED_SYRINGE | INTRAVENOUS | Status: AC
  Filled 2019-03-01: qty 10

## 2019-03-01 MED ORDER — SUCCINYLCHOLINE CHLORIDE 200 MG/10ML IV SOSY
PREFILLED_SYRINGE | INTRAVENOUS | Status: AC
  Filled 2019-03-01: qty 10

## 2019-03-01 MED ORDER — ACETAMINOPHEN 500 MG PO TABS
ORAL_TABLET | ORAL | Status: AC
  Administered 2019-03-01: 1000 mg via ORAL
  Filled 2019-03-01: qty 2

## 2019-03-01 MED ORDER — SODIUM CHLORIDE 0.9 % IV SOLN
INTRAVENOUS | Status: AC
  Filled 2019-03-01: qty 1.2

## 2019-03-01 MED ORDER — PHENYLEPHRINE 40 MCG/ML (10ML) SYRINGE FOR IV PUSH (FOR BLOOD PRESSURE SUPPORT)
PREFILLED_SYRINGE | INTRAVENOUS | Status: DC | PRN
  Administered 2019-03-01: 80 ug via INTRAVENOUS

## 2019-03-01 MED ORDER — LACTATED RINGERS IV SOLN
INTRAVENOUS | Status: DC | PRN
  Administered 2019-03-01 (×2): via INTRAVENOUS

## 2019-03-01 MED ORDER — SUGAMMADEX SODIUM 200 MG/2ML IV SOLN
INTRAVENOUS | Status: DC | PRN
  Administered 2019-03-01: 240 mg via INTRAVENOUS

## 2019-03-01 MED ORDER — OXYCODONE HCL 5 MG PO TABS
ORAL_TABLET | ORAL | Status: AC
  Filled 2019-03-01: qty 1

## 2019-03-01 MED ORDER — GABAPENTIN 300 MG PO CAPS
300.0000 mg | ORAL_CAPSULE | ORAL | Status: AC
  Administered 2019-03-01: 07:00:00 300 mg via ORAL

## 2019-03-01 SURGICAL SUPPLY — 69 items
ADH SKN CLS APL DERMABOND .7 (GAUZE/BANDAGES/DRESSINGS) ×9
APL PRP STRL LF DISP 70% ISPRP (MISCELLANEOUS) ×6
APPLIER CLIP 9.375 MED OPEN (MISCELLANEOUS) ×5
APR CLP MED 9.3 20 MLT OPN (MISCELLANEOUS) ×3
BAG DECANTER FOR FLEXI CONT (MISCELLANEOUS) ×5 IMPLANT
BINDER BREAST LRG (GAUZE/BANDAGES/DRESSINGS) IMPLANT
BINDER BREAST XLRG (GAUZE/BANDAGES/DRESSINGS) IMPLANT
BINDER BREAST XXLRG (GAUZE/BANDAGES/DRESSINGS) ×2 IMPLANT
BLADE SURG 11 STRL SS (BLADE) ×2 IMPLANT
BLADE SURG 15 STRL LF DISP TIS (BLADE) IMPLANT
BLADE SURG 15 STRL SS (BLADE) ×5
CANISTER SUCT 3000ML PPV (MISCELLANEOUS) ×5 IMPLANT
CHLORAPREP W/TINT 26 (MISCELLANEOUS) ×7 IMPLANT
CLIP APPLIE 9.375 MED OPEN (MISCELLANEOUS) ×3 IMPLANT
CONT SPEC 4OZ CLIKSEAL STRL BL (MISCELLANEOUS) ×5 IMPLANT
COVER PROBE W GEL 5X96 (DRAPES) ×5 IMPLANT
COVER SURGICAL LIGHT HANDLE (MISCELLANEOUS) ×7 IMPLANT
COVER TRANSDUCER ULTRASND GEL (DRAPE) ×5 IMPLANT
COVER WAND RF STERILE (DRAPES) ×5 IMPLANT
DERMABOND ADVANCED (GAUZE/BANDAGES/DRESSINGS) ×6
DERMABOND ADVANCED .7 DNX12 (GAUZE/BANDAGES/DRESSINGS) ×3 IMPLANT
DEVICE DUBIN SPECIMEN MAMMOGRA (MISCELLANEOUS) ×5 IMPLANT
DRAPE C-ARM 42X72 X-RAY (DRAPES) ×5 IMPLANT
DRAPE CHEST BREAST 15X10 FENES (DRAPES) ×7 IMPLANT
ELECT CAUTERY BLADE 6.4 (BLADE) ×7 IMPLANT
ELECT REM PT RETURN 9FT ADLT (ELECTROSURGICAL) ×5
ELECTRODE REM PT RTRN 9FT ADLT (ELECTROSURGICAL) ×3 IMPLANT
GAUZE 4X4 16PLY RFD (DISPOSABLE) ×5 IMPLANT
GAUZE SPONGE 4X4 12PLY STRL (GAUZE/BANDAGES/DRESSINGS) ×2 IMPLANT
GEL ULTRASOUND 20GR AQUASONIC (MISCELLANEOUS) ×2 IMPLANT
GLOVE BIOGEL PI IND STRL 8 (GLOVE) ×3 IMPLANT
GLOVE BIOGEL PI INDICATOR 8 (GLOVE) ×6
GOWN STRL REUS W/ TWL LRG LVL3 (GOWN DISPOSABLE) ×3 IMPLANT
GOWN STRL REUS W/ TWL XL LVL3 (GOWN DISPOSABLE) ×3 IMPLANT
GOWN STRL REUS W/TWL LRG LVL3 (GOWN DISPOSABLE) ×5
GOWN STRL REUS W/TWL XL LVL3 (GOWN DISPOSABLE) ×5
INTRODUCER COOK 11FR (CATHETERS) IMPLANT
KIT BASIN OR (CUSTOM PROCEDURE TRAY) ×5 IMPLANT
KIT MARKER MARGIN INK (KITS) ×5 IMPLANT
KIT PORT POWER 8FR ISP CVUE (Port) ×2 IMPLANT
KIT TURNOVER KIT B (KITS) ×5 IMPLANT
LIGHT WAVEGUIDE WIDE FLAT (MISCELLANEOUS) IMPLANT
NDL 18GX1X1/2 (RX/OR ONLY) (NEEDLE) IMPLANT
NDL FILTER BLUNT 18X1 1/2 (NEEDLE) IMPLANT
NDL HYPO 25GX1X1/2 BEV (NEEDLE) ×3 IMPLANT
NEEDLE 18GX1X1/2 (RX/OR ONLY) (NEEDLE) IMPLANT
NEEDLE FILTER BLUNT 18X 1/2SAF (NEEDLE)
NEEDLE FILTER BLUNT 18X1 1/2 (NEEDLE) IMPLANT
NEEDLE HYPO 25GX1X1/2 BEV (NEEDLE) ×5 IMPLANT
NS IRRIG 1000ML POUR BTL (IV SOLUTION) ×5 IMPLANT
PACK GENERAL/GYN (CUSTOM PROCEDURE TRAY) ×5 IMPLANT
PAD ARMBOARD 7.5X6 YLW CONV (MISCELLANEOUS) ×5 IMPLANT
PENCIL BUTTON HOLSTER BLD 10FT (ELECTRODE) ×7 IMPLANT
POSITIONER HEAD DONUT 9IN (MISCELLANEOUS) ×5 IMPLANT
SET INTRODUCER 12FR PACEMAKER (INTRODUCER) IMPLANT
SET SHEATH INTRODUCER 10FR (MISCELLANEOUS) IMPLANT
SHEATH COOK PEEL AWAY SET 9F (SHEATH) IMPLANT
SUT MNCRL AB 4-0 PS2 18 (SUTURE) ×5 IMPLANT
SUT PROLENE 2 0 SH 30 (SUTURE) ×5 IMPLANT
SUT VIC AB 3-0 SH 18 (SUTURE) ×5 IMPLANT
SUT VIC AB 3-0 SH 27 (SUTURE) ×5
SUT VIC AB 3-0 SH 27X BRD (SUTURE) ×3 IMPLANT
SYR 10ML LL (SYRINGE) ×2 IMPLANT
SYR 5ML LUER SLIP (SYRINGE) ×5 IMPLANT
SYR CONTROL 10ML LL (SYRINGE) ×5 IMPLANT
TOWEL GREEN STERILE (TOWEL DISPOSABLE) ×5 IMPLANT
TOWEL GREEN STERILE FF (TOWEL DISPOSABLE) ×5 IMPLANT
TRAY LAPAROSCOPIC MC (CUSTOM PROCEDURE TRAY) ×5 IMPLANT
TUBING BULK SUCTION (MISCELLANEOUS) ×2 IMPLANT

## 2019-03-01 NOTE — Interval H&P Note (Signed)
History and Physical Interval Note:  03/01/2019 7:22 AM  Kathleen Potter Kathleen Potter  has presented today for surgery, with the diagnosis of right breast cancer.  The various methods of treatment have been discussed with the patient and family. After consideration of risks, benefits and other options for treatment, the patient has consented to  Procedure(s): RIGHT BREAST LUMPECTOMY WITH RADIOACTIVE SEED AND RIGHT  SENTINEL LYMPH NODE BIOPSY (Right) INSERTION PORT-A-CATH WITH ULTRASOUND (N/A) as a surgical intervention.  The patient's history has been reviewed, patient examined, no change in status, stable for surgery.  I have reviewed the patient's chart and labs.  Questions were answered to the patient's satisfaction.     [place]

## 2019-03-01 NOTE — Transfer of Care (Signed)
Immediate Anesthesia Transfer of Care Note  Patient: Camelle Staudacher  Procedure(s) Performed: RIGHT BREAST LUMPECTOMY WITH RADIOACTIVE SEED (Right Breast) INSERTION PORT-A-CATH WITH ULTRASOUND (N/A Chest) Right Sentinel Lymph Node Biopsy (Right Axilla)  Patient Location: PACU  Anesthesia Type:GA combined with regional for post-op pain  Level of Consciousness: awake, alert  and oriented  Airway & Oxygen Therapy: Patient Spontanous Breathing  Post-op Assessment: Report given to RN and Post -op Vital signs reviewed and stable  Post vital signs: Reviewed and stable  Last Vitals:  Vitals Value Taken Time  BP 148/77 03/01/19 0937  Temp    Pulse 72 03/01/19 0940  Resp 19 03/01/19 0940  SpO2 95 % 03/01/19 0940  Vitals shown include unvalidated device data.  Last Pain:  Vitals:   03/01/19 0935  PainSc: (P) 8          Complications: No apparent anesthesia complications

## 2019-03-01 NOTE — Op Note (Signed)
Preoperative diagnosis: Stage I right breast cancer  Postop diagnosis: Same  Procedure: Right breast seed lumpectomy with right axillary sentinel lymph node mapping and placement of right 8 French internal jugular Port-A-Cath with ultrasound and fluoroscopic guidance  Surgeon: Erroll Luna, MD  Anesthesia: General with pectoral block  EBL: 80 cc  Specimen: Right breast tissue with seed and clip to pathology verified by Faxitron and 2 right axillary sentinel nodes  Drains: None  IV fluids: Per anesthesia record.  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  She will require postoperative port placement and chemotherapy.  This was discussed as well.The procedure has been discussed with the patient. Alternatives to surgery have been discussed with the patient.  Risks of surgery include bleeding,  Infection,  Seroma formation, death,  and the need for further surgery.   The patient understands and wishes to proceed.Sentinel lymph node mapping and dissection has been discussed with the patient.  Risk of bleeding,  Infection,  Seroma formation,  Additional procedures,,  Shoulder weakness ,  Shoulder stiffness,  Nerve and blood vessel injury and reaction to the mapping dyes have been discussed.  Alternatives to surgery have been discussed with the patient.  The patient agrees to proceed.  Risk of bleeding, infection, pneumothorax, hemothorax, mediastinal injury, DVT, catheter infection, catheter migration, catheter fragmentation embolization.    Description of procedure: The patient was met in the holding area.  The right side was marked as correct and neoprobe used to verify seed location.  She underwent a pectoral block anesthesia and injection of technetium sulfur colloid radiology.  She was taken back the operative room.  She is placed supine upon the OR table.  After  induction of general esthesia the right upper chest and neck region were prepped draped sterile fashion.  Both arms were tucked and a roll was placed under her shoulders to expose her left neck.  The port was placed first.  After timeout was done ultrasound was used to locate the right internal jugular vein.  She was placed in Trendelenburg.  A needle was passed under ultrasound guidance into the vein with good return of dark nonpulsatile blood.  Wire was fed through this.  C-arm used to verify wire location going down the superior vena cava and inferior vena cava.  Small incision was made at the skin wire site.  Local anesthetic was infiltrated consisting of 0.5% Marcaine at this site and then down just below the right clavicle for creation of a pocket for the port.  Incision was made just below the clavicle 3 cm.  Dissection was done bluntly to the fascia of the pectoralis muscle.  A small pocket was created.  An 8 French [place] port was assembled and flushed.  I then tunneled the port from the lower incision to the incision where the wire exited.  She was placed in Trendelenburg and I passed the dilator and reduce the complex of the wire moving the wire to and fro without resistance.  I then removed the dilator and wire.  I fed the catheter through the peel-away sheath and peeled away without difficulty.  C-arm images revealed the tip to be in the junction of the SVC right atrium in the clinic.  She had no ectopy.  I pulled this back about a centimeter.  We then drew back on the port and drew back easily and was flushed with heparinized  saline.  5 cc of heparinized saline concentrate was placed in the port.  The port was secured to the chest wall with a single stitch of 2-0 Prolene.  Wounds were closed with 3-0 Vicryl and 4-0 Monocryl.  Dermabond applied.  This was taken down the patient was reprepped and redraped involving the right breast right axilla.  A second timeout was performed.  Neoprobe was used to  verify seed location in the upper inner quadrant.  Incision was made over this dissection was carried down to remove all tissue around the seed and clip with grossly negative margins.  Faxitron image revealed seed and clip in the specimen.  Cavities irrigated out and made hemostatic with cautery.  Clips were placed and the skin was closed with 3-0 Vicryl and 4-0 Monocryl.  The neoprobe settings were changed technetium.  A 4 cm incision was made the right axilla.  Hot spot identified in the level 1 node basin and dissection was carried down to that.  2 hot nodes removed and sent to pathology.  Background counts approached 0.  Long thoracic nerve, thoracodorsal trunk and axillary vein were preserved.  The wound was irrigated and found to be hemostatic.  Was closed with 3-0 Vicryl and 4-0 Monocryl.  Dermabond applied to both incisions.  All counts were found to be correct.  The patient was awoke extubated taken recovery in satisfactory condition.

## 2019-03-01 NOTE — Progress Notes (Signed)
Wasted 63mcg fentanyl in stericycle with Berneta Levins

## 2019-03-01 NOTE — Discharge Instructions (Signed)
[facility] Office Phone Number [phone]  BREAST BIOPSY/ PARTIAL MASTECTOMY: POST OP INSTRUCTIONS  Always review your discharge instruction sheet given to you by the facility where your surgery was performed.  IF YOU HAVE DISABILITY OR FAMILY LEAVE FORMS, YOU MUST BRING THEM TO THE OFFICE FOR PROCESSING.  DO NOT GIVE THEM TO YOUR DOCTOR.  1. A prescription for pain medication may be given to you upon discharge.  Take your pain medication as prescribed, if needed.  If narcotic pain medicine is not needed, then you may take acetaminophen (Tylenol) or ibuprofen (Advil) as needed. 2. Take your usually prescribed medications unless otherwise directed 3. If you need a refill on your pain medication, please contact your pharmacy.  They will contact our office to request authorization.  Prescriptions will not be filled after 5pm or on week-ends. 4. You should eat very light the first 24 hours after surgery, such as soup, crackers, pudding, etc.  Resume your normal diet the day after surgery. 5. Most patients will experience some swelling and bruising in the breast.  Ice packs and a good support bra will help.  Swelling and bruising can take several days to resolve.  6. It is common to experience some constipation if taking pain medication after surgery.  Increasing fluid intake and taking a stool softener will usually help or prevent this problem from occurring.  A mild laxative (Milk of Magnesia or Miralax) should be taken according to package directions if there are no bowel movements after 48 hours. 7. Unless discharge instructions indicate otherwise, you may remove your bandages 24-48 hours after surgery, and you may shower at that time.  You may have steri-strips (small skin tapes) in place directly over the incision.  These strips should be left on the skin for 7-10 days.  If your surgeon used skin glue on the incision, you may shower in 24 hours.  The glue will flake off over the  next 2-3 weeks.  Any sutures or staples will be removed at the office during your follow-up visit. 8. ACTIVITIES:  You may resume regular daily activities (gradually increasing) beginning the next day.  Wearing a good support bra or sports bra minimizes pain and swelling.  You may have sexual intercourse when it is comfortable. a. You may drive when you no longer are taking prescription pain medication, you can comfortably wear a seatbelt, and you can safely maneuver your car and apply brakes. b. RETURN TO WORK:  ______________________________________________________________________________________ 9. You should see your doctor in the office for a follow-up appointment approximately two weeks after your surgery.  Your doctor's nurse will typically make your follow-up appointment when she calls you with your pathology report.  Expect your pathology report 2-3 business days after your surgery.  You may call to check if you do not hear from us after three days. 10. OTHER INSTRUCTIONS: _______________________________________________________________________________________________ _____________________________________________________________________________________________________________________________________ _____________________________________________________________________________________________________________________________________ _____________________________________________________________________________________________________________________________________  WHEN TO CALL YOUR DOCTOR: 1. Fever over 101.0 2. Nausea and/or vomiting. 3. Extreme swelling or bruising. 4. Continued bleeding from incision. 5. Increased pain, redness, or drainage from the incision.  The clinic staff is available to answer your questions during regular business hours.  Please don't hesitate to call and ask to speak to one of the nurses for clinical concerns.  If you have a medical emergency, go to the nearest  emergency room or call 911.  A surgeon from [facility] is always on call at the hospital.  For further questions, please visit [email or web address]        PORT-A-CATH: POST OP INSTRUCTIONS  Always review your discharge instruction sheet given to you by the facility where your surgery was performed.   1. A prescription for pain medication may be given to you upon discharge. Take your pain medication as prescribed, if needed. If narcotic pain medicine is not needed, then you make take acetaminophen (Tylenol) or ibuprofen (Advil) as needed.  2. Take your usually prescribed medications unless otherwise directed. 3. If you need a refill on your pain medication, please contact our office. All narcotic pain medicine now requires a paper prescription.  Phoned in and fax refills are no longer allowed by law.  Prescriptions will not be filled after 5 pm or on weekends.  4. You should follow a light diet for the remainder of the day after your procedure. 5. Most patients will experience some mild swelling and/or bruising in the area of the incision. It may take several days to resolve. 6. It is common to experience some constipation if taking pain medication after surgery. Increasing fluid intake and taking a stool softener (such as Colace) will usually help or prevent this problem from occurring. A mild laxative (Milk of Magnesia or Miralax) should be taken according to package directions if there are no bowel movements after 48 hours.  7. Unless discharge instructions indicate otherwise, you may remove your bandages 48 hours after surgery, and you may shower at that time. You may have steri-strips (small white skin tapes) in place directly over the incision.  These strips should be left on the skin for 7-10 days.  If your surgeon used Dermabond (skin glue) on the incision, you may shower in 24 hours.  The glue will flake off over the next 2-3 weeks.  8. If your port is left accessed at the  end of surgery (needle left in port), the dressing cannot get wet and should only by changed by a healthcare professional. When the port is no longer accessed (when the needle has been removed), follow step 7.   9. ACTIVITIES:  Limit activity involving your arms for the next 72 hours. Do no strenuous exercise or activity for 1 week. You may drive when you are no longer taking prescription pain medication, you can comfortably wear a seatbelt, and you can maneuver your car. 10.You may need to see your doctor in the office for a follow-up appointment.  Please       check with your doctor.  11.When you receive a new Port-a-Cath, you will get a product guide and        ID card.  Please keep them in case you need them.  WHEN TO CALL YOUR DOCTOR ([phone]): 1. Fever over 101.0 2. Chills 3. Continued bleeding from incision 4. Increased redness and tenderness at the site 5. Shortness of breath, difficulty breathing   The clinic staff is available to answer your questions during regular business hours. Please don't hesitate to call and ask to speak to one of the nurses or medical assistants for clinical concerns. If you have a medical emergency, go to the nearest emergency room or call 911.  A surgeon from [facility] is always on call at the hospital.     For further information, please visit [email or web address]     

## 2019-03-01 NOTE — Anesthesia Procedure Notes (Signed)
Anesthesia Regional Block: Pectoralis block   Pre-Anesthetic Checklist: ,, timeout performed, Correct Patient, Correct Site, Correct Laterality, Correct Procedure, Correct Position, site marked, Risks and benefits discussed,  Surgical consent,  Pre-op evaluation,  At surgeon's request and post-op pain management  Laterality: Right  Prep: chloraprep       Needles:  Injection technique: Single-shot  Needle Type: Echogenic Needle     Needle Length: 9cm  Needle Gauge: 21     Additional Needles:   Procedures:,,,, ultrasound used (permanent image in chart),,,,  Narrative:  Start time: 03/01/2019 6:57 AM End time: 03/01/2019 7:04 AM Injection made incrementally with aspirations every 5 mL.  Performed by: Personally  Anesthesiologist: Suzette Battiest, MD

## 2019-03-01 NOTE — Anesthesia Preprocedure Evaluation (Signed)
Anesthesia Evaluation  Patient identified by MRN, date of birth, ID band Patient awake    Reviewed: Allergy & Precautions, NPO status , Patient's Chart, lab work & pertinent test results  Airway Mallampati: II  TM Distance: >3 FB     Dental  (+) Dental Advisory Given   Pulmonary neg pulmonary ROS,    breath sounds clear to auscultation       Cardiovascular hypertension,  Rhythm:Regular Rate:Normal     Neuro/Psych negative neurological ROS     GI/Hepatic negative GI ROS, Neg liver ROS,   Endo/Other  negative endocrine ROS  Renal/GU negative Renal ROS     Musculoskeletal   Abdominal   Peds  Hematology negative hematology ROS (+)   Anesthesia Other Findings   Reproductive/Obstetrics                             Lab Results  Component Value Date   WBC 5.0 02/23/2019   HGB 12.3 02/23/2019   HCT 41.3 02/23/2019   MCV 76.9 (L) 02/23/2019   PLT 195 02/23/2019   Lab Results  Component Value Date   CREATININE 0.99 02/23/2019   BUN 14 02/23/2019   NA 141 02/23/2019   K 4.6 02/23/2019   CL 110 02/23/2019   CO2 18 (L) 02/23/2019    Anesthesia Physical Anesthesia Plan  ASA: III  Anesthesia Plan: General   Post-op Pain Management:    Induction: Intravenous  PONV Risk Score and Plan: 3 and Dexamethasone, Ondansetron, Treatment may vary due to age or medical condition and Midazolam  Airway Management Planned: Oral ETT  Additional Equipment:   Intra-op Plan:   Post-operative Plan: Extubation in OR  Informed Consent: I have reviewed the patients History and Physical, chart, labs and discussed the procedure including the risks, benefits and alternatives for the proposed anesthesia with the patient or authorized representative who has indicated his/her understanding and acceptance.     Dental advisory given  Plan Discussed with: CRNA  Anesthesia Plan Comments:          Anesthesia Quick Evaluation

## 2019-03-01 NOTE — H&P (Signed)
Kathleen Potter  Location: [facility] Patient #: [id number] DOB: [birth year] Undefined / Language: Cleophus Molt / Race: Black or African American Female  History of Present Illness  Patient words: 63 YO female sent at the request of Dr Lindi Adie for SD mammographic 1.2 cm mass right breast upper inner quadrant. 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 No complaints of mass discharge or change in the appearance of her mammogram.  The patient is a 63 year old female.   Past Surgical History  Breast Biopsy Right. Hysterectomy (not due to cancer) - Partial Oral Surgery  Diagnostic Studies History  Colonoscopy >10 years ago Mammogram within last year Pap Smear >5 years ago  Medication History Medications Reconciled  Social History  No alcohol use No drug use Tobacco use Never smoker.  Family History  Alcohol Abuse Father. Arthritis Father. Breast Cancer Sister. Diabetes Mellitus Family Members In General, Father. Heart Disease Father. Hypertension Father. Kidney Disease Father. Respiratory Condition Father, Mother.  Pregnancy / Birth History  Age at menarche 84 years. Age of menopause 15-50 Gravida 1 Irregular periods Maternal age 7-20 Para 1  Other Problems  Hemorrhoids     Review of Systems  General Not Present- Appetite Loss, Chills, Fatigue, Fever, Night Sweats, Weight Gain and Weight Loss. Skin Not Present- Change in Wart/Mole, Dryness, Hives, Jaundice, New Lesions, Non-Healing Wounds, Rash and Ulcer. HEENT Not Present- Earache, Hearing Loss, Hoarseness, Nose Bleed, Oral Ulcers, Ringing in the Ears, Seasonal Allergies, Sinus Pain, Sore Throat, Visual Disturbances, Wears glasses/contact lenses and Yellow Eyes. Respiratory Not Present- Bloody sputum, Chronic Cough, Difficulty Breathing, Snoring and Wheezing. Breast Not Present- Breast Mass, Breast Pain, Nipple Discharge and  Skin Changes. Cardiovascular Not Present- Chest Pain, Difficulty Breathing Lying Down, Leg Cramps, Palpitations, Rapid Heart Rate, Shortness of Breath and Swelling of Extremities. Gastrointestinal Not Present- Abdominal Pain, Bloating, Bloody Stool, Change in Bowel Habits, Chronic diarrhea, Constipation, Difficulty Swallowing, Excessive gas, Gets full quickly at meals, Hemorrhoids, Indigestion, Nausea, Rectal Pain and Vomiting. Female Genitourinary Not Present- Frequency, Nocturia, Painful Urination, Pelvic Pain and Urgency. Musculoskeletal Not Present- Back Pain, Joint Pain, Joint Stiffness, Muscle Pain, Muscle Weakness and Swelling of Extremities. Neurological Not Present- Decreased Memory, Fainting, Headaches, Numbness, Seizures, Tingling, Tremor, Trouble walking and Weakness. Psychiatric Not Present- Anxiety, Bipolar, Change in Sleep Pattern, Depression, Fearful and Frequent crying. Endocrine Not Present- Cold Intolerance, Excessive Hunger, Hair Changes, Heat Intolerance, Hot flashes and New Diabetes. Hematology Not Present- Blood Thinners, Easy Bruising, Excessive bleeding, Gland problems, HIV and Persistent Infections. All other systems negative   Physical Exam   General Mental Status-Alert. General Appearance-Consistent with stated age. Hydration-Well hydrated. Voice-Normal.  Head and Neck Head-normocephalic, atraumatic with no lesions or palpable masses. Trachea-midline. Thyroid Gland Characteristics - normal size and consistency.  Eye Eyeball - Bilateral-Extraocular movements intact. Sclera/Conjunctiva - Bilateral-No scleral icterus.  Chest and Lung Exam Chest and lung exam reveals -quiet, even and easy respiratory effort with no use of accessory muscles and on auscultation, normal breath sounds, no adventitious sounds and normal vocal resonance. Inspection Chest Wall - Normal. Back - normal.  Breast Note: bruising right medial breast with  hematoma left breast normal no nipple discharge or mass bilaterally  Cardiovascular Cardiovascular examination reveals -normal heart sounds, regular rate and rhythm with no murmurs and normal pedal pulses bilaterally.  Neurologic Neurologic evaluation reveals -alert and oriented x 3 with no impairment of recent or remote memory. Mental Status-Normal.  Musculoskeletal Normal Exam -  Left-Upper Extremity Strength Normal and Lower Extremity Strength Normal. Normal Exam - Right-Upper Extremity Strength Normal and Lower Extremity Strength Normal.  Lymphatic Head & Neck  General Head & Neck Lymphatics: Bilateral - Description - Normal. Axillary  General Axillary Region: Bilateral - Description - Normal. Tenderness - Non Tender.    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 Pt agrees to proceed. Risk of sentinel lymph node mapping include bleeding, infection, lymphedema, shoulder pain. stiffness, dye allergy. cosmetic deformity , blood clots, death, need for more surgery. Pt agrees to proceed. Pt requires port placement for chemotherapy. Risk include bleeding, infection, pneumothorax, hemothorax, mediastinal injury, nerve injury , blood vessel injury, stroke, blood clots, death, migration. embolization and need for additional procedures. Pt agrees to proceed.  Current Plans You are being scheduled for surgery- Our schedulers will call you.  You should hear from our office's scheduling department within 5 working days about the location, date,  and time of surgery. We try to make accommodations for patient's preferences in scheduling surgery, but sometimes the OR schedule or the surgeon's schedule prevents [place] from making those accommodations.  If you have not heard from our office [phone]) in 5 working days, call the office and ask for your surgeon's nurse.  If you have other questions about your diagnosis, plan, or surgery, call the office and ask for your surgeon's nurse.  Pt Education - [facility] Breast Cancer Information Given - [employer] "Breast Journey" Package We discussed the staging and pathophysiology of breast cancer. We discussed all of the different options for treatment for breast cancer including surgery, chemotherapy, radiation therapy, Herceptin, and antiestrogen therapy. We discussed a sentinel lymph node biopsy as she does not appear to having lymph node involvement right now. We discussed the performance of that with injection of radioactive tracer and blue dye. We discussed that she would have an incision underneath her axillary hairline. We discussed that there is a bout a 10-20% chance of having a positive node with a sentinel lymph node biopsy and we will await the permanent pathology to make any other first further decisions in terms of her treatment. One of these options might be to return to the operating room to perform an axillary lymph node dissection. We discussed about a 1-2% risk lifetime of chronic shoulder pain as well as lymphedema associated with a sentinel lymph node biopsy. We discussed the options for treatment of the breast cancer which included lumpectomy versus a mastectomy. We discussed the performance of the lumpectomy with a wire placement. We discussed a 10-20% chance of a positive margin requiring reexcision in the operating room. We also discussed that she may need radiation therapy or antiestrogen therapy or both if she undergoes lumpectomy. We discussed the mastectomy and the postoperative care  for that as well. We discussed that there is no difference in her survival whether she undergoes lumpectomy with radiation therapy or antiestrogen therapy versus a mastectomy. There is a slight difference in the local recurrence rate being 3-5% with lumpectomy and about 1% with a mastectomy. We discussed the risks of operation including bleeding, infection, possible reoperation. She  understands her further therapy will be based on what her stages at the time of her operation.  Pt Education - flb breast cancer surgery: discussed with patient and provided information. Pt Education - [facility] Breast Biopsy HCI: discussed with patient and provided information. Pt Education - [facility] Mastectomy HCI Pt Education - ABC (After Breast Cancer) Class Info: discussed with patient and provided information. Pt Education - [facility] Breast Pains Education

## 2019-03-01 NOTE — Anesthesia Procedure Notes (Signed)
Procedure Name: Intubation Date/Time: 03/01/2019 7:43 AM Performed by: Trinna Post., CRNA Pre-anesthesia Checklist: Patient identified, Emergency Drugs available, Suction available, Patient being monitored and Timeout performed Patient Re-evaluated:Patient Re-evaluated prior to induction Oxygen Delivery Method: Circle system utilized Preoxygenation: Pre-oxygenation with 100% oxygen Induction Type: IV induction Ventilation: Mask ventilation without difficulty Laryngoscope Size: Mac and 4 Grade View: Grade I Tube type: Oral Tube size: 7.0 mm Number of attempts: 1 Airway Equipment and Method: Stylet Placement Confirmation: ETT inserted through vocal cords under direct vision,  positive ETCO2 and breath sounds checked- equal and bilateral Secured at: 21 cm Tube secured with: Tape Dental Injury: Teeth and Oropharynx as per pre-operative assessment

## 2019-03-02 ENCOUNTER — Other Ambulatory Visit: Payer: BC Managed Care – PPO

## 2019-03-02 ENCOUNTER — Encounter (HOSPITAL_COMMUNITY): Payer: Self-pay | Admitting: Surgery

## 2019-03-02 LAB — SURGICAL PATHOLOGY

## 2019-03-02 MED ORDER — ESMOLOL HCL 100 MG/10ML IV SOLN
INTRAVENOUS | Status: DC | PRN
  Administered 2019-03-01: 20 mg via INTRAVENOUS

## 2019-03-02 MED ORDER — METOPROLOL TARTRATE 5 MG/5ML IV SOLN
INTRAVENOUS | Status: DC | PRN
  Administered 2019-03-01: 2.5 mg via INTRAVENOUS

## 2019-03-02 NOTE — Addendum Note (Signed)
Addendum  created 03/02/19 1017 by Trinna Post., CRNA   Intraprocedure Meds edited

## 2019-03-02 NOTE — Anesthesia Postprocedure Evaluation (Signed)
Anesthesia Post Note  Patient: Kathleen Potter  Procedure(s) Performed: RIGHT BREAST LUMPECTOMY WITH RADIOACTIVE SEED (Right Breast) INSERTION PORT-A-CATH WITH ULTRASOUND (N/A Chest) Right Sentinel Lymph Node Biopsy (Right Axilla)     Patient location during evaluation: PACU Anesthesia Type: General Level of consciousness: awake and alert Pain management: pain level controlled Vital Signs Assessment: post-procedure vital signs reviewed and stable Respiratory status: spontaneous breathing, nonlabored ventilation, respiratory function stable and patient connected to nasal cannula oxygen Cardiovascular status: blood pressure returned to baseline and stable Postop Assessment: no apparent nausea or vomiting Anesthetic complications: no    Last Vitals:  Vitals:   03/01/19 1010 03/01/19 1026  BP: (!) 151/95 (!) 146/70  Pulse: (!) 57 60  Resp: 15 16  Temp:    SpO2: 96% 97%    Last Pain:  Vitals:   03/01/19 0955  PainSc: Asleep                 Tiajuana Amass

## 2019-03-05 ENCOUNTER — Encounter: Payer: Self-pay | Admitting: *Deleted

## 2019-03-07 NOTE — Progress Notes (Signed)
[facility] Psychosocial Distress Screening Clinical Social Work  Clinical Social Work was referred by distress screening protocol.  The patient scored a 8 on the Psychosocial Distress Thermometer which indicates severe distress. Counseling Intern contacted patient by phone to assess for distress and other psychosocial needs.   ONCBCN DISTRESS SCREENING 03/07/2019  Distress experienced in past week (1-10) 8  Emotional problem type Adjusting to illness  Information Concerns Type Lack of info about diagnosis;Lack of info about treatment  Referral to support programs Yes   Clinical Social Worker follow up needed: No.  The patient stated she did get my introduction letter and that she is "healing".  She declined the invitation to engage in counseling services at this time, but I will check-in with the patient via phone in 3 weeks to offer emotional and mental health support at that time.    Art Buff Duncan Counseling Intern Voicemail:  [phone]

## 2019-03-09 ENCOUNTER — Inpatient Hospital Stay: Payer: BC Managed Care – PPO | Attending: Hematology and Oncology | Admitting: Hematology and Oncology

## 2019-03-09 ENCOUNTER — Other Ambulatory Visit: Payer: Self-pay

## 2019-03-09 VITALS — BP 188/86 | HR 77 | Temp 98.0°F | Resp 17 | Ht 66.0 in | Wt 265.1 lb

## 2019-03-09 DIAGNOSIS — C50211 Malignant neoplasm of upper-inner quadrant of right female breast: Secondary | ICD-10-CM | POA: Diagnosis present

## 2019-03-09 DIAGNOSIS — Z171 Estrogen receptor negative status [ER-]: Secondary | ICD-10-CM | POA: Diagnosis not present

## 2019-03-09 MED ORDER — ONDANSETRON HCL 8 MG PO TABS: 8.0000 mg | ORAL_TABLET | Freq: Two times a day (BID) | ORAL | 1 refills | Status: DC | PRN

## 2019-03-09 MED ORDER — LIDOCAINE-PRILOCAINE 2.5-2.5 % EX CREA: TOPICAL_CREAM | CUTANEOUS | 3 refills | Status: DC

## 2019-03-09 MED ORDER — LORAZEPAM 0.5 MG PO TABS: 0.5000 mg | ORAL_TABLET | Freq: Every evening | ORAL | 0 refills | Status: DC | PRN

## 2019-03-09 MED ORDER — DEXAMETHASONE 4 MG PO TABS: ORAL_TABLET | ORAL | 0 refills | Status: DC

## 2019-03-09 MED ORDER — PROCHLORPERAZINE MALEATE 10 MG PO TABS: 10.0000 mg | ORAL_TABLET | Freq: Four times a day (QID) | ORAL | 1 refills | Status: DC | PRN

## 2019-03-09 NOTE — Addendum Note (Signed)
Addended by: Nicholas Lose on: 03/09/2019 11:28 AM   Modules accepted: Orders

## 2019-03-09 NOTE — Progress Notes (Signed)
Patient Care Team: [name], MD as PCP - General (Family Medicine) Rockwell Germany, RN as Oncology Nurse Navigator Tressie Ellis, Paulette Blanch, RN as Oncology Nurse Navigator Erroll Luna, MD as Consulting Physician (General Surgery) Nicholas Lose, MD as Consulting Physician (Hematology and Oncology) Kyung Rudd, MD as Consulting Physician (Radiation Oncology)  DIAGNOSIS:    ICD-10-CM   1. Malignant neoplasm of upper-inner quadrant of right breast in female, estrogen receptor negative ([place])  C50.211    Z17.1     SUMMARY OF ONCOLOGIC HISTORY: Oncology History  Malignant neoplasm of upper-inner quadrant of right breast in female, estrogen receptor negative ([place])  01/02/2019 Initial Diagnosis   Routine screening mammogram detected a 1.2cm mass in the right breast at the 12:30 location 7 cm from the nipple, no axillary adenopathy. Biopsy showed IDC, grade 2-3, HER-2 - (1+), ER -, PR -, Ki67 80%.    01/03/2019 Cancer Staging   Staging form: Breast, AJCC 8th Edition - Clinical stage from 01/03/2019: Stage IB (cT1c, cN0, cM0, G3, ER-, PR-, HER2-) - Signed by Nicholas Lose, MD on 01/03/2019   03/01/2019 Surgery   Lumpectomy ([facility]): IDC, grade 3, 1.4cm, clear margins, 3 lymph nodes negative for carcinoma.    03/09/2019 Cancer Staging   Staging form: Breast, AJCC 8th Edition - Pathologic stage from 03/09/2019: Stage IB (pT1c, pN0, cM0, G3, ER-, PR-, HER2-) - Signed by Nicholas Lose, MD on 03/09/2019     CHIEF COMPLIANT: Follow-up s/p lumpectomy to review pathology  INTERVAL HISTORY: Kathleen Potter is a 63 y.o. with above-mentioned history of right breast cancer. She underwent a lumpectomy on 03/01/19 with Dr. Brantley Stage for which pathology showed invasive ductal carcinoma, grade 3, 1.4cm, clear margins, 3 lymph nodes negative for carcinoma. She presents to the clinic today to discuss the pathology report and further treatment.   REVIEW OF SYSTEMS:   Constitutional: Denies fevers, chills or  abnormal weight loss Eyes: Denies blurriness of vision Ears, nose, mouth, throat, and face: Denies mucositis or sore throat Respiratory: Denies cough, dyspnea or wheezes Cardiovascular: Denies palpitation, chest discomfort Gastrointestinal: Denies nausea, heartburn or change in bowel habits Skin: Denies abnormal skin rashes Lymphatics: Denies new lymphadenopathy or easy bruising Neurological: Denies numbness, tingling or new weaknesses Behavioral/Psych: Mood is stable, no new changes  Extremities: No lower extremity edema Breast: denies any pain or lumps or nodules in either breasts All other systems were reviewed with the patient and are negative.  I have reviewed the past medical history, past surgical history, social history and family history with the patient and they are unchanged from previous note.  ALLERGIES:  is allergic to latex.  MEDICATIONS:  Current Outpatient Medications  Medication Sig Dispense Refill  . ibuprofen (ADVIL) 800 MG tablet Take 1 tablet (800 mg total) by mouth every 8 (eight) hours as needed. 30 tablet 0   No current facility-administered medications for this visit.     PHYSICAL EXAMINATION: ECOG PERFORMANCE STATUS: 1 - Symptomatic but completely ambulatory  Vitals:   03/09/19 0956  BP: (!) 188/86  Pulse: 77  Resp: 17  Temp: 98 F (36.7 C)  SpO2: 100%   Filed Weights   03/09/19 0956  Weight: 265 lb 1.6 oz (120.2 kg)    GENERAL: alert, no distress and comfortable SKIN: skin color, texture, turgor are normal, no rashes or significant lesions EYES: normal, Conjunctiva are pink and non-injected, sclera clear OROPHARYNX: no exudate, no erythema and lips, buccal mucosa, and tongue normal  NECK: supple, thyroid normal size,  non-tender, without nodularity LYMPH: no palpable lymphadenopathy in the cervical, axillary or inguinal LUNGS: clear to auscultation and percussion with normal breathing effort HEART: regular rate & rhythm and no murmurs and  no lower extremity edema ABDOMEN: abdomen soft, non-tender and normal bowel sounds MUSCULOSKELETAL: no cyanosis of digits and no clubbing  NEURO: alert & oriented x 3 with fluent speech, no focal motor/sensory deficits EXTREMITIES: No lower extremity edema  LABORATORY DATA:  I have reviewed the data as listed CMP Latest Ref Rng & Units 02/23/2019 01/03/2019  Glucose 70 - 99 mg/dL 101(H) 93  BUN 8 - 23 mg/dL 14 12  Creatinine 0.44 - 1.00 mg/dL 0.99 0.95  Sodium 135 - 145 mmol/L 141 140  Potassium 3.5 - 5.1 mmol/L 4.6 3.7  Chloride 98 - 111 mmol/L 110 108  CO2 22 - 32 mmol/L 18(L) 23  Calcium 8.9 - 10.3 mg/dL 8.7(L) 8.8(L)  Total Protein 6.5 - 8.1 g/dL 6.8 7.2  Total Bilirubin 0.3 - 1.2 mg/dL 0.9 0.4  Alkaline Phos 38 - 126 U/L 66 73  AST 15 - 41 U/L 23 16  ALT 0 - 44 U/L 17 15    Lab Results  Component Value Date   WBC 5.0 02/23/2019   HGB 12.3 02/23/2019   HCT 41.3 02/23/2019   MCV 76.9 (L) 02/23/2019   PLT 195 02/23/2019   NEUTROABS 2.4 02/23/2019    ASSESSMENT & PLAN:  Malignant neoplasm of upper-inner quadrant of right breast in female, estrogen receptor negative ([place]) 01/02/2019:Routine screening mammogram detected a 1.2cm mass in the right breast at the 12:30 location 7 cm from the nipple, no axillary adenopathy. Biopsy showed IDC, grade 2-3, HER-2 - (1+), ER -, PR -, Ki67 80%.   03/01/2019: Right lumpectomy: Grade 3 IDC, 1.4 cm, margins clear, 0/3 lymph nodes negative, triple negative stage IIb  Treatment plan: 1. Adjuvant chemotherapy with dose dense Adriamycin and Cytoxan x4 followed by Taxol weekly x12 2.  Followed by radiation therapy  Patient is complaining of an order underneath the right axilla.  Echocardiogram August 2020: EF 60 to 65% Plan to start chemotherapy 03/27/2019. Chemo education is being planned. Return to clinic at the start of chemotherapy.    No orders of the defined types were placed in this encounter.  The patient has a good  understanding of the overall plan. she agrees with it. she will call with any problems that may develop before the next visit here.  Nicholas Lose, MD 03/09/2019  Julious Oka Dorshimer am acting as scribe for Dr. Nicholas Lose.  I have reviewed the above documentation for accuracy and completeness, and I agree with the above.

## 2019-03-09 NOTE — Progress Notes (Signed)
START ON PATHWAY REGIMEN - Breast   Dose-Dense AC q14 days:   A cycle is every 14 days:     Doxorubicin      Cyclophosphamide      Pegfilgrastim-xxxx   **Always confirm dose/schedule in your pharmacy ordering system**  Paclitaxel 80 mg/m2 Weekly:   Administer weekly:     Paclitaxel   **Always confirm dose/schedule in your pharmacy ordering system**  Patient Characteristics: Postoperative without Neoadjuvant Therapy (Pathologic Staging), Invasive Disease, Adjuvant Therapy, HER2 Negative/Unknown/Equivocal, ER Negative/Unknown, Node Negative, pT1a-c, N15m or pT1c or Higher, pN0 Therapeutic Status: Postoperative without Neoadjuvant Therapy (Pathologic Staging) AJCC Grade: G3 AJCC N Category: pN0 AJCC M Category: cM0 ER Status: Negative (-) AJCC 8 Stage Grouping: IB HER2 Status: Negative (-) Oncotype Dx Recurrence Score: Not Appropriate AJCC T Category: pT1c PR Status: Negative (-) Intent of Therapy: Curative Intent, Discussed with Patient

## 2019-03-09 NOTE — Assessment & Plan Note (Signed)
01/02/2019:Routine screening mammogram detected a 1.2cm mass in the right breast at the 12:30 location 7 cm from the nipple, no axillary adenopathy. Biopsy showed IDC, grade 2-3, HER-2 - (1+), ER -, PR -, Ki67 80%.   03/01/2019: Right lumpectomy: Grade 3 IDC, 1.4 cm, margins clear, 0/3 lymph nodes negative, triple negative stage IIb  Treatment plan: 1. Adjuvant chemotherapy with dose dense Adriamycin and Cytoxan x4 followed by Taxol weekly x12 2.  Followed by radiation therapy  Echocardiogram August 2020: EF 60 to 65% Plan to start chemotherapy 03/27/2019. Chemo education is being planned. Return to clinic at the start of chemotherapy.

## 2019-03-12 ENCOUNTER — Inpatient Hospital Stay: Payer: BC Managed Care – PPO

## 2019-03-14 ENCOUNTER — Telehealth: Payer: Self-pay | Admitting: Hematology and Oncology

## 2019-03-14 NOTE — Telephone Encounter (Signed)
I talk with patient regarding schedule  

## 2019-03-19 ENCOUNTER — Other Ambulatory Visit: Payer: Self-pay

## 2019-03-19 ENCOUNTER — Ambulatory Visit: Payer: BC Managed Care – PPO | Attending: Surgery | Admitting: Physical Therapy

## 2019-03-19 ENCOUNTER — Encounter: Payer: Self-pay | Admitting: Physical Therapy

## 2019-03-19 DIAGNOSIS — C50211 Malignant neoplasm of upper-inner quadrant of right female breast: Secondary | ICD-10-CM | POA: Insufficient documentation

## 2019-03-19 DIAGNOSIS — R293 Abnormal posture: Secondary | ICD-10-CM | POA: Insufficient documentation

## 2019-03-19 DIAGNOSIS — Z171 Estrogen receptor negative status [ER-]: Secondary | ICD-10-CM | POA: Insufficient documentation

## 2019-03-19 DIAGNOSIS — Z483 Aftercare following surgery for neoplasm: Secondary | ICD-10-CM | POA: Diagnosis present

## 2019-03-19 NOTE — Therapy (Signed)
[place], [place], [address] Phone: [phone]   Fax:  [phone]  Physical Therapy Treatment  Patient Details  Name: Kathleen Potter MRN: [id number] Date of Birth: Sep 23, 1955 Referring Provider (PT): Dr. Erroll Luna   Encounter Date: 03/19/2019  PT End of Session - 03/19/19 1608    Visit Number  2    Number of Visits  2    PT Start Time  1503    PT Stop Time  1558    PT Time Calculation (min)  55 min    Activity Tolerance  Patient tolerated treatment well    Behavior During Therapy  [facility] for tasks assessed/performed       Past Medical History:  Diagnosis Date  . Family history of breast cancer   . Family history of prostate cancer   . Hypertension    Pt states her BP has been running high lately.    Past Surgical History:  Procedure Laterality Date  . BREAST BIOPSY Right   . BREAST LUMPECTOMY WITH RADIOACTIVE SEED AND SENTINEL LYMPH NODE BIOPSY Right 03/01/2019   Procedure: RIGHT BREAST LUMPECTOMY WITH RADIOACTIVE SEED;  Surgeon: Erroll Luna, MD;  Location: [place];  Service: General;  Laterality: Right;  . PARTIAL HYSTERECTOMY    . PORTACATH PLACEMENT N/A 03/01/2019   Procedure: INSERTION PORT-A-CATH WITH ULTRASOUND;  Surgeon: Erroll Luna, MD;  Location: [place];  Service: General;  Laterality: N/A;  . SENTINEL NODE BIOPSY Right 03/01/2019   Procedure: Right Sentinel Lymph Node Biopsy;  Surgeon: Erroll Luna, MD;  Location: [place];  Service: General;  Laterality: Right;    There were no vitals filed for this visit.  Subjective Assessment - 03/19/19 1557    Subjective  Patient reports she underwent a right lumpectomy and sentinel node biopsy on 03/01/2019 with 3 negative axillary nodes removed in a sentinel node biopsy. Cancer is triple negative and she plans to begin chemotherapy on 03/27/2019 followed by radiation. She reports being very anxious about chemotherapy so we  discussed some ways to reduce her anxiety including regular exercise.    Pertinent History  Patient was diagnosed on 12/21/2018 with right grade II-III invasive ductal carcinoma breast cancer. It is triple negative with a Ki67 of 80%. Patient reports she underwent a right lumpectomy and sentinel node biopsy on 03/01/2019 with 3 negative axillary nodes removed in a sentinel node biopsy.    Patient Stated Goals  See if my arm is doing ok    Currently in Pain?  No/denies         [facility] PT Assessment - 03/19/19 0001      Assessment   Medical Diagnosis  s/p right lumpectomy and SLNB    Referring Provider (PT)  Dr. Marcello Moores [facility]    Onset Date/Surgical Date  03/01/19    Hand Dominance  Right    Prior Therapy  baselines      Precautions   Precautions  Other (comment)    Precaution Comments  recent surgery; right arm lymphedema risk      Restrictions   Weight Bearing Restrictions  No      Balance Screen   Has the patient fallen in the past 6 months  No    Has the patient had a decrease in activity level because of a fear of falling?   No    Is the patient reluctant to leave their home because of a fear of falling?   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      Posture/Postural Control   Posture/Postural Control  Postural limitations    Postural Limitations  Rounded Shoulders;Forward head      ROM / Strength   AROM / PROM / Strength  AROM      AROM   AROM Assessment Site   Shoulder    Right/Left Shoulder  Right    Right Shoulder Extension  40 Degrees    Right Shoulder Flexion  147 Degrees    Right Shoulder ABduction  138 Degrees    Right Shoulder Internal Rotation  63 Degrees    Right Shoulder External Rotation  80 Degrees      Strength   Overall Strength  Within functional limits for tasks performed      Palpation   Palpation comment  Mild tenderness present in right breast with gentle palpation to assess scar tissue        LYMPHEDEMA/ONCOLOGY QUESTIONNAIRE - 03/19/19 1605      Type   Cancer Type  Right breast cancer      Surgeries   Lumpectomy Date  03/01/19    Sentinel Lymph Node Biopsy Date  03/01/19    Number Lymph Nodes Removed  3      Treatment   Active Chemotherapy Treatment  Yes    Date  --   Begins 03/27/2019   Past Chemotherapy Treatment  No    Active Radiation Treatment  No    Past Radiation Treatment  No    Current Hormone Treatment  No    Past Hormone Therapy  No      What other symptoms do you have   Are you Having Heaviness or Tightness  No    Are you having Pain  No    Are you having pitting edema  No    Is it Hard or Difficult finding clothes that fit  No    Do you have infections  No    Is there Decreased scar mobility  No    Stemmer Sign  No      Lymphedema Assessments   Lymphedema Assessments  Upper extremities      Right Upper Extremity Lymphedema   10 cm Proximal to Olecranon Process  35.3 cm    Olecranon Process  30.5 cm    10 cm Proximal to Ulnar Styloid Process  26.3 cm    Just Proximal to Ulnar Styloid Process  18.3 cm    Across Hand at [employer]  20.8 cm    At [place] of 2nd Digit  7.5 cm      Left Upper Extremity Lymphedema   10 cm Proximal to Olecranon Process  36.2 cm    Olecranon Process  30.3 cm    10 cm Proximal to Ulnar Styloid Process  25.7 cm    Just Proximal to Ulnar Styloid Process  17.8 cm    Across Hand at [employer]  20.3 cm    At [place] of 2nd Digit  7.1 cm        Katina Dung - 03/19/19 0001    Open a tight or new jar  Moderate difficulty    Do heavy household chores (wash walls, wash floors)  Moderate difficulty    Carry a shopping bag or briefcase  Mild difficulty    Wash your back  Severe difficulty    Use a knife to cut food  Moderate difficulty    Recreational activities in which you take some force or impact through your arm, shoulder, or hand (golf, hammering, tennis)  Moderate difficulty    During the past week, to what extent has your arm, shoulder or hand problem interfered with your normal social activities with family, friends, neighbors, or groups?  Modererately    During the past week, to what extent has your arm, shoulder or hand problem limited your work or other regular daily activities  Modererately    Arm, shoulder, or hand pain.  Moderate    Tingling (pins and needles) in your arm, shoulder, or hand  Moderate    Difficulty Sleeping  Mild difficulty    DASH Score  47.73 %                          PT Long Term Goals - 03/19/19 1614      PT LONG TERM GOAL #1   Title  Patient will demonstrate she has regained full shoulder ROM and function post operatively compared to baselines.    Baseline  ROM goal met but function not yet met as she has not attempted many activities.    Time  8    Period  Weeks    Status  Partially Met            Plan - 03/19/19 1608    Clinical Impression Statement  Patient is doing very well s/p right lumpectomy (0/3 ndes positive) and sentinel node biopsy. She begins chemotherapy on 03/27/2019 and reports being very anxious about that. Her shoulder ROM is back to baseline. Function is limited but mostly because she has not tried to resume normal activity.Her incisions appear to be healing well. We discussed various ways to improve tolerance to chemotherapy including staying hydrated and regularly walking on a daily basis. She plans to attend the [facility] on 04/09/2019 but has  no other PT needs at this time.    PT Treatment/Interventions  Patient/family education;Therapeutic exercise;ADLs/Self Care Home Management    PT Next Visit Plan  D/C    PT Home Exercise Plan  Post op shoulder ROM HEP    Consulted and Agree with Plan of Care  Patient       Patient will benefit from skilled therapeutic intervention in order to improve the following deficits and impairments:  Postural dysfunction, Decreased range of motion, Decreased knowledge of precautions, Impaired UE functional use, Pain  Visit Diagnosis: Malignant neoplasm of upper-inner quadrant of right breast in female, estrogen receptor negative ([facility])  Abnormal posture  Aftercare following surgery for neoplasm     Problem List Patient Active Problem List   Diagnosis Date Noted  . Family history of breast cancer   . Family history of prostate cancer   . Malignant neoplasm of upper-inner quadrant of right breast in female, estrogen receptor negative ([place]) 01/02/2019   PHYSICAL THERAPY  DISCHARGE SUMMARY  Visits from Start of Care: 2  Current functional level related to goals / functional outcomes: See above for objective measurements taken today.   Remaining deficits: Some upper extremity functional deficits as noted in DASH.   Education / Equipment: HEP and lymphedema risk reduction. Plan: Patient agrees to discharge.  Patient goals were partially met. Patient is being discharged due to being pleased with the current functional level.  ?????         Annia Friendly, [place] 03/19/19 4:16 PM  [place], [place], [address] Phone: [phone]   Fax:  [phone]  Name: Kathleen Potter MRN: [id number] Date of Birth: 1956-04-05

## 2019-03-20 ENCOUNTER — Other Ambulatory Visit: Payer: Self-pay

## 2019-03-20 ENCOUNTER — Inpatient Hospital Stay: Payer: BC Managed Care – PPO

## 2019-03-26 ENCOUNTER — Encounter: Payer: Self-pay | Admitting: Hematology and Oncology

## 2019-03-26 NOTE — Progress Notes (Signed)
Called ptto introduce myself as her Arboriculturist andtodiscuss copay assistance.  Pt gave me consent to apply in her behalf so I completed the online application with the [place] for [employer].  Pt's app is pending so I will notify herof the outcome once I receive it.  Pt is overqualified for the [employer].  I will give her my cardon 03/27/19 for any questions or concernsshe may have in the future.

## 2019-03-26 NOTE — Progress Notes (Signed)
Patient Care Team: [name], MD as PCP - General (Family Medicine) Rockwell Germany, RN as Oncology Nurse Navigator Tressie Ellis, Paulette Blanch, RN as Oncology Nurse Navigator Erroll Luna, MD as Consulting Physician (General Surgery) Nicholas Lose, MD as Consulting Physician (Hematology and Oncology) Kyung Rudd, MD as Consulting Physician (Radiation Oncology)  DIAGNOSIS:    ICD-10-CM   1. Malignant neoplasm of upper-inner quadrant of right breast in female, estrogen receptor negative ([place])  C50.211    Z17.1     SUMMARY OF ONCOLOGIC HISTORY: Oncology History  Malignant neoplasm of upper-inner quadrant of right breast in female, estrogen receptor negative ([place])  01/02/2019 Initial Diagnosis   Routine screening mammogram detected a 1.2cm mass in the right breast at the 12:30 location 7 cm from the nipple, no axillary adenopathy. Biopsy showed IDC, grade 2-3, HER-2 - (1+), ER -, PR -, Ki67 80%.    01/03/2019 Cancer Staging   Staging form: Breast, AJCC 8th Edition - Clinical stage from 01/03/2019: Stage IB (cT1c, cN0, cM0, G3, ER-, PR-, HER2-) - Signed by Nicholas Lose, MD on 01/03/2019   03/01/2019 Surgery   Lumpectomy ([place]): IDC, grade 3, 1.4cm, clear margins, 3 lymph nodes negative for carcinoma.    03/09/2019 Cancer Staging   Staging form: Breast, AJCC 8th Edition - Pathologic stage from 03/09/2019: Stage IB (pT1c, pN0, cM0, G3, ER-, PR-, HER2-) - Signed by Nicholas Lose, MD on 03/09/2019   03/27/2019 -  Chemotherapy   The patient had DOXOrubicin (ADRIAMYCIN) chemo injection 142 mg, 60 mg/m2 = 142 mg, Intravenous,  Once, 0 of 4 cycles palonosetron (ALOXI) injection 0.25 mg, 0.25 mg, Intravenous,  Once, 0 of 4 cycles pegfilgrastim-jmdb (FULPHILA) injection 6 mg, 6 mg, Subcutaneous,  Once, 0 of 4 cycles cyclophosphamide (CYTOXAN) 1,420 mg in sodium chloride 0.9 % 250 mL chemo infusion, 600 mg/m2 = 1,420 mg, Intravenous,  Once, 0 of 4 cycles PACLitaxel (TAXOL) 192 mg in sodium chloride  0.9 % 250 mL chemo infusion (</= 40m/m2), 80 mg/m2 = 192 mg, Intravenous,  Once, 0 of 12 cycles fosaprepitant (EMEND) 150 mg, dexamethasone (DECADRON) 12 mg in sodium chloride 0.9 % 145 mL IVPB, , Intravenous,  Once, 0 of 4 cycles  for chemotherapy treatment.      CHIEF COMPLIANT: Cycle 1 Adriamycin and Cytoxan  INTERVAL HISTORY: Kathleen Loflinis a 63y.o. 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 She is currently on adjuvant chemotherapy with dose dense Adriamycin and Cytoxan. She presents to the clinic today for cycle 1.   REVIEW OF SYSTEMS:   Constitutional: Denies fevers, chills or abnormal weight loss Eyes: Denies blurriness of vision Ears, nose, mouth, throat, and face: Denies mucositis or sore throat Respiratory: Denies cough, dyspnea or wheezes Cardiovascular: Denies palpitation, chest discomfort Gastrointestinal: Denies nausea, heartburn or change in bowel habits Skin: Denies abnormal skin rashes Lymphatics: Denies new lymphadenopathy or easy bruising Neurological: Denies numbness, tingling or new weaknesses Behavioral/Psych: Mood is stable, no new changes  Extremities: No lower extremity edema Breast: denies any pain or lumps or nodules in either breasts All other systems were reviewed with the patient and are negative.  I have reviewed the past medical history, past surgical history, social history and family history with the patient and they are unchanged from previous note.  ALLERGIES:  is allergic to latex.  MEDICATIONS:  Current Outpatient Medications  Medication Sig Dispense Refill   dexamethasone (DECADRON) 4 MG tablet Take 1 tablet day after chemo and  1 tablet 2 days after chemo with food 8 tablet 0   ibuprofen (ADVIL) 800 MG tablet Take 1 tablet (800 mg total) by mouth every 8 (eight) hours as needed. 30 tablet 0   lidocaine-prilocaine (EMLA) cream Apply to affected area once 30 g 3   LORazepam  (ATIVAN) 0.5 MG tablet Take 1 tablet (0.5 mg total) by mouth at bedtime as needed (Nausea or vomiting). 30 tablet 0   ondansetron (ZOFRAN) 8 MG tablet Take 1 tablet (8 mg total) by mouth 2 (two) times daily as needed. Start on the third day after chemotherapy. 30 tablet 1   prochlorperazine (COMPAZINE) 10 MG tablet Take 1 tablet (10 mg total) by mouth every 6 (six) hours as needed (Nausea or vomiting). 30 tablet 1   No current facility-administered medications for this visit.     PHYSICAL EXAMINATION: ECOG PERFORMANCE STATUS: 1 - Symptomatic but completely ambulatory  Vitals:   03/27/19 1108  BP: (!) 142/71  Pulse: 71  Resp: 17  Temp: 98.3 F (36.8 C)  SpO2: 99%   Filed Weights   03/27/19 1108  Weight: 266 lb 14.4 oz (121.1 kg)    GENERAL: alert, no distress and comfortable SKIN: skin color, texture, turgor are normal, no rashes or significant lesions EYES: normal, Conjunctiva are pink and non-injected, sclera clear OROPHARYNX: no exudate, no erythema and lips, buccal mucosa, and tongue normal  NECK: supple, thyroid normal size, non-tender, without nodularity LYMPH: no palpable lymphadenopathy in the cervical, axillary or inguinal LUNGS: clear to auscultation and percussion with normal breathing effort HEART: regular rate & rhythm and no murmurs and no lower extremity edema ABDOMEN: abdomen soft, non-tender and normal bowel sounds MUSCULOSKELETAL: no cyanosis of digits and no clubbing  NEURO: alert & oriented x 3 with fluent speech, no focal motor/sensory deficits EXTREMITIES: No lower extremity edema  LABORATORY DATA:  I have reviewed the data as listed CMP Latest Ref Rng & Units 02/23/2019 01/03/2019  Glucose 70 - 99 mg/dL 101(H) 93  BUN 8 - 23 mg/dL 14 12  Creatinine 0.44 - 1.00 mg/dL 0.99 0.95  Sodium 135 - 145 mmol/L 141 140  Potassium 3.5 - 5.1 mmol/L 4.6 3.7  Chloride 98 - 111 mmol/L 110 108  CO2 22 - 32 mmol/L 18(L) 23  Calcium 8.9 - 10.3 mg/dL 8.7(L) 8.8(L)    Total Protein 6.5 - 8.1 g/dL 6.8 7.2  Total Bilirubin 0.3 - 1.2 mg/dL 0.9 0.4  Alkaline Phos 38 - 126 U/L 66 73  AST 15 - 41 U/L 23 16  ALT 0 - 44 U/L 17 15    Lab Results  Component Value Date   WBC 5.7 03/27/2019   HGB 11.8 (L) 03/27/2019   HCT 37.5 03/27/2019   MCV 72.4 (L) 03/27/2019   PLT 204 03/27/2019   NEUTROABS 3.1 03/27/2019    ASSESSMENT & PLAN:  Malignant neoplasm of upper-inner quadrant of right breast in female, estrogen receptor negative ([place]) 01/02/2019:Routine screening mammogram detected a 1.2cm mass in the right breast at the 12:30 location 7 cm from the nipple, no axillary adenopathy. Biopsy showed IDC, grade 2-3, HER-2 - (1+), ER -, PR -, Ki67 80%.  03/01/2019: Right lumpectomy: Grade 3 IDC, 1.4 cm, margins clear, 0/3 lymph nodes negative, triple negative stage IIb  Treatment plan: 1. Adjuvant chemotherapy with dose dense Adriamycin and Cytoxan x4 followed by Taxol weekly x12 2.Followed by radiation therapy ----------------------------------------------------------------------------------------------------------------------------------------------------- Current treatment: Cycle 1 day 1 Adriamycin and Cytoxan. I am starting her on a  slightly lower dosage because I am worried about toxicities. Echocardiogram August 2020: EF 60 to 65% Labs reviewed, chemo consent obtained, chemo education completed Antiemetics were reviewed Return to clinic in 1 week for toxicity check    No orders of the defined types were placed in this encounter.  The patient has a good understanding of the overall plan. she agrees with it. she will call with any problems that may develop before the next visit here.  Nicholas Lose, MD 03/27/2019  Julious Oka Dorshimer am acting as scribe for Dr. Nicholas Lose.  I have reviewed the above documentation for accuracy and completeness, and I agree with the above.

## 2019-03-27 ENCOUNTER — Inpatient Hospital Stay (HOSPITAL_BASED_OUTPATIENT_CLINIC_OR_DEPARTMENT_OTHER): Payer: BC Managed Care – PPO | Admitting: Hematology and Oncology

## 2019-03-27 ENCOUNTER — Inpatient Hospital Stay: Payer: BC Managed Care – PPO

## 2019-03-27 ENCOUNTER — Inpatient Hospital Stay: Payer: BC Managed Care – PPO | Attending: Hematology and Oncology

## 2019-03-27 ENCOUNTER — Encounter: Payer: Self-pay | Admitting: Hematology and Oncology

## 2019-03-27 ENCOUNTER — Other Ambulatory Visit: Payer: Self-pay

## 2019-03-27 DIAGNOSIS — Z171 Estrogen receptor negative status [ER-]: Secondary | ICD-10-CM | POA: Insufficient documentation

## 2019-03-27 DIAGNOSIS — Z5111 Encounter for antineoplastic chemotherapy: Secondary | ICD-10-CM | POA: Insufficient documentation

## 2019-03-27 DIAGNOSIS — C50211 Malignant neoplasm of upper-inner quadrant of right female breast: Secondary | ICD-10-CM | POA: Diagnosis present

## 2019-03-27 DIAGNOSIS — Z95828 Presence of other vascular implants and grafts: Secondary | ICD-10-CM

## 2019-03-27 DIAGNOSIS — Z5189 Encounter for other specified aftercare: Secondary | ICD-10-CM | POA: Insufficient documentation

## 2019-03-27 LAB — CBC WITH DIFFERENTIAL (CANCER CENTER ONLY)
Abs Immature Granulocytes: 0.01 10*3/uL (ref 0.00–0.07)
Basophils Absolute: 0 10*3/uL (ref 0.0–0.1)
Basophils Relative: 1 %
Eosinophils Absolute: 0 10*3/uL (ref 0.0–0.5)
Eosinophils Relative: 1 %
HCT: 37.5 % (ref 36.0–46.0)
Hemoglobin: 11.8 g/dL — ABNORMAL LOW (ref 12.0–15.0)
Immature Granulocytes: 0 %
Lymphocytes Relative: 37 %
Lymphs Abs: 2.1 10*3/uL (ref 0.7–4.0)
MCH: 22.8 pg — ABNORMAL LOW (ref 26.0–34.0)
MCHC: 31.5 g/dL (ref 30.0–36.0)
MCV: 72.4 fL — ABNORMAL LOW (ref 80.0–100.0)
Monocytes Absolute: 0.4 10*3/uL (ref 0.1–1.0)
Monocytes Relative: 8 %
Neutro Abs: 3.1 10*3/uL (ref 1.7–7.7)
Neutrophils Relative %: 53 %
Platelet Count: 204 10*3/uL (ref 150–400)
RBC: 5.18 MIL/uL — ABNORMAL HIGH (ref 3.87–5.11)
RDW: 16.5 % — ABNORMAL HIGH (ref 11.5–15.5)
WBC Count: 5.7 10*3/uL (ref 4.0–10.5)
nRBC: 0 % (ref 0.0–0.2)

## 2019-03-27 LAB — CMP (CANCER CENTER ONLY)
ALT: 16 U/L (ref 0–44)
AST: 15 U/L (ref 15–41)
Albumin: 3.8 g/dL (ref 3.5–5.0)
Alkaline Phosphatase: 85 U/L (ref 38–126)
Anion gap: 9 (ref 5–15)
BUN: 9 mg/dL (ref 8–23)
CO2: 23 mmol/L (ref 22–32)
Calcium: 8.7 mg/dL — ABNORMAL LOW (ref 8.9–10.3)
Chloride: 107 mmol/L (ref 98–111)
Creatinine: 0.9 mg/dL (ref 0.44–1.00)
GFR, Est AFR Am: >60 mL/min (ref >60)
GFR, Estimated: >60 mL/min (ref >60)
Glucose, Bld: 103 mg/dL — ABNORMAL HIGH (ref 70–99)
Potassium: 3.8 mmol/L (ref 3.5–5.1)
Sodium: 139 mmol/L (ref 135–145)
Total Bilirubin: 0.4 mg/dL (ref 0.3–1.2)
Total Protein: 7.4 g/dL (ref 6.5–8.1)

## 2019-03-27 MED ORDER — PALONOSETRON HCL INJECTION 0.25 MG/5ML
INTRAVENOUS | Status: AC
  Filled 2019-03-27: qty 5

## 2019-03-27 MED ORDER — SODIUM CHLORIDE 0.9 % IV SOLN
Freq: Once | INTRAVENOUS | Status: AC
  Administered 2019-03-27: 12:00:00 via INTRAVENOUS
  Filled 2019-03-27: qty 5

## 2019-03-27 MED ORDER — HEPARIN SOD (PORK) LOCK FLUSH 100 UNIT/ML IV SOLN
500.0000 [IU] | Freq: Once | INTRAVENOUS | Status: AC | PRN
  Administered 2019-03-27: 500 [IU]
  Filled 2019-03-27: qty 5

## 2019-03-27 MED ORDER — SODIUM CHLORIDE 0.9 % IV SOLN
500.0000 mg/m2 | Freq: Once | INTRAVENOUS | Status: AC
  Administered 2019-03-27: 1180 mg via INTRAVENOUS
  Filled 2019-03-27: qty 59

## 2019-03-27 MED ORDER — SODIUM CHLORIDE 0.9% FLUSH
10.0000 mL | INTRAVENOUS | Status: DC | PRN
  Administered 2019-03-27: 11:00:00 10 mL via INTRAVENOUS
  Filled 2019-03-27: qty 10

## 2019-03-27 MED ORDER — PALONOSETRON HCL INJECTION 0.25 MG/5ML
0.2500 mg | Freq: Once | INTRAVENOUS | Status: AC
  Administered 2019-03-27: 0.25 mg via INTRAVENOUS

## 2019-03-27 MED ORDER — DOXORUBICIN HCL CHEMO IV INJECTION 2 MG/ML
50.0000 mg/m2 | Freq: Once | INTRAVENOUS | Status: AC
  Administered 2019-03-27: 118 mg via INTRAVENOUS
  Filled 2019-03-27: qty 59

## 2019-03-27 MED ORDER — SODIUM CHLORIDE 0.9% FLUSH
10.0000 mL | INTRAVENOUS | Status: DC | PRN
  Administered 2019-03-27: 10 mL
  Filled 2019-03-27: qty 10

## 2019-03-27 MED ORDER — SODIUM CHLORIDE 0.9 % IV SOLN
Freq: Once | INTRAVENOUS | Status: AC
  Administered 2019-03-27: 12:00:00 via INTRAVENOUS
  Filled 2019-03-27: qty 250

## 2019-03-27 NOTE — Patient Instructions (Addendum)
[facility] Discharge Instructions for Patients Receiving Chemotherapy  Today you received the following chemotherapy agents: doxorubicin and cyclophosphamide.  To help prevent nausea and vomiting after your treatment, we encourage you to take your nausea medication as prescribed by your physician.    If you develop nausea and vomiting that is not controlled by your nausea medication, call the clinic.   BELOW ARE SYMPTOMS THAT SHOULD BE REPORTED IMMEDIATELY:  *FEVER GREATER THAN 100.5 F  *CHILLS WITH OR WITHOUT FEVER  NAUSEA AND VOMITING THAT IS NOT CONTROLLED WITH YOUR NAUSEA MEDICATION  *UNUSUAL SHORTNESS OF BREATH  *UNUSUAL BRUISING OR BLEEDING  TENDERNESS IN MOUTH AND THROAT WITH OR WITHOUT PRESENCE OF ULCERS  *URINARY PROBLEMS  *BOWEL PROBLEMS  UNUSUAL RASH Items with * indicate a potential emergency and should be followed up as soon as possible.  Feel free to call the clinic should you have any questions or concerns. The clinic phone number is [phone].  Please show the [facility] at check-in to the Emergency Department and triage nurse.  Doxorubicin injection What is this medicine? DOXORUBICIN (dox oh ROO bi sin) is a chemotherapy drug. It is used to treat many kinds of cancer like leukemia, lymphoma, neuroblastoma, sarcoma, and Wilms' tumor. It is also used to treat bladder cancer, breast cancer, lung cancer, ovarian cancer, stomach cancer, and thyroid cancer. This medicine may be used for other purposes; ask your health care provider or pharmacist if you have questions. COMMON BRAND NAME(S): Adriamycin, Adriamycin PFS, Adriamycin RDF, Rubex What should I tell my health care provider before I take this medicine? They need to know if you have any of these conditions:  heart disease  history of low blood counts caused by a medicine  liver disease  recent or ongoing radiation therapy  an unusual or allergic reaction to doxorubicin,  other chemotherapy agents, other medicines, foods, dyes, or preservatives  pregnant or trying to get pregnant  breast-feeding How should I use this medicine? This drug is given as an infusion into a vein. It is administered in a hospital or clinic by a specially trained health care professional. If you have pain, swelling, burning or any unusual feeling around the site of your injection, tell your health care professional right away. Talk to your pediatrician regarding the use of this medicine in children. Special care may be needed. Overdosage: If you think you have taken too much of this medicine contact a poison control center or emergency room at once. NOTE: This medicine is only for you. Do not share this medicine with others. What if I miss a dose? It is important not to miss your dose. Call your doctor or health care professional if you are unable to keep an appointment. What may interact with this medicine? This medicine may interact with the following medications:  6-mercaptopurine  paclitaxel  phenytoin  St. John's Wort  trastuzumab  verapamil This list may not describe all possible interactions. Give your health care provider a list of all the medicines, herbs, non-prescription drugs, or dietary supplements you use. Also tell them if you smoke, drink alcohol, or use illegal drugs. Some items may interact with your medicine. What should I watch for while using this medicine? This drug may make you feel generally unwell. This is not uncommon, as chemotherapy can affect healthy cells as well as cancer cells. Report any side effects. Continue your course of treatment even though you feel ill unless your doctor tells you to stop. There  is a maximum amount of this medicine you should receive throughout your life. The amount depends on the medical condition being treated and your overall health. Your doctor will watch how much of this medicine you receive in your lifetime. Tell your  doctor if you have taken this medicine before. You may need blood work done while you are taking this medicine. Your urine may turn red for a few days after your dose. This is not blood. If your urine is dark or brown, call your doctor. In some cases, you may be given additional medicines to help with side effects. Follow all directions for their use. Call your doctor or health care professional for advice if you get a fever, chills or sore throat, or other symptoms of a cold or flu. Do not treat yourself. This drug decreases your body's ability to fight infections. Try to avoid being around people who are sick. This medicine may increase your risk to bruise or bleed. Call your doctor or health care professional if you notice any unusual bleeding. Talk to your doctor about your risk of cancer. You may be more at risk for certain types of cancers if you take this medicine. Do not become pregnant while taking this medicine or for 6 months after stopping it. Women should inform their doctor if they wish to become pregnant or think they might be pregnant. Men should not father a child while taking this medicine and for 6 months after stopping it. There is a potential for serious side effects to an unborn child. Talk to your health care professional or pharmacist for more information. Do not breast-feed an infant while taking this medicine. This medicine has caused ovarian failure in some women and reduced sperm counts in some men This medicine may interfere with the ability to have a child. Talk with your doctor or health care professional if you are concerned about your fertility. This medicine may cause a decrease in Co-Enzyme Q-10. You should make sure that you get enough Co-Enzyme Q-10 while you are taking this medicine. Discuss the foods you eat and the vitamins you take with your health care professional. What side effects may I notice from receiving this medicine? Side effects that you should report to  your doctor or health care professional as soon as possible:  allergic reactions like skin rash, itching or hives, swelling of the face, lips, or tongue  breathing problems  chest pain  fast or irregular heartbeat  low blood counts - this medicine may decrease the number of white blood cells, red blood cells and platelets. You may be at increased risk for infections and bleeding.  pain, redness, or irritation at site where injected  signs of infection - fever or chills, cough, sore throat, pain or difficulty passing urine  signs of decreased platelets or bleeding - bruising, pinpoint red spots on the skin, black, tarry stools, blood in the urine  swelling of the ankles, feet, hands  tiredness  weakness Side effects that usually do not require medical attention (report to your doctor or health care professional if they continue or are bothersome):  diarrhea  hair loss  mouth sores  nail discoloration or damage  nausea  red colored urine  vomiting This list may not describe all possible side effects. Call your doctor for medical advice about side effects. You may report side effects to FDA at 1-800-FDA-1088. Where should I keep my medicine? This drug is given in a hospital or clinic and will not  be stored at home. NOTE: This sheet is a summary. It may not cover all possible information. If you have questions about this medicine, talk to your doctor, pharmacist, or health care provider.  2020 Elsevier/Gold Standard (2016-12-22 11:01:26)  Cyclophosphamide injection What is this medicine? CYCLOPHOSPHAMIDE (sye kloe FOSS fa mide) is a chemotherapy drug. It slows the growth of cancer cells. This medicine is used to treat many types of cancer like lymphoma, myeloma, leukemia, breast cancer, and ovarian cancer, to name a few. This medicine may be used for other purposes; ask your health care provider or pharmacist if you have questions. COMMON BRAND NAME(S): Cytoxan,  Neosar What should I tell my health care provider before I take this medicine? They need to know if you have any of these conditions:  blood disorders  history of other chemotherapy  infection  kidney disease  liver disease  recent or ongoing radiation therapy  tumors in the bone marrow  an unusual or allergic reaction to cyclophosphamide, other chemotherapy, other medicines, foods, dyes, or preservatives  pregnant or trying to get pregnant  breast-feeding How should I use this medicine? This drug is usually given as an injection into a vein or muscle or by infusion into a vein. It is administered in a hospital or clinic by a specially trained health care professional. Talk to your pediatrician regarding the use of this medicine in children. Special care may be needed. Overdosage: If you think you have taken too much of this medicine contact a poison control center or emergency room at once. NOTE: This medicine is only for you. Do not share this medicine with others. What if I miss a dose? It is important not to miss your dose. Call your doctor or health care professional if you are unable to keep an appointment. What may interact with this medicine? This medicine may interact with the following medications:  amiodarone  amphotericin B  azathioprine  certain antiviral medicines for HIV or AIDS such as protease inhibitors (e.g., indinavir, ritonavir) and zidovudine  certain blood pressure medications such as benazepril, captopril, enalapril, fosinopril, lisinopril, moexipril, monopril, perindopril, quinapril, ramipril, trandolapril  certain cancer medications such as anthracyclines (e.g., daunorubicin, doxorubicin), busulfan, cytarabine, paclitaxel, pentostatin, tamoxifen, trastuzumab  certain diuretics such as chlorothiazide, chlorthalidone, hydrochlorothiazide, indapamide, metolazone  certain medicines that treat or prevent blood clots like warfarin  certain muscle  relaxants such as succinylcholine  cyclosporine  etanercept  indomethacin  medicines to increase blood counts like filgrastim, pegfilgrastim, sargramostim  medicines used as general anesthesia  metronidazole  natalizumab This list may not describe all possible interactions. Give your health care provider a list of all the medicines, herbs, non-prescription drugs, or dietary supplements you use. Also tell them if you smoke, drink alcohol, or use illegal drugs. Some items may interact with your medicine. What should I watch for while using this medicine? Visit your doctor for checks on your progress. This drug may make you feel generally unwell. This is not uncommon, as chemotherapy can affect healthy cells as well as cancer cells. Report any side effects. Continue your course of treatment even though you feel ill unless your doctor tells you to stop. Drink water or other fluids as directed. Urinate often, even at night. In some cases, you may be given additional medicines to help with side effects. Follow all directions for their use. Call your doctor or health care professional for advice if you get a fever, chills or sore throat, or other symptoms of a cold  or flu. Do not treat yourself. This drug decreases your body's ability to fight infections. Try to avoid being around people who are sick. This medicine may increase your risk to bruise or bleed. Call your doctor or health care professional if you notice any unusual bleeding. Be careful brushing and flossing your teeth or using a toothpick because you may get an infection or bleed more easily. If you have any dental work done, tell your dentist you are receiving this medicine. You may get drowsy or dizzy. Do not drive, use machinery, or do anything that needs mental alertness until you know how this medicine affects you. Do not become pregnant while taking this medicine or for 1 year after stopping it. Women should inform their doctor if  they wish to become pregnant or think they might be pregnant. Men should not father a child while taking this medicine and for 4 months after stopping it. There is a potential for serious side effects to an unborn child. Talk to your health care professional or pharmacist for more information. Do not breast-feed an infant while taking this medicine. This medicine may interfere with the ability to have a child. This medicine has caused ovarian failure in some women. This medicine has caused reduced sperm counts in some men. You should talk with your doctor or health care professional if you are concerned about your fertility. If you are going to have surgery, tell your doctor or health care professional that you have taken this medicine. What side effects may I notice from receiving this medicine? Side effects that you should report to your doctor or health care professional as soon as possible:  allergic reactions like skin rash, itching or hives, swelling of the face, lips, or tongue  low blood counts - this medicine may decrease the number of white blood cells, red blood cells and platelets. You may be at increased risk for infections and bleeding.  signs of infection - fever or chills, cough, sore throat, pain or difficulty passing urine  signs of decreased platelets or bleeding - bruising, pinpoint red spots on the skin, black, tarry stools, blood in the urine  signs of decreased red blood cells - unusually weak or tired, fainting spells, lightheadedness  breathing problems  dark urine  dizziness  palpitations  swelling of the ankles, feet, hands  trouble passing urine or change in the amount of urine  weight gain  yellowing of the eyes or skin Side effects that usually do not require medical attention (report to your doctor or health care professional if they continue or are bothersome):  changes in nail or skin color  hair loss  missed menstrual periods  mouth  sores  nausea, vomiting This list may not describe all possible side effects. Call your doctor for medical advice about side effects. You may report side effects to FDA at 1-800-FDA-1088. Where should I keep my medicine? This drug is given in a hospital or clinic and will not be stored at home. NOTE: This sheet is a summary. It may not cover all possible information. If you have questions about this medicine, talk to your doctor, pharmacist, or health care provider.  2020 Elsevier/Gold Standard (2012-03-24 16:22:58)

## 2019-03-27 NOTE — Assessment & Plan Note (Signed)
01/02/2019:Routine screening mammogram detected a 1.2cm mass in the right breast at the 12:30 location 7 cm from the nipple, no axillary adenopathy. Biopsy showed IDC, grade 2-3, HER-2 - (1+), ER -, PR -, Ki67 80%.  03/01/2019: Right lumpectomy: Grade 3 IDC, 1.4 cm, margins clear, 0/3 lymph nodes negative, triple negative stage IIb  Treatment plan: 1. Adjuvant chemotherapy with dose dense Adriamycin and Cytoxan x4 followed by Taxol weekly x12 2.Followed by radiation therapy ----------------------------------------------------------------------------------------------------------------------------------------------------- Current treatment: Cycle 1 day 1 Adriamycin and Cytoxan. Echocardiogram August 2020: EF 60 to 65% Labs reviewed, chemo consent obtained, chemo education completed Antiemetics were reviewed Return to clinic in 1 week for toxicity check

## 2019-03-27 NOTE — Progress Notes (Signed)
Pt was approved w/ [employer] for Fulphila from 03/26/19 to 03/24/20 for up to $10,000.  The program reduces pt's copay responsibility to $0.

## 2019-03-27 NOTE — Patient Instructions (Signed)

## 2019-03-28 ENCOUNTER — Telehealth: Payer: Self-pay | Admitting: *Deleted

## 2019-03-28 NOTE — Telephone Encounter (Signed)
Spoke with patient to follow up after 1st A/C.  She states she is doing ok with no complaints.  She states she is drinking plenty of water and taking her medications as prescribed.  Instructed her to call with any needs or concerns.

## 2019-03-28 NOTE — Telephone Encounter (Signed)
-----   Message from Teodoro Spray, RN sent at 03/27/2019  2:39 PM EST ----- Regarding: Dr. Lindi Adie first time chemo followup Dr. Lindi Adie first time chemo follow-up phone call. Patient tolerated chemo well. Thank you.

## 2019-03-28 NOTE — Telephone Encounter (Signed)
Nurse Navigator, Varney Biles has already talked with pt today therefore this nurse did not call her again.  Pt Kathleen Potter's note, pt doing OK & knows how to reach [place] if questions/concerns.

## 2019-03-29 ENCOUNTER — Inpatient Hospital Stay: Payer: BC Managed Care – PPO

## 2019-03-29 ENCOUNTER — Other Ambulatory Visit: Payer: Self-pay

## 2019-03-29 VITALS — BP 132/68 | HR 70 | Temp 98.7°F | Resp 18

## 2019-03-29 DIAGNOSIS — C50211 Malignant neoplasm of upper-inner quadrant of right female breast: Secondary | ICD-10-CM

## 2019-03-29 DIAGNOSIS — Z5111 Encounter for antineoplastic chemotherapy: Secondary | ICD-10-CM | POA: Diagnosis not present

## 2019-03-29 MED ORDER — PEGFILGRASTIM-JMDB 6 MG/0.6ML ~~LOC~~ SOSY
6.0000 mg | PREFILLED_SYRINGE | Freq: Once | SUBCUTANEOUS | Status: AC
  Administered 2019-03-29: 14:00:00 6 mg via SUBCUTANEOUS

## 2019-03-29 MED ORDER — PEGFILGRASTIM-JMDB 6 MG/0.6ML ~~LOC~~ SOSY
PREFILLED_SYRINGE | SUBCUTANEOUS | Status: AC
  Filled 2019-03-29: qty 0.6

## 2019-03-29 NOTE — Patient Instructions (Signed)

## 2019-04-02 NOTE — Progress Notes (Signed)
Patient Care Team: [name], MD as PCP - General (Family Medicine) Rockwell Germany, RN as Oncology Nurse Navigator Tressie Ellis, Paulette Blanch, RN as Oncology Nurse Navigator Erroll Luna, MD as Consulting Physician (General Surgery) Nicholas Lose, MD as Consulting Physician (Hematology and Oncology) Kyung Rudd, MD as Consulting Physician (Radiation Oncology)  DIAGNOSIS:    ICD-10-CM   1. Malignant neoplasm of upper-inner quadrant of right breast in female, estrogen receptor negative ([place])  C50.211    Z17.1     SUMMARY OF ONCOLOGIC HISTORY: Oncology History  Malignant neoplasm of upper-inner quadrant of right breast in female, estrogen receptor negative ([place])  01/02/2019 Initial Diagnosis   Routine screening mammogram detected a 1.2cm mass in the right breast at the 12:30 location 7 cm from the nipple, no axillary adenopathy. Biopsy showed IDC, grade 2-3, HER-2 - (1+), ER -, PR -, Ki67 80%.    01/03/2019 Cancer Staging   Staging form: Breast, AJCC 8th Edition - Clinical stage from 01/03/2019: Stage IB (cT1c, cN0, cM0, G3, ER-, PR-, HER2-) - Signed by Nicholas Lose, MD on 01/03/2019   03/01/2019 Surgery   Lumpectomy ([facility]): IDC, grade 3, 1.4cm, clear margins, 3 lymph nodes negative for carcinoma.    03/09/2019 Cancer Staging   Staging form: Breast, AJCC 8th Edition - Pathologic stage from 03/09/2019: Stage IB (pT1c, pN0, cM0, G3, ER-, PR-, HER2-) - Signed by Nicholas Lose, MD on 03/09/2019   03/27/2019 -  Chemotherapy   The patient had DOXOrubicin (ADRIAMYCIN) chemo injection 118 mg, 50 mg/m2 = 118 mg (83.3 % of original dose 60 mg/m2), Intravenous,  Once, 1 of 4 cycles Dose modification: 50 mg/m2 (original dose 60 mg/m2, Cycle 1, Reason: Provider Judgment) Administration: 118 mg (03/27/2019) palonosetron (ALOXI) injection 0.25 mg, 0.25 mg, Intravenous,  Once, 1 of 4 cycles Administration: 0.25 mg (03/27/2019) pegfilgrastim-jmdb (FULPHILA) injection 6 mg, 6 mg, Subcutaneous,  Once, 1  of 4 cycles Administration: 6 mg (03/29/2019) cyclophosphamide (CYTOXAN) 1,180 mg in sodium chloride 0.9 % 250 mL chemo infusion, 500 mg/m2 = 1,180 mg (83.3 % of original dose 600 mg/m2), Intravenous,  Once, 1 of 4 cycles Dose modification: 500 mg/m2 (original dose 600 mg/m2, Cycle 1, Reason: Provider Judgment) Administration: 1,180 mg (03/27/2019) PACLitaxel (TAXOL) 192 mg in sodium chloride 0.9 % 250 mL chemo infusion (</= 59m/m2), 80 mg/m2 = 192 mg, Intravenous,  Once, 0 of 12 cycles fosaprepitant (EMEND) 150 mg, dexamethasone (DECADRON) 12 mg in sodium chloride 0.9 % 145 mL IVPB, , Intravenous,  Once, 1 of 4 cycles Administration:  (03/27/2019)  for chemotherapy treatment.      CHIEF COMPLIANT: Cycle 1 Day 8 Adriamycin and Cytoxan  INTERVAL HISTORY: Kathleen Bhargavais a 63y.o. 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 She is currently on adjuvant chemotherapy with dose dense Adriamycin and Cytoxan. She presents to the clinic today for a toxicity check following cycle 1.   REVIEW OF SYSTEMS:   Constitutional: Denies fevers, chills or abnormal weight loss Eyes: Denies blurriness of vision Ears, nose, mouth, throat, and face: Denies mucositis or sore throat Respiratory: Denies cough, dyspnea or wheezes Cardiovascular: Denies palpitation, chest discomfort Gastrointestinal: Denies nausea, heartburn or change in bowel habits Skin: Denies abnormal skin rashes Lymphatics: Denies new lymphadenopathy or easy bruising Neurological: Denies numbness, tingling or new weaknesses Behavioral/Psych: Mood is stable, no new changes  Extremities: No lower extremity edema Breast: denies any pain or lumps or nodules in either breasts All other systems were  reviewed with the patient and are negative.  I have reviewed the past medical history, past surgical history, social history and family history with the patient and they are unchanged from  previous note.  ALLERGIES:  is allergic to latex.  MEDICATIONS:  Current Outpatient Medications  Medication Sig Dispense Refill  . dexamethasone (DECADRON) 4 MG tablet Take 1 tablet day after chemo and 1 tablet 2 days after chemo with food 8 tablet 0  . ibuprofen (ADVIL) 800 MG tablet Take 1 tablet (800 mg total) by mouth every 8 (eight) hours as needed. 30 tablet 0  . lidocaine-prilocaine (EMLA) cream Apply to affected area once 30 g 3  . LORazepam (ATIVAN) 0.5 MG tablet Take 1 tablet (0.5 mg total) by mouth at bedtime as needed (Nausea or vomiting). 30 tablet 0  . ondansetron (ZOFRAN) 8 MG tablet Take 1 tablet (8 mg total) by mouth 2 (two) times daily as needed. Start on the third day after chemotherapy. 30 tablet 1  . prochlorperazine (COMPAZINE) 10 MG tablet Take 1 tablet (10 mg total) by mouth every 6 (six) hours as needed (Nausea or vomiting). 30 tablet 1   No current facility-administered medications for this visit.     PHYSICAL EXAMINATION: ECOG PERFORMANCE STATUS: 1 - Symptomatic but completely ambulatory  There were no vitals filed for this visit. There were no vitals filed for this visit.  GENERAL: alert, no distress and comfortable SKIN: skin color, texture, turgor are normal, no rashes or significant lesions EYES: normal, Conjunctiva are pink and non-injected, sclera clear OROPHARYNX: no exudate, no erythema and lips, buccal mucosa, and tongue normal  NECK: supple, thyroid normal size, non-tender, without nodularity LYMPH: no palpable lymphadenopathy in the cervical, axillary or inguinal LUNGS: clear to auscultation and percussion with normal breathing effort HEART: regular rate & rhythm and no murmurs and no lower extremity edema ABDOMEN: abdomen soft, non-tender and normal bowel sounds MUSCULOSKELETAL: no cyanosis of digits and no clubbing  NEURO: alert & oriented x 3 with fluent speech, no focal motor/sensory deficits EXTREMITIES: No lower extremity edema   LABORATORY DATA:  I have reviewed the data as listed CMP Latest Ref Rng & Units 03/27/2019 02/23/2019 01/03/2019  Glucose 70 - 99 mg/dL 103(H) 101(H) 93  BUN 8 - 23 mg/dL '9 14 12  ' Creatinine 0.44 - 1.00 mg/dL 0.90 0.99 0.95  Sodium 135 - 145 mmol/L 139 141 140  Potassium 3.5 - 5.1 mmol/L 3.8 4.6 3.7  Chloride 98 - 111 mmol/L 107 110 108  CO2 22 - 32 mmol/L 23 18(L) 23  Calcium 8.9 - 10.3 mg/dL 8.7(L) 8.7(L) 8.8(L)  Total Protein 6.5 - 8.1 g/dL 7.4 6.8 7.2  Total Bilirubin 0.3 - 1.2 mg/dL 0.4 0.9 0.4  Alkaline Phos 38 - 126 U/L 85 66 73  AST 15 - 41 U/L '15 23 16  ' ALT 0 - 44 U/L '16 17 15    ' Lab Results  Component Value Date   WBC 2.1 (L) 04/03/2019   HGB 11.0 (L) 04/03/2019   HCT 34.0 (L) 04/03/2019   MCV 70.4 (L) 04/03/2019   PLT 104 (L) 04/03/2019   NEUTROABS PENDING 04/03/2019    ASSESSMENT & PLAN:  Malignant neoplasm of upper-inner quadrant of right breast in female, estrogen receptor negative ([place]) 01/02/2019:Routine screening mammogram detected a 1.2cm mass in the right breast at the 12:30 location 7 cm from the nipple, no axillary adenopathy. Biopsy showed IDC, grade 2-3, HER-2 - (1+), ER -, PR -, Ki67 80%.  03/01/2019:  Right lumpectomy: Grade 3 IDC, 1.4 cm, margins clear, 0/3 lymph nodes negative, triple negative stage IIb  Treatment plan: 1.Adjuvant chemotherapy with dose dense Adriamycin and Cytoxan x4 followed by Taxol weekly x12 2.Followed by radiation therapy ----------------------------------------------------------------------------------------------------------------------------------------------------- Current treatment: Cycle 1 day 8 Adriamycin and Cytoxan. I am starting her on a slightly lower dosage because I am worried about toxicities. Echocardiogram August 2020: EF 60 to 65% Chemo toxicities: 1.  Mild nausea 2.  Mild fatigue 3.  Cytopenias as expected for cycle 1 day 8 We will plan to keep the dosage the same for the next treatment. Return to  clinic in 1 week for cycle 2    No orders of the defined types were placed in this encounter.  The patient has a good understanding of the overall plan. she agrees with it. she will call with any problems that may develop before the next visit here.  Nicholas Lose, MD 04/03/2019  Julious Oka Dorshimer am acting as scribe for Dr. Nicholas Lose.  I have reviewed the above documentation for accuracy and completeness, and I agree with the above.

## 2019-04-03 ENCOUNTER — Encounter: Payer: Self-pay | Admitting: *Deleted

## 2019-04-03 ENCOUNTER — Inpatient Hospital Stay: Payer: BC Managed Care – PPO

## 2019-04-03 ENCOUNTER — Inpatient Hospital Stay (HOSPITAL_BASED_OUTPATIENT_CLINIC_OR_DEPARTMENT_OTHER): Payer: BC Managed Care – PPO | Admitting: Hematology and Oncology

## 2019-04-03 ENCOUNTER — Other Ambulatory Visit: Payer: Self-pay

## 2019-04-03 ENCOUNTER — Other Ambulatory Visit: Payer: BC Managed Care – PPO

## 2019-04-03 DIAGNOSIS — C50211 Malignant neoplasm of upper-inner quadrant of right female breast: Secondary | ICD-10-CM

## 2019-04-03 DIAGNOSIS — Z171 Estrogen receptor negative status [ER-]: Secondary | ICD-10-CM | POA: Diagnosis not present

## 2019-04-03 DIAGNOSIS — Z5111 Encounter for antineoplastic chemotherapy: Secondary | ICD-10-CM | POA: Diagnosis not present

## 2019-04-03 LAB — CMP (CANCER CENTER ONLY)
ALT: 8 U/L (ref 0–44)
AST: 8 U/L — ABNORMAL LOW (ref 15–41)
Albumin: 3.7 g/dL (ref 3.5–5.0)
Alkaline Phosphatase: 91 U/L (ref 38–126)
Anion gap: 8 (ref 5–15)
BUN: 14 mg/dL (ref 8–23)
CO2: 24 mmol/L (ref 22–32)
Calcium: 8.6 mg/dL — ABNORMAL LOW (ref 8.9–10.3)
Chloride: 107 mmol/L (ref 98–111)
Creatinine: 0.91 mg/dL (ref 0.44–1.00)
GFR, Est AFR Am: >60 mL/min (ref >60)
GFR, Estimated: >60 mL/min (ref >60)
Glucose, Bld: 120 mg/dL — ABNORMAL HIGH (ref 70–99)
Potassium: 3.7 mmol/L (ref 3.5–5.1)
Sodium: 139 mmol/L (ref 135–145)
Total Bilirubin: 0.6 mg/dL (ref 0.3–1.2)
Total Protein: 6.8 g/dL (ref 6.5–8.1)

## 2019-04-03 LAB — CBC WITH DIFFERENTIAL (CANCER CENTER ONLY)
Abs Immature Granulocytes: 0.02 10*3/uL (ref 0.00–0.07)
Basophils Absolute: 0 10*3/uL (ref 0.0–0.1)
Basophils Relative: 1 %
Eosinophils Absolute: 0 10*3/uL (ref 0.0–0.5)
Eosinophils Relative: 1 %
HCT: 34 % — ABNORMAL LOW (ref 36.0–46.0)
Hemoglobin: 11 g/dL — ABNORMAL LOW (ref 12.0–15.0)
Immature Granulocytes: 1 %
Lymphocytes Relative: 45 %
Lymphs Abs: 0.9 10*3/uL (ref 0.7–4.0)
MCH: 22.8 pg — ABNORMAL LOW (ref 26.0–34.0)
MCHC: 32.4 g/dL (ref 30.0–36.0)
MCV: 70.4 fL — ABNORMAL LOW (ref 80.0–100.0)
Monocytes Absolute: 0.1 10*3/uL (ref 0.1–1.0)
Monocytes Relative: 4 %
Neutro Abs: 1 10*3/uL — ABNORMAL LOW (ref 1.7–7.7)
Neutrophils Relative %: 48 %
Platelet Count: 104 10*3/uL — ABNORMAL LOW (ref 150–400)
RBC: 4.83 MIL/uL (ref 3.87–5.11)
RDW: 15.4 % (ref 11.5–15.5)
WBC Count: 2.1 10*3/uL — ABNORMAL LOW (ref 4.0–10.5)
nRBC: 0 % (ref 0.0–0.2)

## 2019-04-03 NOTE — Assessment & Plan Note (Signed)
01/02/2019:Routine screening mammogram detected a 1.2cm mass in the right breast at the 12:30 location 7 cm from the nipple, no axillary adenopathy. Biopsy showed IDC, grade 2-3, HER-2 - (1+), ER -, PR -, Ki67 80%.  03/01/2019: Right lumpectomy: Grade 3 IDC, 1.4 cm, margins clear, 0/3 lymph nodes negative, triple negative stage IIb  Treatment plan: 1.Adjuvant chemotherapy with dose dense Adriamycin and Cytoxan x4 followed by Taxol weekly x12 2.Followed by radiation therapy ----------------------------------------------------------------------------------------------------------------------------------------------------- Current treatment: Cycle 1 day 8 Adriamycin and Cytoxan. I am starting her on a slightly lower dosage because I am worried about toxicities. Echocardiogram August 2020: EF 60 to 65% Chemo toxicities:  Return to clinic in 1 week for cycle 2

## 2019-04-09 NOTE — Progress Notes (Signed)
Patient Care Team: [name], MD as PCP - General (Family Medicine) Rockwell Germany, RN as Oncology Nurse Navigator Tressie Ellis, Paulette Blanch, RN as Oncology Nurse Navigator Erroll Luna, MD as Consulting Physician (General Surgery) Nicholas Lose, MD as Consulting Physician (Hematology and Oncology) Kyung Rudd, MD as Consulting Physician (Radiation Oncology)  DIAGNOSIS:    ICD-10-CM   1. Malignant neoplasm of upper-inner quadrant of right breast in female, estrogen receptor negative ([place])  C50.211    Z17.1     SUMMARY OF ONCOLOGIC HISTORY: Oncology History  Malignant neoplasm of upper-inner quadrant of right breast in female, estrogen receptor negative ([place])  01/02/2019 Initial Diagnosis   Routine screening mammogram detected a 1.2cm mass in the right breast at the 12:30 location 7 cm from the nipple, no axillary adenopathy. Biopsy showed IDC, grade 2-3, HER-2 - (1+), ER -, PR -, Ki67 80%.    01/03/2019 Cancer Staging   Staging form: Breast, AJCC 8th Edition - Clinical stage from 01/03/2019: Stage IB (cT1c, cN0, cM0, G3, ER-, PR-, HER2-) - Signed by Nicholas Lose, MD on 01/03/2019   03/01/2019 Surgery   Lumpectomy ([facility]): IDC, grade 3, 1.4cm, clear margins, 3 lymph nodes negative for carcinoma.    03/09/2019 Cancer Staging   Staging form: Breast, AJCC 8th Edition - Pathologic stage from 03/09/2019: Stage IB (pT1c, pN0, cM0, G3, ER-, PR-, HER2-) - Signed by Nicholas Lose, MD on 03/09/2019   03/27/2019 -  Chemotherapy   The patient had DOXOrubicin (ADRIAMYCIN) chemo injection 118 mg, 50 mg/m2 = 118 mg (83.3 % of original dose 60 mg/m2), Intravenous,  Once, 1 of 4 cycles Dose modification: 50 mg/m2 (original dose 60 mg/m2, Cycle 1, Reason: Provider Judgment) Administration: 118 mg (03/27/2019) palonosetron (ALOXI) injection 0.25 mg, 0.25 mg, Intravenous,  Once, 1 of 4 cycles Administration: 0.25 mg (03/27/2019) pegfilgrastim-jmdb (FULPHILA) injection 6 mg, 6 mg, Subcutaneous,  Once, 1  of 4 cycles Administration: 6 mg (03/29/2019) cyclophosphamide (CYTOXAN) 1,180 mg in sodium chloride 0.9 % 250 mL chemo infusion, 500 mg/m2 = 1,180 mg (83.3 % of original dose 600 mg/m2), Intravenous,  Once, 1 of 4 cycles Dose modification: 500 mg/m2 (original dose 600 mg/m2, Cycle 1, Reason: Provider Judgment) Administration: 1,180 mg (03/27/2019) PACLitaxel (TAXOL) 192 mg in sodium chloride 0.9 % 250 mL chemo infusion (</= 52m/m2), 80 mg/m2 = 192 mg, Intravenous,  Once, 0 of 12 cycles fosaprepitant (EMEND) 150 mg, dexamethasone (DECADRON) 12 mg in sodium chloride 0.9 % 145 mL IVPB, , Intravenous,  Once, 1 of 4 cycles Administration:  (03/27/2019)  for chemotherapy treatment.      CHIEF COMPLIANT: Cycle 2 Adriamycin and Cytoxan  INTERVAL HISTORY: Kathleen Potter a 63y.o. 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 She is currently on adjuvant chemotherapy with dose dense Adriamycin and Cytoxan.She presents to the clinic todayfor cycle 2.   REVIEW OF SYSTEMS:   Constitutional: Denies fevers, chills or abnormal weight loss Eyes: Denies blurriness of vision Ears, nose, mouth, throat, and face: Denies mucositis or sore throat Respiratory: Denies cough, dyspnea or wheezes Cardiovascular: Denies palpitation, chest discomfort Gastrointestinal: Denies nausea, heartburn or change in bowel habits Skin: Denies abnormal skin rashes Lymphatics: Denies new lymphadenopathy or easy bruising Neurological: Denies numbness, tingling or new weaknesses Behavioral/Psych: Mood is stable, no new changes  Extremities: No lower extremity edema Breast: denies any pain or lumps or nodules in either breasts All other systems were reviewed with the patient and are negative.  I have reviewed the past medical history, past surgical history, social history and family history with the patient and they are unchanged from previous note.  ALLERGIES:  is  allergic to latex.  MEDICATIONS:  Current Outpatient Medications  Medication Sig Dispense Refill  . dexamethasone (DECADRON) 4 MG tablet Take 1 tablet day after chemo and 1 tablet 2 days after chemo with food 8 tablet 0  . ibuprofen (ADVIL) 800 MG tablet Take 1 tablet (800 mg total) by mouth every 8 (eight) hours as needed. 30 tablet 0  . lidocaine-prilocaine (EMLA) cream Apply to affected area once 30 g 3  . LORazepam (ATIVAN) 0.5 MG tablet Take 1 tablet (0.5 mg total) by mouth at bedtime as needed (Nausea or vomiting). 30 tablet 0  . ondansetron (ZOFRAN) 8 MG tablet Take 1 tablet (8 mg total) by mouth 2 (two) times daily as needed. Start on the third day after chemotherapy. 30 tablet 1  . prochlorperazine (COMPAZINE) 10 MG tablet Take 1 tablet (10 mg total) by mouth every 6 (six) hours as needed (Nausea or vomiting). 30 tablet 1   No current facility-administered medications for this visit.     PHYSICAL EXAMINATION: ECOG PERFORMANCE STATUS: 1 - Symptomatic but completely ambulatory  Vitals:   04/10/19 0918  BP: 125/65  Pulse: 78  Resp: 18  Temp: 98.3 F (36.8 C)  SpO2: 100%   Filed Weights   04/10/19 0918  Weight: 266 lb 3.2 oz (120.7 kg)    GENERAL: alert, no distress and comfortable SKIN: skin color, texture, turgor are normal, no rashes or significant lesions EYES: normal, Conjunctiva are pink and non-injected, sclera clear OROPHARYNX: no exudate, no erythema and lips, buccal mucosa, and tongue normal  NECK: supple, thyroid normal size, non-tender, without nodularity LYMPH: no palpable lymphadenopathy in the cervical, axillary or inguinal LUNGS: clear to auscultation and percussion with normal breathing effort HEART: regular rate & rhythm and no murmurs and no lower extremity edema ABDOMEN: abdomen soft, non-tender and normal bowel sounds MUSCULOSKELETAL: no cyanosis of digits and no clubbing  NEURO: alert & oriented x 3 with fluent speech, no focal motor/sensory  deficits EXTREMITIES: No lower extremity edema  LABORATORY DATA:  I have reviewed the data as listed CMP Latest Ref Rng & Units 04/03/2019 03/27/2019 02/23/2019  Glucose 70 - 99 mg/dL 120(H) 103(H) 101(H)  BUN 8 - 23 mg/dL '14 9 14  ' Creatinine 0.44 - 1.00 mg/dL 0.91 0.90 0.99  Sodium 135 - 145 mmol/L 139 139 141  Potassium 3.5 - 5.1 mmol/L 3.7 3.8 4.6  Chloride 98 - 111 mmol/L 107 107 110  CO2 22 - 32 mmol/L 24 23 18(L)  Calcium 8.9 - 10.3 mg/dL 8.6(L) 8.7(L) 8.7(L)  Total Protein 6.5 - 8.1 g/dL 6.8 7.4 6.8  Total Bilirubin 0.3 - 1.2 mg/dL 0.6 0.4 0.9  Alkaline Phos 38 - 126 U/L 91 85 66  AST 15 - 41 U/L 8(L) 15 23  ALT 0 - 44 U/L '8 16 17    ' Lab Results  Component Value Date   WBC 7.0 04/10/2019   HGB 10.7 (L) 04/10/2019   HCT 34.2 (L) 04/10/2019   MCV 72.6 (L) 04/10/2019   PLT 190 04/10/2019   NEUTROABS 4.2 04/10/2019    ASSESSMENT & PLAN:  Malignant neoplasm of upper-inner quadrant of right breast in female, estrogen receptor negative ([place]) 01/02/2019:Routine screening mammogram detected a 1.2cm mass in the right breast at the 12:30 location 7 cm from the nipple, no axillary adenopathy. Biopsy  showed IDC, grade 2-3, HER-2 - (1+), ER -, PR -, Ki67 80%.  03/01/2019: Right lumpectomy: Grade 3 IDC, 1.4 cm, margins clear, 0/3 lymph nodes negative, triple negative stage IIb  Treatment plan: 1.Adjuvant chemotherapy with dose dense Adriamycin and Cytoxan x4 followed by Taxol weekly x12 2.Followed by radiation therapy ----------------------------------------------------------------------------------------------------------------------------------------------------- Current treatment: Cycle 2 Adriamycin and Cytoxan. I am starting her on a slightly lower dosage because I am worried about toxicities. Echocardiogram August 2020: EF 60 to 65%  Chemo toxicities: 1.  Mild nausea 2.  Mild fatigue 3.  Cytopenias as expected for cycle 1 day 8 We will plan to keep the dosage the same  for the next treatment.  SWOG 1714: Patient was counseled about participating in a prospective observational study to develop predictive model of taxane based peripheral neuropathy in early stage breast cancer treated with taxanes.  Patient's participate in sensory testing with neuro pen and tuning fork, submit blood specimens and answer questionnaires. I discussed with her about the trial and we will provide her with more information for her to make a decision on participation. Return to clinic in 2 weeks for cycle 3    No orders of the defined types were placed in this encounter.  The patient has a good understanding of the overall plan. she agrees with it. she will call with any problems that may develop before the next visit here.  Nicholas Lose, MD 04/10/2019  Julious Oka Dorshimer, am acting as scribe for Dr. Nicholas Lose.  I have reviewed the above documentation for accuracy and completeness, and I agree with the above.

## 2019-04-10 ENCOUNTER — Inpatient Hospital Stay: Payer: BC Managed Care – PPO

## 2019-04-10 ENCOUNTER — Other Ambulatory Visit: Payer: Self-pay

## 2019-04-10 ENCOUNTER — Inpatient Hospital Stay (HOSPITAL_BASED_OUTPATIENT_CLINIC_OR_DEPARTMENT_OTHER): Payer: BC Managed Care – PPO | Admitting: Hematology and Oncology

## 2019-04-10 ENCOUNTER — Encounter: Payer: Self-pay | Admitting: *Deleted

## 2019-04-10 DIAGNOSIS — Z171 Estrogen receptor negative status [ER-]: Secondary | ICD-10-CM

## 2019-04-10 DIAGNOSIS — C50211 Malignant neoplasm of upper-inner quadrant of right female breast: Secondary | ICD-10-CM

## 2019-04-10 DIAGNOSIS — Z95828 Presence of other vascular implants and grafts: Secondary | ICD-10-CM

## 2019-04-10 DIAGNOSIS — Z5111 Encounter for antineoplastic chemotherapy: Secondary | ICD-10-CM | POA: Diagnosis not present

## 2019-04-10 LAB — CBC WITH DIFFERENTIAL (CANCER CENTER ONLY)
Abs Immature Granulocytes: 0.32 10*3/uL — ABNORMAL HIGH (ref 0.00–0.07)
Basophils Absolute: 0 10*3/uL (ref 0.0–0.1)
Basophils Relative: 1 %
Eosinophils Absolute: 0 10*3/uL (ref 0.0–0.5)
Eosinophils Relative: 0 %
HCT: 34.2 % — ABNORMAL LOW (ref 36.0–46.0)
Hemoglobin: 10.7 g/dL — ABNORMAL LOW (ref 12.0–15.0)
Immature Granulocytes: 5 %
Lymphocytes Relative: 24 %
Lymphs Abs: 1.7 10*3/uL (ref 0.7–4.0)
MCH: 22.7 pg — ABNORMAL LOW (ref 26.0–34.0)
MCHC: 31.3 g/dL (ref 30.0–36.0)
MCV: 72.6 fL — ABNORMAL LOW (ref 80.0–100.0)
Monocytes Absolute: 0.7 10*3/uL (ref 0.1–1.0)
Monocytes Relative: 11 %
Neutro Abs: 4.2 10*3/uL (ref 1.7–7.7)
Neutrophils Relative %: 59 %
Platelet Count: 190 10*3/uL (ref 150–400)
RBC: 4.71 MIL/uL (ref 3.87–5.11)
RDW: 16.4 % — ABNORMAL HIGH (ref 11.5–15.5)
WBC Count: 7 10*3/uL (ref 4.0–10.5)
nRBC: 0.7 % — ABNORMAL HIGH (ref 0.0–0.2)

## 2019-04-10 LAB — CMP (CANCER CENTER ONLY)
ALT: 12 U/L (ref 0–44)
AST: 10 U/L — ABNORMAL LOW (ref 15–41)
Albumin: 3.7 g/dL (ref 3.5–5.0)
Alkaline Phosphatase: 87 U/L (ref 38–126)
Anion gap: 13 (ref 5–15)
BUN: 20 mg/dL (ref 8–23)
CO2: 19 mmol/L — ABNORMAL LOW (ref 22–32)
Calcium: 8.8 mg/dL — ABNORMAL LOW (ref 8.9–10.3)
Chloride: 111 mmol/L (ref 98–111)
Creatinine: 0.96 mg/dL (ref 0.44–1.00)
GFR, Est AFR Am: >60 mL/min (ref >60)
GFR, Estimated: >60 mL/min (ref >60)
Glucose, Bld: 118 mg/dL — ABNORMAL HIGH (ref 70–99)
Potassium: 3.9 mmol/L (ref 3.5–5.1)
Sodium: 143 mmol/L (ref 135–145)
Total Bilirubin: 0.2 mg/dL — ABNORMAL LOW (ref 0.3–1.2)
Total Protein: 6.8 g/dL (ref 6.5–8.1)

## 2019-04-10 MED ORDER — PALONOSETRON HCL INJECTION 0.25 MG/5ML
0.2500 mg | Freq: Once | INTRAVENOUS | Status: AC
  Administered 2019-04-10: 0.25 mg via INTRAVENOUS

## 2019-04-10 MED ORDER — SODIUM CHLORIDE 0.9 % IV SOLN
Freq: Once | INTRAVENOUS | Status: AC
  Administered 2019-04-10: 10:00:00 via INTRAVENOUS
  Filled 2019-04-10: qty 5

## 2019-04-10 MED ORDER — DOXORUBICIN HCL CHEMO IV INJECTION 2 MG/ML
50.0000 mg/m2 | Freq: Once | INTRAVENOUS | Status: AC
  Administered 2019-04-10: 118 mg via INTRAVENOUS
  Filled 2019-04-10: qty 59

## 2019-04-10 MED ORDER — PALONOSETRON HCL INJECTION 0.25 MG/5ML
INTRAVENOUS | Status: AC
  Filled 2019-04-10: qty 5

## 2019-04-10 MED ORDER — SODIUM CHLORIDE 0.9 % IV SOLN
500.0000 mg/m2 | Freq: Once | INTRAVENOUS | Status: AC
  Administered 2019-04-10: 1180 mg via INTRAVENOUS
  Filled 2019-04-10: qty 59

## 2019-04-10 MED ORDER — SODIUM CHLORIDE 0.9 % IV SOLN
Freq: Once | INTRAVENOUS | Status: AC
  Administered 2019-04-10: 10:00:00 via INTRAVENOUS
  Filled 2019-04-10: qty 250

## 2019-04-10 MED ORDER — SODIUM CHLORIDE 0.9% FLUSH
10.0000 mL | INTRAVENOUS | Status: DC | PRN
  Administered 2019-04-10: 09:00:00 10 mL via INTRAVENOUS
  Filled 2019-04-10: qty 10

## 2019-04-10 MED ORDER — HEPARIN SOD (PORK) LOCK FLUSH 100 UNIT/ML IV SOLN
500.0000 [IU] | Freq: Once | INTRAVENOUS | Status: AC | PRN
  Administered 2019-04-10: 500 [IU]
  Filled 2019-04-10: qty 5

## 2019-04-10 MED ORDER — SODIUM CHLORIDE 0.9% FLUSH
10.0000 mL | INTRAVENOUS | Status: DC | PRN
  Administered 2019-04-10: 12:00:00 10 mL
  Filled 2019-04-10: qty 10

## 2019-04-10 NOTE — Patient Instructions (Signed)
[facility] Discharge Instructions for Patients Receiving Chemotherapy  Today you received the following chemotherapy agents Adriamycin; Cytoxan  To help prevent nausea and vomiting after your treatment, we encourage you to take your nausea medication as directed   If you develop nausea and vomiting that is not controlled by your nausea medication, call the clinic.   BELOW ARE SYMPTOMS THAT SHOULD BE REPORTED IMMEDIATELY:  *FEVER GREATER THAN 100.5 F  *CHILLS WITH OR WITHOUT FEVER  NAUSEA AND VOMITING THAT IS NOT CONTROLLED WITH YOUR NAUSEA MEDICATION  *UNUSUAL SHORTNESS OF BREATH  *UNUSUAL BRUISING OR BLEEDING  TENDERNESS IN MOUTH AND THROAT WITH OR WITHOUT PRESENCE OF ULCERS  *URINARY PROBLEMS  *BOWEL PROBLEMS  UNUSUAL RASH Items with * indicate a potential emergency and should be followed up as soon as possible.  Feel free to call the clinic should you have any questions or concerns. The clinic phone number is [phone].  Please show the CHEMO ALERT CARD at check-in to the Emergency Department and triage nurse.   

## 2019-04-10 NOTE — Assessment & Plan Note (Signed)
01/02/2019:Routine screening mammogram detected a 1.2cm mass in the right breast at the 12:30 location 7 cm from the nipple, no axillary adenopathy. Biopsy showed IDC, grade 2-3, HER-2 - (1+), ER -, PR -, Ki67 80%.  03/01/2019: Right lumpectomy: Grade 3 IDC, 1.4 cm, margins clear, 0/3 lymph nodes negative, triple negative stage IIb  Treatment plan: 1.Adjuvant chemotherapy with dose dense Adriamycin and Cytoxan x4 followed by Taxol weekly x12 2.Followed by radiation therapy ----------------------------------------------------------------------------------------------------------------------------------------------------- Current treatment: Cycle 2 Adriamycin and Cytoxan. I am starting her on a slightly lower dosage because I am worried about toxicities. Echocardiogram August 2020: EF 60 to 65%  Chemo toxicities: 1.  Mild nausea 2.  Mild fatigue 3.  Cytopenias as expected for cycle 1 day 8 We will plan to keep the dosage the same for the next treatment.  Return to clinic in 2 weeks for cycle 3

## 2019-04-10 NOTE — Research (Signed)
[id number], A PROSPECTIVE OBSERVATIONAL COHORT STUDY TO DEVELOP A PREDICTIVE MODEL OF TAXANE-INDUCED PERIPHERAL NEUROPATHY IN CANCER PATIENTS. Dr. Lindi Adie referred patient for the above study. Met with patient in infusion room for 5 minutes.  Briefly explained the purpose of the study along with potential risks and benefits of participation.  Reviewed the study required assessments and timeline for completing these assessments. Informed patient that participation is completely voluntary.  Provided patient with copy of consents and hippa forms for this study.  Patient stated she would review and think about it and that it was ok for research nurse to follow up with her during her next chemo treatment in 2 weeks. Gave patient a clinical trials informational brochure and my business card. Encouraged patient to call research nurse if any questions prior to next visit. Patient verbalized understanding.  Thanked patient for her time and willingness to discuss the study.  Foye Spurling, BSN, RN Clinical Research Nurse 04/10/2019 11:10 AM

## 2019-04-12 ENCOUNTER — Other Ambulatory Visit: Payer: Self-pay

## 2019-04-12 ENCOUNTER — Inpatient Hospital Stay: Payer: BC Managed Care – PPO

## 2019-04-12 VITALS — BP 138/86 | HR 60 | Temp 98.0°F

## 2019-04-12 DIAGNOSIS — C50211 Malignant neoplasm of upper-inner quadrant of right female breast: Secondary | ICD-10-CM

## 2019-04-12 DIAGNOSIS — Z5111 Encounter for antineoplastic chemotherapy: Secondary | ICD-10-CM | POA: Diagnosis not present

## 2019-04-12 MED ORDER — PEGFILGRASTIM-JMDB 6 MG/0.6ML ~~LOC~~ SOSY
PREFILLED_SYRINGE | SUBCUTANEOUS | Status: AC
  Filled 2019-04-12: qty 0.6

## 2019-04-12 MED ORDER — PEGFILGRASTIM-JMDB 6 MG/0.6ML ~~LOC~~ SOSY
6.0000 mg | PREFILLED_SYRINGE | Freq: Once | SUBCUTANEOUS | Status: AC
  Administered 2019-04-12: 6 mg via SUBCUTANEOUS

## 2019-04-12 NOTE — Patient Instructions (Signed)

## 2019-04-23 NOTE — Progress Notes (Signed)
Patient Care Team: [name], MD as PCP - General (Family Medicine) Rockwell Germany, RN as Oncology Nurse Navigator Kathleen Potter, Kathleen Blanch, RN as Oncology Nurse Navigator Kathleen Luna, MD as Consulting Physician (General Surgery) Kathleen Lose, MD as Consulting Physician (Hematology and Oncology) Kathleen Rudd, MD as Consulting Physician (Radiation Oncology)  DIAGNOSIS:    ICD-10-CM   1. Malignant neoplasm of upper-inner quadrant of right breast in female, estrogen receptor negative ([place])  C50.211    Z17.1     SUMMARY OF ONCOLOGIC HISTORY: Oncology History  Malignant neoplasm of upper-inner quadrant of right breast in female, estrogen receptor negative ([place])  01/02/2019 Initial Diagnosis   Routine screening mammogram detected a 1.2cm mass in the right breast at the 12:30 location 7 cm from the nipple, no axillary adenopathy. Biopsy showed IDC, grade 2-3, HER-2 - (1+), ER -, PR -, Ki67 80%.    01/03/2019 Cancer Staging   Staging form: Breast, AJCC 8th Edition - Clinical stage from 01/03/2019: Stage IB (cT1c, cN0, cM0, G3, ER-, PR-, HER2-) - Signed by Kathleen Lose, MD on 01/03/2019   03/01/2019 Surgery   Lumpectomy ([facility]): IDC, grade 3, 1.4cm, clear margins, 3 lymph nodes negative for carcinoma.    03/09/2019 Cancer Staging   Staging form: Breast, AJCC 8th Edition - Pathologic stage from 03/09/2019: Stage IB (pT1c, pN0, cM0, G3, ER-, PR-, HER2-) - Signed by Kathleen Lose, MD on 03/09/2019   03/27/2019 -  Chemotherapy   The patient had DOXOrubicin (ADRIAMYCIN) chemo injection 118 mg, 50 mg/m2 = 118 mg (83.3 % of original dose 60 mg/m2), Intravenous,  Once, 2 of 4 cycles Dose modification: 50 mg/m2 (original dose 60 mg/m2, Cycle 1, Reason: Provider Judgment) Administration: 118 mg (03/27/2019), 118 mg (04/10/2019) palonosetron (ALOXI) injection 0.25 mg, 0.25 mg, Intravenous,  Once, 2 of 4 cycles Administration: 0.25 mg (03/27/2019), 0.25 mg (04/10/2019) pegfilgrastim-jmdb (FULPHILA)  injection 6 mg, 6 mg, Subcutaneous,  Once, 2 of 4 cycles Administration: 6 mg (03/29/2019), 6 mg (04/12/2019) cyclophosphamide (CYTOXAN) 1,180 mg in sodium chloride 0.9 % 250 mL chemo infusion, 500 mg/m2 = 1,180 mg (83.3 % of original dose 600 mg/m2), Intravenous,  Once, 2 of 4 cycles Dose modification: 500 mg/m2 (original dose 600 mg/m2, Cycle 1, Reason: Provider Judgment) Administration: 1,180 mg (03/27/2019), 1,180 mg (04/10/2019) PACLitaxel (TAXOL) 192 mg in sodium chloride 0.9 % 250 mL chemo infusion (</= 15m/m2), 80 mg/m2 = 192 mg, Intravenous,  Once, 0 of 12 cycles fosaprepitant (EMEND) 150 mg, dexamethasone (DECADRON) 12 mg in sodium chloride 0.9 % 145 mL IVPB, , Intravenous,  Once, 2 of 4 cycles Administration:  (03/27/2019),  (04/10/2019)  for chemotherapy treatment.      CHIEF COMPLIANT: Cycle 3Adriamycin and Cytoxan  INTERVAL HISTORY: Kathleen Potter a 63y.o. 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 She is currently on adjuvant chemotherapy with dose dense Adriamycin and Cytoxan.She presents to the clinic todayforcycle 3.  She had several more days of nausea with this treatment as well as fatigue.  REVIEW OF SYSTEMS:   Constitutional: Denies fevers, chills or abnormal weight loss, fatigue Eyes: Denies blurriness of vision Ears, nose, mouth, throat, and face: Denies mucositis or sore throat Respiratory: Denies cough, dyspnea or wheezes Cardiovascular: Denies palpitation, chest discomfort Gastrointestinal: Nausea and decreased taste Skin: Denies abnormal skin rashes Lymphatics: Denies new lymphadenopathy or easy bruising Neurological: Denies numbness, tingling or new weaknesses Behavioral/Psych: Mood is stable, no new changes  Extremities: No lower extremity edema  Breast: denies any pain or lumps or nodules in either breasts All other systems were reviewed with the patient and are negative.  I have reviewed the  past medical history, past surgical history, social history and family history with the patient and they are unchanged from previous note.  ALLERGIES:  is allergic to latex.  MEDICATIONS:  Current Outpatient Medications  Medication Sig Dispense Refill  . dexamethasone (DECADRON) 4 MG tablet Take 1 tablet day after chemo and 1 tablet 2 days after chemo with food 8 tablet 0  . ibuprofen (ADVIL) 800 MG tablet Take 1 tablet (800 mg total) by mouth every 8 (eight) hours as needed. 30 tablet 0  . lidocaine-prilocaine (EMLA) cream Apply to affected area once 30 g 3  . LORazepam (ATIVAN) 0.5 MG tablet Take 1 tablet (0.5 mg total) by mouth at bedtime as needed (Nausea or vomiting). 30 tablet 0  . ondansetron (ZOFRAN) 8 MG tablet Take 1 tablet (8 mg total) by mouth 2 (two) times daily as needed. Start on the third day after chemotherapy. 30 tablet 1  . prochlorperazine (COMPAZINE) 10 MG tablet Take 1 tablet (10 mg total) by mouth every 6 (six) hours as needed (Nausea or vomiting). 30 tablet 1   No current facility-administered medications for this visit.     PHYSICAL EXAMINATION: ECOG PERFORMANCE STATUS: 2 - Symptomatic, <50% confined to bed  Vitals:   04/24/19 1137  BP: (!) 141/73  Pulse: 75  Resp: 17  Temp: 98.3 F (36.8 C)  SpO2: 100%   Filed Weights   04/24/19 1137  Weight: 264 lb 6.4 oz (119.9 kg)    GENERAL: alert, no distress and comfortable SKIN: skin color, texture, turgor are normal, no rashes or significant lesions EYES: normal, Conjunctiva are pink and non-injected, sclera clear OROPHARYNX: no exudate, no erythema and lips, buccal mucosa, and tongue normal  NECK: supple, thyroid normal size, non-tender, without nodularity LYMPH: no palpable lymphadenopathy in the cervical, axillary or inguinal LUNGS: clear to auscultation and percussion with normal breathing effort HEART: regular rate & rhythm and no murmurs and no lower extremity edema ABDOMEN: abdomen soft, non-tender  and normal bowel sounds MUSCULOSKELETAL: no cyanosis of digits and no clubbing  NEURO: alert & oriented x 3 with fluent speech, no focal motor/sensory deficits EXTREMITIES: No lower extremity edema  LABORATORY DATA:  I have reviewed the data as listed CMP Latest Ref Rng & Units 04/10/2019 04/03/2019 03/27/2019  Glucose 70 - 99 mg/dL 118(H) 120(H) 103(H)  BUN 8 - 23 mg/dL '20 14 9  ' Creatinine 0.44 - 1.00 mg/dL 0.96 0.91 0.90  Sodium 135 - 145 mmol/L 143 139 139  Potassium 3.5 - 5.1 mmol/L 3.9 3.7 3.8  Chloride 98 - 111 mmol/L 111 107 107  CO2 22 - 32 mmol/L 19(L) 24 23  Calcium 8.9 - 10.3 mg/dL 8.8(L) 8.6(L) 8.7(L)  Total Protein 6.5 - 8.1 g/dL 6.8 6.8 7.4  Total Bilirubin 0.3 - 1.2 mg/dL 0.2(L) 0.6 0.4  Alkaline Phos 38 - 126 U/L 87 91 85  AST 15 - 41 U/L 10(L) 8(L) 15  ALT 0 - 44 U/L '12 8 16    ' Lab Results  Component Value Date   WBC 6.7 04/24/2019   HGB 10.4 (L) 04/24/2019   HCT 32.6 (L) 04/24/2019   MCV 71.6 (L) 04/24/2019   PLT 167 04/24/2019   NEUTROABS 4.7 04/24/2019    ASSESSMENT & PLAN:  Malignant neoplasm of upper-inner quadrant of right breast in female, estrogen receptor negative (  [place]) 01/02/2019:Routine screening mammogram detected a 1.2cm mass in the right breast at the 12:30 location 7 cm from the nipple, no axillary adenopathy. Biopsy showed IDC, grade 2-3, HER-2 - (1+), ER -, PR -, Ki67 80%.  03/01/2019: Right lumpectomy: Grade 3 IDC, 1.4 cm, margins clear, 0/3 lymph nodes negative, triple negative stage IIb  Treatment plan: 1.Adjuvant chemotherapy with dose dense Adriamycin and Cytoxan x4 followed by Taxol weekly x12 2.Followed by radiation therapy  Patient enrolled in SWOG 1714 clinical trial ----------------------------------------------------------------------------------------------------------------------------------------------------- Current treatment: Cycle 3Adriamycin and Cytoxan. I am starting her on a slightly lower dosage because I am  worried about toxicities. Echocardiogram August 2020: EF 60 to 65%  Chemo toxicities: 1.Nausea 2.fatigue lasting more days than before 3.Chemotherapy-induced anemia We will reduce the dosage of today's chemotherapy because of several days of fatigue and nausea  Return to clinic in 2 weeks for cycle 4    No orders of the defined types were placed in this encounter.  The patient has a good understanding of the overall plan. she agrees with it. she will call with any problems that may develop before the next visit here.  Kathleen Lose, MD 04/24/2019  Julious Oka Dorshimer, am acting as scribe for Dr. Nicholas Potter.  I have reviewed the above documentation for accuracy and completeness, and I agree with the above.

## 2019-04-24 ENCOUNTER — Inpatient Hospital Stay: Payer: BC Managed Care – PPO

## 2019-04-24 ENCOUNTER — Encounter: Payer: Self-pay | Admitting: *Deleted

## 2019-04-24 ENCOUNTER — Inpatient Hospital Stay: Payer: BC Managed Care – PPO | Attending: Hematology and Oncology

## 2019-04-24 ENCOUNTER — Inpatient Hospital Stay (HOSPITAL_BASED_OUTPATIENT_CLINIC_OR_DEPARTMENT_OTHER): Payer: BC Managed Care – PPO | Admitting: Hematology and Oncology

## 2019-04-24 ENCOUNTER — Other Ambulatory Visit: Payer: Self-pay

## 2019-04-24 DIAGNOSIS — Z95828 Presence of other vascular implants and grafts: Secondary | ICD-10-CM

## 2019-04-24 DIAGNOSIS — Z5111 Encounter for antineoplastic chemotherapy: Secondary | ICD-10-CM | POA: Diagnosis not present

## 2019-04-24 DIAGNOSIS — C50211 Malignant neoplasm of upper-inner quadrant of right female breast: Secondary | ICD-10-CM | POA: Insufficient documentation

## 2019-04-24 DIAGNOSIS — Z171 Estrogen receptor negative status [ER-]: Secondary | ICD-10-CM

## 2019-04-24 DIAGNOSIS — Z5189 Encounter for other specified aftercare: Secondary | ICD-10-CM | POA: Diagnosis not present

## 2019-04-24 LAB — CBC WITH DIFFERENTIAL (CANCER CENTER ONLY)
Abs Immature Granulocytes: 0.14 10*3/uL — ABNORMAL HIGH (ref 0.00–0.07)
Basophils Absolute: 0 10*3/uL (ref 0.0–0.1)
Basophils Relative: 1 %
Eosinophils Absolute: 0 10*3/uL (ref 0.0–0.5)
Eosinophils Relative: 0 %
HCT: 32.6 % — ABNORMAL LOW (ref 36.0–46.0)
Hemoglobin: 10.4 g/dL — ABNORMAL LOW (ref 12.0–15.0)
Immature Granulocytes: 2 %
Lymphocytes Relative: 18 %
Lymphs Abs: 1.2 10*3/uL (ref 0.7–4.0)
MCH: 22.9 pg — ABNORMAL LOW (ref 26.0–34.0)
MCHC: 31.9 g/dL (ref 30.0–36.0)
MCV: 71.6 fL — ABNORMAL LOW (ref 80.0–100.0)
Monocytes Absolute: 0.6 10*3/uL (ref 0.1–1.0)
Monocytes Relative: 9 %
Neutro Abs: 4.7 10*3/uL (ref 1.7–7.7)
Neutrophils Relative %: 70 %
Platelet Count: 167 10*3/uL (ref 150–400)
RBC: 4.55 MIL/uL (ref 3.87–5.11)
RDW: 17.9 % — ABNORMAL HIGH (ref 11.5–15.5)
WBC Count: 6.7 10*3/uL (ref 4.0–10.5)
nRBC: 0.5 % — ABNORMAL HIGH (ref 0.0–0.2)

## 2019-04-24 LAB — CMP (CANCER CENTER ONLY)
ALT: 14 U/L (ref 0–44)
AST: 12 U/L — ABNORMAL LOW (ref 15–41)
Albumin: 3.6 g/dL (ref 3.5–5.0)
Alkaline Phosphatase: 85 U/L (ref 38–126)
Anion gap: 10 (ref 5–15)
BUN: 12 mg/dL (ref 8–23)
CO2: 24 mmol/L (ref 22–32)
Calcium: 8.7 mg/dL — ABNORMAL LOW (ref 8.9–10.3)
Chloride: 109 mmol/L (ref 98–111)
Creatinine: 0.81 mg/dL (ref 0.44–1.00)
GFR, Est AFR Am: >60 mL/min (ref >60)
GFR, Estimated: >60 mL/min (ref >60)
Glucose, Bld: 102 mg/dL — ABNORMAL HIGH (ref 70–99)
Potassium: 3.6 mmol/L (ref 3.5–5.1)
Sodium: 143 mmol/L (ref 135–145)
Total Bilirubin: 0.2 mg/dL — ABNORMAL LOW (ref 0.3–1.2)
Total Protein: 6.4 g/dL — ABNORMAL LOW (ref 6.5–8.1)

## 2019-04-24 MED ORDER — SODIUM CHLORIDE 0.9% FLUSH
10.0000 mL | INTRAVENOUS | Status: DC | PRN
  Administered 2019-04-24: 10 mL via INTRAVENOUS
  Filled 2019-04-24: qty 10

## 2019-04-24 MED ORDER — SODIUM CHLORIDE 0.9 % IV SOLN
Freq: Once | INTRAVENOUS | Status: AC
  Administered 2019-04-24: 13:00:00 via INTRAVENOUS
  Filled 2019-04-24: qty 5

## 2019-04-24 MED ORDER — SODIUM CHLORIDE 0.9 % IV SOLN
450.0000 mg/m2 | Freq: Once | INTRAVENOUS | Status: AC
  Administered 2019-04-24: 1060 mg via INTRAVENOUS
  Filled 2019-04-24: qty 53

## 2019-04-24 MED ORDER — PALONOSETRON HCL INJECTION 0.25 MG/5ML
INTRAVENOUS | Status: AC
  Filled 2019-04-24: qty 5

## 2019-04-24 MED ORDER — SODIUM CHLORIDE 0.9% FLUSH
10.0000 mL | INTRAVENOUS | Status: DC | PRN
  Administered 2019-04-24: 10 mL
  Filled 2019-04-24: qty 10

## 2019-04-24 MED ORDER — SODIUM CHLORIDE 0.9 % IV SOLN
Freq: Once | INTRAVENOUS | Status: AC
  Administered 2019-04-24: 12:00:00 via INTRAVENOUS
  Filled 2019-04-24: qty 250

## 2019-04-24 MED ORDER — DOXORUBICIN HCL CHEMO IV INJECTION 2 MG/ML
45.0000 mg/m2 | Freq: Once | INTRAVENOUS | Status: AC
  Administered 2019-04-24: 106 mg via INTRAVENOUS
  Filled 2019-04-24: qty 53

## 2019-04-24 MED ORDER — HEPARIN SOD (PORK) LOCK FLUSH 100 UNIT/ML IV SOLN
500.0000 [IU] | Freq: Once | INTRAVENOUS | Status: AC | PRN
  Administered 2019-04-24: 500 [IU]
  Filled 2019-04-24: qty 5

## 2019-04-24 MED ORDER — PALONOSETRON HCL INJECTION 0.25 MG/5ML
0.2500 mg | Freq: Once | INTRAVENOUS | Status: AC
  Administered 2019-04-24: 0.25 mg via INTRAVENOUS

## 2019-04-24 NOTE — Progress Notes (Signed)
[id number] Study; Met with patient briefly in clinic to follow up on her interest in the S1714 trial. Patient states she has read about 2/3 of the consent and plans to finish it by her next visit. She says at this point she is interested in participating. Patient agreed to meet tentatively after her injection appointment on 12/17 to sign consent if she decides to enroll. Research nurse will plan to touch base with patient on 12/15 during her next appointment to confirm. Encouraged patient to call if any questions before next visit. She has my business card and she verbalized understanding. Thanked patient for her time today.  Foye Spurling, BSN, RN Clinical Research Nurse 04/24/2019 2:52 PM

## 2019-04-24 NOTE — Patient Instructions (Signed)
[facility] Discharge Instructions for Patients Receiving Chemotherapy  Today you received the following chemotherapy agents Adriamycin; Cytoxan  To help prevent nausea and vomiting after your treatment, we encourage you to take your nausea medication as directed   If you develop nausea and vomiting that is not controlled by your nausea medication, call the clinic.   BELOW ARE SYMPTOMS THAT SHOULD BE REPORTED IMMEDIATELY:  *FEVER GREATER THAN 100.5 F  *CHILLS WITH OR WITHOUT FEVER  NAUSEA AND VOMITING THAT IS NOT CONTROLLED WITH YOUR NAUSEA MEDICATION  *UNUSUAL SHORTNESS OF BREATH  *UNUSUAL BRUISING OR BLEEDING  TENDERNESS IN MOUTH AND THROAT WITH OR WITHOUT PRESENCE OF ULCERS  *URINARY PROBLEMS  *BOWEL PROBLEMS  UNUSUAL RASH Items with * indicate a potential emergency and should be followed up as soon as possible.  Feel free to call the clinic should you have any questions or concerns. The clinic phone number is [phone].  Please show the CHEMO ALERT CARD at check-in to the Emergency Department and triage nurse.   

## 2019-04-24 NOTE — Progress Notes (Signed)
Blood return noted before, during, and after Adriamycin administration. 

## 2019-04-24 NOTE — Assessment & Plan Note (Signed)
01/02/2019:Routine screening mammogram detected a 1.2cm mass in the right breast at the 12:30 location 7 cm from the nipple, no axillary adenopathy. Biopsy showed IDC, grade 2-3, HER-2 - (1+), ER -, PR -, Ki67 80%.  03/01/2019: Right lumpectomy: Grade 3 IDC, 1.4 cm, margins clear, 0/3 lymph nodes negative, triple negative stage IIb  Treatment plan: 1.Adjuvant chemotherapy with dose dense Adriamycin and Cytoxan x4 followed by Taxol weekly x12 2.Followed by radiation therapy  Patient enrolled in SWOG 1714 clinical trial ----------------------------------------------------------------------------------------------------------------------------------------------------- Current treatment: Cycle 3Adriamycin and Cytoxan. I am starting her on a slightly lower dosage because I am worried about toxicities. Echocardiogram August 2020: EF 60 to 65%  Chemo toxicities: 1.Mild nausea 2.Mild fatigue 3.Cytopenias as expected for cycle 1 day 8 We will plan to keep the dosage the same for the next treatment.  Return to clinic in 2 weeks for cycle 4

## 2019-04-26 ENCOUNTER — Inpatient Hospital Stay: Payer: BC Managed Care – PPO

## 2019-04-26 ENCOUNTER — Other Ambulatory Visit: Payer: Self-pay

## 2019-04-26 VITALS — BP 130/72 | HR 69 | Temp 98.2°F | Resp 18

## 2019-04-26 DIAGNOSIS — Z5111 Encounter for antineoplastic chemotherapy: Secondary | ICD-10-CM | POA: Diagnosis not present

## 2019-04-26 DIAGNOSIS — Z171 Estrogen receptor negative status [ER-]: Secondary | ICD-10-CM

## 2019-04-26 DIAGNOSIS — C50211 Malignant neoplasm of upper-inner quadrant of right female breast: Secondary | ICD-10-CM

## 2019-04-26 MED ORDER — PEGFILGRASTIM-JMDB 6 MG/0.6ML ~~LOC~~ SOSY
PREFILLED_SYRINGE | SUBCUTANEOUS | Status: AC
  Filled 2019-04-26: qty 0.6

## 2019-04-26 MED ORDER — PEGFILGRASTIM-JMDB 6 MG/0.6ML ~~LOC~~ SOSY
6.0000 mg | PREFILLED_SYRINGE | Freq: Once | SUBCUTANEOUS | Status: AC
  Administered 2019-04-26: 6 mg via SUBCUTANEOUS

## 2019-04-26 NOTE — Patient Instructions (Signed)

## 2019-05-07 NOTE — Progress Notes (Signed)
Patient Care Team: [name], MD as PCP - General (Family Medicine) Rockwell Germany, RN as Oncology Nurse Navigator Tressie Ellis, Paulette Blanch, RN as Oncology Nurse Navigator Erroll Luna, MD as Consulting Physician (General Surgery) Nicholas Lose, MD as Consulting Physician (Hematology and Oncology) Kyung Rudd, MD as Consulting Physician (Radiation Oncology)  DIAGNOSIS:    ICD-10-CM   1. Microcytic anemia  D50.9 Iron and TIBC    Ferritin  2. Malignant neoplasm of upper-inner quadrant of right breast in female, estrogen receptor negative ([place])  C50.211    Z17.1     SUMMARY OF ONCOLOGIC HISTORY: Oncology History  Malignant neoplasm of upper-inner quadrant of right breast in female, estrogen receptor negative ([place])  01/02/2019 Initial Diagnosis   Routine screening mammogram detected a 1.2cm mass in the right breast at the 12:30 location 7 cm from the nipple, no axillary adenopathy. Biopsy showed IDC, grade 2-3, HER-2 - (1+), ER -, PR -, Ki67 80%.    01/03/2019 Cancer Staging   Staging form: Breast, AJCC 8th Edition - Clinical stage from 01/03/2019: Stage IB (cT1c, cN0, cM0, G3, ER-, PR-, HER2-) - Signed by Nicholas Lose, MD on 01/03/2019   03/01/2019 Surgery   Lumpectomy ([facility]): IDC, grade 3, 1.4cm, clear margins, 3 lymph nodes negative for carcinoma.    03/09/2019 Cancer Staging   Staging form: Breast, AJCC 8th Edition - Pathologic stage from 03/09/2019: Stage IB (pT1c, pN0, cM0, G3, ER-, PR-, HER2-) - Signed by Nicholas Lose, MD on 03/09/2019   03/27/2019 -  Chemotherapy   The patient had DOXOrubicin (ADRIAMYCIN) chemo injection 118 mg, 50 mg/m2 = 118 mg (83.3 % of original dose 60 mg/m2), Intravenous,  Once, 3 of 4 cycles Dose modification: 50 mg/m2 (original dose 60 mg/m2, Cycle 1, Reason: Provider Judgment), 45 mg/m2 (original dose 60 mg/m2, Cycle 3, Reason: Provider Judgment) Administration: 118 mg (03/27/2019), 118 mg (04/10/2019), 106 mg (04/24/2019) palonosetron (ALOXI)  injection 0.25 mg, 0.25 mg, Intravenous,  Once, 3 of 4 cycles Administration: 0.25 mg (03/27/2019), 0.25 mg (04/10/2019), 0.25 mg (04/24/2019) pegfilgrastim-jmdb (FULPHILA) injection 6 mg, 6 mg, Subcutaneous,  Once, 3 of 4 cycles Administration: 6 mg (03/29/2019), 6 mg (04/12/2019) cyclophosphamide (CYTOXAN) 1,180 mg in sodium chloride 0.9 % 250 mL chemo infusion, 500 mg/m2 = 1,180 mg (83.3 % of original dose 600 mg/m2), Intravenous,  Once, 3 of 4 cycles Dose modification: 500 mg/m2 (original dose 600 mg/m2, Cycle 1, Reason: Provider Judgment), 450 mg/m2 (original dose 600 mg/m2, Cycle 3, Reason: Provider Judgment) Administration: 1,180 mg (03/27/2019), 1,180 mg (04/10/2019), 1,060 mg (04/24/2019) PACLitaxel (TAXOL) 192 mg in sodium chloride 0.9 % 250 mL chemo infusion (</= 36m/m2), 80 mg/m2 = 192 mg, Intravenous,  Once, 0 of 12 cycles fosaprepitant (EMEND) 150 mg, dexamethasone (DECADRON) 12 mg in sodium chloride 0.9 % 145 mL IVPB, , Intravenous,  Once, 3 of 4 cycles Administration:  (03/27/2019),  (04/10/2019),  (04/24/2019)  for chemotherapy treatment.      CHIEF COMPLIANT: Cycle4Adriamycin and Cytoxan  INTERVAL HISTORY: SLorana Potter a 63y.o. 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 She is currently on adjuvant chemotherapy with dose dense Adriamycin and Cytoxan.She presents to the clinic todayforcycle4.  She complains of persistent fatigue and mild nausea.  She started feeling better only yesterday.  REVIEW OF SYSTEMS:   Constitutional: Denies fevers, chills or abnormal weight loss Eyes: Denies blurriness of vision Ears, nose, mouth, throat, and face: Denies mucositis or sore throat Respiratory: Denies cough, dyspnea  or wheezes Cardiovascular: Denies palpitation, chest discomfort Gastrointestinal: Denies nausea, heartburn or change in bowel habits Skin: Denies abnormal skin rashes Lymphatics: Denies new lymphadenopathy or  easy bruising Neurological: Denies numbness, tingling or new weaknesses Behavioral/Psych: Mood is stable, no new changes  Extremities: No lower extremity edema Breast: denies any pain or lumps or nodules in either breasts All other systems were reviewed with the patient and are negative.  I have reviewed the past medical history, past surgical history, social history and family history with the patient and they are unchanged from previous note.  ALLERGIES:  is allergic to latex.  MEDICATIONS:  Current Outpatient Medications  Medication Sig Dispense Refill  . dexamethasone (DECADRON) 4 MG tablet Take 1 tablet day after chemo and 1 tablet 2 days after chemo with food 8 tablet 0  . ibuprofen (ADVIL) 800 MG tablet Take 1 tablet (800 mg total) by mouth every 8 (eight) hours as needed. 30 tablet 0  . lidocaine-prilocaine (EMLA) cream Apply to affected area once 30 g 3  . LORazepam (ATIVAN) 0.5 MG tablet Take 1 tablet (0.5 mg total) by mouth at bedtime as needed (Nausea or vomiting). 30 tablet 0  . ondansetron (ZOFRAN) 8 MG tablet Take 1 tablet (8 mg total) by mouth 2 (two) times daily as needed. Start on the third day after chemotherapy. 30 tablet 1  . prochlorperazine (COMPAZINE) 10 MG tablet Take 1 tablet (10 mg total) by mouth every 6 (six) hours as needed (Nausea or vomiting). 30 tablet 1   No current facility-administered medications for this visit.    PHYSICAL EXAMINATION: ECOG PERFORMANCE STATUS: 1 - Symptomatic but completely ambulatory  Vitals:   05/08/19 0849  BP: 133/87  Pulse: 87  Resp: 18  Temp: 98.2 F (36.8 C)  SpO2: 100%   Filed Weights   05/08/19 0849  Weight: 259 lb 4.8 oz (117.6 kg)    GENERAL: alert, no distress and comfortable SKIN: skin color, texture, turgor are normal, no rashes or significant lesions EYES: normal, Conjunctiva are pink and non-injected, sclera clear OROPHARYNX: no exudate, no erythema and lips, buccal mucosa, and tongue normal  NECK:  supple, thyroid normal size, non-tender, without nodularity LYMPH: no palpable lymphadenopathy in the cervical, axillary or inguinal LUNGS: clear to auscultation and percussion with normal breathing effort HEART: regular rate & rhythm and no murmurs and no lower extremity edema ABDOMEN: abdomen soft, non-tender and normal bowel sounds MUSCULOSKELETAL: no cyanosis of digits and no clubbing  NEURO: alert & oriented x 3 with fluent speech, no focal motor/sensory deficits EXTREMITIES: No lower extremity edema  LABORATORY DATA:  I have reviewed the data as listed CMP Latest Ref Rng & Units 04/24/2019 04/10/2019 04/03/2019  Glucose 70 - 99 mg/dL 102(H) 118(H) 120(H)  BUN 8 - 23 mg/dL '12 20 14  ' Creatinine 0.44 - 1.00 mg/dL 0.81 0.96 0.91  Sodium 135 - 145 mmol/L 143 143 139  Potassium 3.5 - 5.1 mmol/L 3.6 3.9 3.7  Chloride 98 - 111 mmol/L 109 111 107  CO2 22 - 32 mmol/L 24 19(L) 24  Calcium 8.9 - 10.3 mg/dL 8.7(L) 8.8(L) 8.6(L)  Total Protein 6.5 - 8.1 g/dL 6.4(L) 6.8 6.8  Total Bilirubin 0.3 - 1.2 mg/dL 0.2(L) 0.2(L) 0.6  Alkaline Phos 38 - 126 U/L 85 87 91  AST 15 - 41 U/L 12(L) 10(L) 8(L)  ALT 0 - 44 U/L '14 12 8    ' Lab Results  Component Value Date   WBC 5.5 05/08/2019   HGB  10.6 (L) 05/08/2019   HCT 32.9 (L) 05/08/2019   MCV 73.3 (L) 05/08/2019   PLT 170 05/08/2019   NEUTROABS 3.8 05/08/2019    ASSESSMENT & PLAN:  Malignant neoplasm of upper-inner quadrant of right breast in female, estrogen receptor negative ([place]) 01/02/2019:Routine screening mammogram detected a 1.2cm mass in the right breast at the 12:30 location 7 cm from the nipple, no axillary adenopathy. Biopsy showed IDC, grade 2-3, HER-2 - (1+), ER -, PR -, Ki67 80%.  03/01/2019: Right lumpectomy: Grade 3 IDC, 1.4 cm, margins clear, 0/3 lymph nodes negative, triple negative stage IIb  Treatment plan: 1.Adjuvant chemotherapy with dose dense Adriamycin and Cytoxan x4 followed by Taxol weekly x12 2.Followed by  radiation therapy  Patient enrolled in SWOG 1714 clinical trial ----------------------------------------------------------------------------------------------------------------------------------------------------- Current treatment: Cycle4Adriamycin and Cytoxan. Echocardiogram August 2020: EF 60 to 65%  Chemo toxicities: 1.Nausea 2.fatigue lasting more days than before 3.Chemotherapy-induced anemia: Because it is a microcytic anemia would like to obtain iron studies and ferritin today.  If necessary we can add IV iron infusion with her Taxol treatments. Dose reduction because of nausea and fatigue  Return to clinic in 2 weeks for cycle 1 Taxol    Orders Placed This Encounter  Procedures  . Iron and TIBC    Standing Status:   Future    Standing Expiration Date:   05/07/2020  . Ferritin    Standing Status:   Future    Standing Expiration Date:   05/07/2020   The patient has a good understanding of the overall plan. she agrees with it. she will call with any problems that may develop before the next visit here.  Nicholas Lose, MD 05/08/2019  Julious Oka Dorshimer, am acting as scribe for Dr. Nicholas Lose.  I have reviewed the above document for accuracy and completeness, and I agree with the above.

## 2019-05-08 ENCOUNTER — Inpatient Hospital Stay (HOSPITAL_BASED_OUTPATIENT_CLINIC_OR_DEPARTMENT_OTHER): Payer: BC Managed Care – PPO | Admitting: Hematology and Oncology

## 2019-05-08 ENCOUNTER — Inpatient Hospital Stay: Payer: BC Managed Care – PPO

## 2019-05-08 ENCOUNTER — Other Ambulatory Visit: Payer: Self-pay

## 2019-05-08 ENCOUNTER — Encounter: Payer: Self-pay | Admitting: *Deleted

## 2019-05-08 VITALS — BP 133/87 | HR 87 | Temp 98.2°F | Resp 18 | Ht 66.0 in | Wt 259.3 lb

## 2019-05-08 DIAGNOSIS — C50211 Malignant neoplasm of upper-inner quadrant of right female breast: Secondary | ICD-10-CM | POA: Diagnosis not present

## 2019-05-08 DIAGNOSIS — Z5111 Encounter for antineoplastic chemotherapy: Secondary | ICD-10-CM | POA: Diagnosis not present

## 2019-05-08 DIAGNOSIS — Z171 Estrogen receptor negative status [ER-]: Secondary | ICD-10-CM

## 2019-05-08 DIAGNOSIS — D509 Iron deficiency anemia, unspecified: Secondary | ICD-10-CM

## 2019-05-08 LAB — CMP (CANCER CENTER ONLY)
ALT: 14 U/L (ref 0–44)
AST: 15 U/L (ref 15–41)
Albumin: 3.7 g/dL (ref 3.5–5.0)
Alkaline Phosphatase: 90 U/L (ref 38–126)
Anion gap: 9 (ref 5–15)
BUN: 8 mg/dL (ref 8–23)
CO2: 26 mmol/L (ref 22–32)
Calcium: 8.3 mg/dL — ABNORMAL LOW (ref 8.9–10.3)
Chloride: 107 mmol/L (ref 98–111)
Creatinine: 0.87 mg/dL (ref 0.44–1.00)
GFR, Est AFR Am: >60 mL/min (ref >60)
GFR, Estimated: >60 mL/min (ref >60)
Glucose, Bld: 125 mg/dL — ABNORMAL HIGH (ref 70–99)
Potassium: 3.3 mmol/L — ABNORMAL LOW (ref 3.5–5.1)
Sodium: 142 mmol/L (ref 135–145)
Total Bilirubin: 0.3 mg/dL (ref 0.3–1.2)
Total Protein: 6.3 g/dL — ABNORMAL LOW (ref 6.5–8.1)

## 2019-05-08 LAB — CBC WITH DIFFERENTIAL (CANCER CENTER ONLY)
Abs Immature Granulocytes: 0.14 10*3/uL — ABNORMAL HIGH (ref 0.00–0.07)
Basophils Absolute: 0 10*3/uL (ref 0.0–0.1)
Basophils Relative: 0 %
Eosinophils Absolute: 0 10*3/uL (ref 0.0–0.5)
Eosinophils Relative: 0 %
HCT: 32.9 % — ABNORMAL LOW (ref 36.0–46.0)
Hemoglobin: 10.6 g/dL — ABNORMAL LOW (ref 12.0–15.0)
Immature Granulocytes: 3 %
Lymphocytes Relative: 17 %
Lymphs Abs: 0.9 10*3/uL (ref 0.7–4.0)
MCH: 23.6 pg — ABNORMAL LOW (ref 26.0–34.0)
MCHC: 32.2 g/dL (ref 30.0–36.0)
MCV: 73.3 fL — ABNORMAL LOW (ref 80.0–100.0)
Monocytes Absolute: 0.6 10*3/uL (ref 0.1–1.0)
Monocytes Relative: 12 %
Neutro Abs: 3.8 10*3/uL (ref 1.7–7.7)
Neutrophils Relative %: 68 %
Platelet Count: 170 10*3/uL (ref 150–400)
RBC: 4.49 MIL/uL (ref 3.87–5.11)
RDW: 18.8 % — ABNORMAL HIGH (ref 11.5–15.5)
WBC Count: 5.5 10*3/uL (ref 4.0–10.5)
nRBC: 0.7 % — ABNORMAL HIGH (ref 0.0–0.2)

## 2019-05-08 MED ORDER — PALONOSETRON HCL INJECTION 0.25 MG/5ML
INTRAVENOUS | Status: AC
  Filled 2019-05-08: qty 5

## 2019-05-08 MED ORDER — SODIUM CHLORIDE 0.9% FLUSH
10.0000 mL | INTRAVENOUS | Status: DC | PRN
  Administered 2019-05-08: 10 mL
  Filled 2019-05-08: qty 10

## 2019-05-08 MED ORDER — PALONOSETRON HCL INJECTION 0.25 MG/5ML
0.2500 mg | Freq: Once | INTRAVENOUS | Status: AC
  Administered 2019-05-08: 0.25 mg via INTRAVENOUS

## 2019-05-08 MED ORDER — DOXORUBICIN HCL CHEMO IV INJECTION 2 MG/ML
45.0000 mg/m2 | Freq: Once | INTRAVENOUS | Status: AC
  Administered 2019-05-08: 106 mg via INTRAVENOUS
  Filled 2019-05-08: qty 53

## 2019-05-08 MED ORDER — SODIUM CHLORIDE 0.9 % IV SOLN
Freq: Once | INTRAVENOUS | Status: AC
  Filled 2019-05-08: qty 5

## 2019-05-08 MED ORDER — HEPARIN SOD (PORK) LOCK FLUSH 100 UNIT/ML IV SOLN
500.0000 [IU] | Freq: Once | INTRAVENOUS | Status: AC | PRN
  Administered 2019-05-08: 500 [IU]
  Filled 2019-05-08: qty 5

## 2019-05-08 MED ORDER — SODIUM CHLORIDE 0.9 % IV SOLN
450.0000 mg/m2 | Freq: Once | INTRAVENOUS | Status: AC
  Administered 2019-05-08: 1060 mg via INTRAVENOUS
  Filled 2019-05-08: qty 53

## 2019-05-08 MED ORDER — SODIUM CHLORIDE 0.9 % IV SOLN
Freq: Once | INTRAVENOUS | Status: AC
  Filled 2019-05-08: qty 250

## 2019-05-08 NOTE — Patient Instructions (Signed)
[facility] Discharge Instructions for Patients Receiving Chemotherapy  Today you received the following chemotherapy agents:  Doxorubicin, Cyclophosphamide  To help prevent nausea and vomiting after your treatment, we encourage you to take your nausea medication as prescribed.   If you develop nausea and vomiting that is not controlled by your nausea medication, call the clinic.   BELOW ARE SYMPTOMS THAT SHOULD BE REPORTED IMMEDIATELY:  *FEVER GREATER THAN 100.5 F  *CHILLS WITH OR WITHOUT FEVER  NAUSEA AND VOMITING THAT IS NOT CONTROLLED WITH YOUR NAUSEA MEDICATION  *UNUSUAL SHORTNESS OF BREATH  *UNUSUAL BRUISING OR BLEEDING  TENDERNESS IN MOUTH AND THROAT WITH OR WITHOUT PRESENCE OF ULCERS  *URINARY PROBLEMS  *BOWEL PROBLEMS  UNUSUAL RASH Items with * indicate a potential emergency and should be followed up as soon as possible.  Feel free to call the clinic should you have any questions or concerns. The clinic phone number is [phone].  Please show the [place] at check-in to the Emergency Department and triage nurse.

## 2019-05-08 NOTE — Progress Notes (Signed)
[id number] Study;  Spoke with patient in infusion room and confirmed she is still interested in participating in the S1714 (Neuropathy) Study.  Patient agreed to meet with research nurse on Thursday 05/10/19 at 12 pm before her injection appointment. Thanked patient for her time and will see her on Thursday.  Foye Spurling, BSN, RN Clinical Research Nurse 05/08/2019 10:43 AM

## 2019-05-08 NOTE — Assessment & Plan Note (Signed)
01/02/2019:Routine screening mammogram detected a 1.2cm mass in the right breast at the 12:30 location 7 cm from the nipple, no axillary adenopathy. Biopsy showed IDC, grade 2-3, HER-2 - (1+), ER -, PR -, Ki67 80%.  03/01/2019: Right lumpectomy: Grade 3 IDC, 1.4 cm, margins clear, 0/3 lymph nodes negative, triple negative stage IIb  Treatment plan: 1.Adjuvant chemotherapy with dose dense Adriamycin and Cytoxan x4 followed by Taxol weekly x12 2.Followed by radiation therapy  Patient enrolled in SWOG 1714 clinical trial ----------------------------------------------------------------------------------------------------------------------------------------------------- Current treatment: Cycle4Adriamycin and Cytoxan. Echocardiogram August 2020: EF 60 to 65%  Chemo toxicities: 1.Nausea 2.fatigue lasting more days than before 3.Chemotherapy-induced anemia Dose reduction because of nausea and fatigue  Return to clinic in 2 weeks for cycle 1 Taxol

## 2019-05-09 ENCOUNTER — Telehealth: Payer: Self-pay | Admitting: Hematology and Oncology

## 2019-05-09 NOTE — Telephone Encounter (Signed)
Patients voicemail is not setup will mail schedule

## 2019-05-10 ENCOUNTER — Other Ambulatory Visit: Payer: Self-pay

## 2019-05-10 ENCOUNTER — Inpatient Hospital Stay: Payer: BC Managed Care – PPO | Admitting: *Deleted

## 2019-05-10 ENCOUNTER — Inpatient Hospital Stay: Payer: BC Managed Care – PPO

## 2019-05-10 VITALS — BP 132/78 | HR 77 | Temp 98.2°F | Resp 18

## 2019-05-10 DIAGNOSIS — C50211 Malignant neoplasm of upper-inner quadrant of right female breast: Secondary | ICD-10-CM

## 2019-05-10 DIAGNOSIS — Z5111 Encounter for antineoplastic chemotherapy: Secondary | ICD-10-CM | POA: Diagnosis not present

## 2019-05-10 DIAGNOSIS — Z171 Estrogen receptor negative status [ER-]: Secondary | ICD-10-CM

## 2019-05-10 MED ORDER — PEGFILGRASTIM-JMDB 6 MG/0.6ML ~~LOC~~ SOSY
6.0000 mg | PREFILLED_SYRINGE | Freq: Once | SUBCUTANEOUS | Status: AC
  Administered 2019-05-10: 6 mg via SUBCUTANEOUS

## 2019-05-10 MED ORDER — PEGFILGRASTIM-JMDB 6 MG/0.6ML ~~LOC~~ SOSY
PREFILLED_SYRINGE | SUBCUTANEOUS | Status: AC
  Filled 2019-05-10: qty 0.6

## 2019-05-10 NOTE — Patient Instructions (Signed)

## 2019-05-10 NOTE — Research (Signed)
[id number], A PROSPECTIVE OBSERVATIONAL COHORT STUDY TO DEVELOP A PREDICTIVE MODEL OF TAXANE-INDUCED PERIPHERAL NEUROPATHY IN CANCER PATIENTS. CONSENT VISIT: Met with patient alone in private exam room for 30 minutes.  Reviewed study consent and hippa forms with patient in their entirety. Explained the purpose of the study along with potential risks and benefits of participation.  Reviewed the study required assessments and timeline for completing these assessments. Informed patient that participation is completely voluntary and she may withdraw consent at any time. Reviewed the hippa form with patient and we discussed what information will be shared with study and what information will not be sent (such as any personally identifying information.) Patient voiced concern about her medical information including genetic results from blood sample being shared. Patient states she is hesitant to have her information "out there" and she verbalized understanding of hippa laws but states she doesn't feel that would stop people from misusing her information.  Patient stated she wants to think about it some more and discuss it with her son at home before enrolling. Informed patient if she does decide to enroll she will need to come in 30 minutes early before her first Taxol treatment to sign consent before any study procedures can be done.  Gave patient my card and asked her to call me if she does want to enroll on this study.  Patient states she will consider it and will let research nurse know if she decides to participate.  Thanked patient for her time and willingness to consider this study. Encouraged her to call if any questions. Patient verbalized understanding.  Foye Spurling, BSN, RN Clinical Research Nurse 05/10/2019 1:05 PM

## 2019-05-12 ENCOUNTER — Encounter: Payer: Self-pay | Admitting: Emergency Medicine

## 2019-05-12 ENCOUNTER — Emergency Department
Admission: EM | Admit: 2019-05-12 | Discharge: 2019-05-12 | Disposition: A | Discharge status: Home / Self Care | Payer: BC Managed Care – PPO | Source: Home / Self Care | Attending: Family Medicine | Admitting: Family Medicine

## 2019-05-12 DIAGNOSIS — R0981 Nasal congestion: Secondary | ICD-10-CM

## 2019-05-12 DIAGNOSIS — K219 Gastro-esophageal reflux disease without esophagitis: Secondary | ICD-10-CM

## 2019-05-12 MED ORDER — OMEPRAZOLE 20 MG PO CPDR: DELAYED_RELEASE_CAPSULE | ORAL | 1 refills | Status: DC

## 2019-05-12 NOTE — ED Provider Notes (Signed)
[facility]    CSN: [id number] Arrival date & time: 05/12/19  1118      History   Chief Complaint Chief Complaint  Patient presents with  . Nasal Congestion    HPI Kathleen Potter is a 63 y.o. female.   Patient complains of recurring cough and mucous in her throat each morning.  The symptoms resolve after she arises.  She also has some mild nasal congestion.  She notes that her taste is "off" but she feels well otherwise.  She denies nausea/vomiting and abdominal pain.  She has had breast cancer and underwent chemotherapy four days ago.  The history is provided by the patient.    Past Medical History:  Diagnosis Date  . Family history of breast cancer   . Family history of prostate cancer   . Hypertension    Pt states her BP has been running high lately.    Patient Active Problem List   Diagnosis Date Noted  . Family history of breast cancer   . Family history of prostate cancer   . Malignant neoplasm of upper-inner quadrant of right breast in female, estrogen receptor negative ([place]) 01/02/2019    Past Surgical History:  Procedure Laterality Date  . BREAST BIOPSY Right   . BREAST LUMPECTOMY WITH RADIOACTIVE SEED AND SENTINEL LYMPH NODE BIOPSY Right 03/01/2019   Procedure: RIGHT BREAST LUMPECTOMY WITH RADIOACTIVE SEED;  Surgeon: Erroll Luna, MD;  Location: [facility];  Service: General;  Laterality: Right;  . PARTIAL HYSTERECTOMY    . PORTACATH PLACEMENT N/A 03/01/2019   Procedure: INSERTION PORT-A-CATH WITH ULTRASOUND;  Surgeon: Erroll Luna, MD;  Location: [facility];  Service: General;  Laterality: N/A;  . SENTINEL NODE BIOPSY Right 03/01/2019   Procedure: Right Sentinel Lymph Node Biopsy;  Surgeon: Erroll Luna, MD;  Location: [facility];  Service: General;  Laterality: Right;    OB History   No obstetric history on file.      Home Medications    Prior to Admission medications   Medication Sig Start Date End Date Taking? Authorizing Provider    dexamethasone (DECADRON) 4 MG tablet Take 1 tablet day after chemo and 1 tablet 2 days after chemo with food 03/09/19   Nicholas Lose, MD  ibuprofen (ADVIL) 800 MG tablet Take 1 tablet (800 mg total) by mouth every 8 (eight) hours as needed. 03/01/19   Cornett, Marcello Moores, MD  lidocaine-prilocaine (EMLA) cream Apply to affected area once 03/09/19   Nicholas Lose, MD  LORazepam (ATIVAN) 0.5 MG tablet Take 1 tablet (0.5 mg total) by mouth at bedtime as needed (Nausea or vomiting). 03/09/19   Nicholas Lose, MD  omeprazole (PRILOSEC) 20 MG capsule Take one cap daily, 30 minutes before eating 05/12/19   Kandra Nicolas, MD  ondansetron (ZOFRAN) 8 MG tablet Take 1 tablet (8 mg total) by mouth 2 (two) times daily as needed. Start on the third day after chemotherapy. 03/09/19   Nicholas Lose, MD  prochlorperazine (COMPAZINE) 10 MG tablet Take 1 tablet (10 mg total) by mouth every 6 (six) hours as needed (Nausea or vomiting). 03/09/19   Nicholas Lose, MD    Family History Family History  Problem Relation Age of Onset  . Breast cancer Sister 55  . Prostate cancer Paternal Uncle        dx. early-mid 79s, not metastatic    Social History Social History   Tobacco Use  . Smoking status: Never Smoker  . Smokeless tobacco: Never Used  Substance Use Topics  .  Alcohol use: Not Currently  . Drug use: Never     Allergies   Latex   Review of Systems Review of Systems ? sore throat + cough No pleuritic pain No wheezing No nasal congestion ? post-nasal drainage No sinus pain/pressure No itchy/red eyes No earache No hemoptysis No SOB No fever/chills No nausea No vomiting No abdominal pain No diarrhea No urinary symptoms No skin rash No fatigue No myalgias No headache   Physical Exam Triage Vital Signs ED Triage Vitals  Enc Vitals Group     BP 05/12/19 1403 (!) 151/79     Pulse Rate 05/12/19 1403 80     Resp --      Temp 05/12/19 1403 98.5 F (36.9 C)     Temp Source 05/12/19  1403 Oral     SpO2 05/12/19 1403 98 %     Weight --      Height --      Head Circumference --      Peak Flow --      Pain Score 05/12/19 1401 0     Pain Loc --      Pain Edu? --      Excl. in [place]? --    No data found.  Updated Vital Signs BP (!) 151/79 (BP Location: Right Arm)   Pulse 80   Temp 98.5 F (36.9 C) (Oral)   SpO2 98%   Visual Acuity Right Eye Distance:   Left Eye Distance:   Bilateral Distance:    Right Eye Near:   Left Eye Near:    Bilateral Near:     Physical Exam Nursing notes and Vital Signs reviewed. Appearance:  Patient appears stated age, and in no acute distress Eyes:  Pupils are equal, round, and reactive to light and accomodation.  Extraocular movement is intact.  Conjunctivae are not inflamed  Ears:  Canals normal.  Tympanic membranes normal.  Nose:  Normal turbinates.  No sinus tenderness.     Pharynx:  Normal Neck:  Supple.  No adenopathy  Lungs:  Clear to auscultation.  Breath sounds are equal.  Moving air well. Heart:  Regular rate and rhythm without murmurs, rubs, or gallops.  Abdomen:  Nontender without masses or hepatosplenomegaly.  Bowel sounds are present.  No CVA or flank tenderness.  Extremities:  No edema.  Skin:  No rash present.   UC Treatments / Results  Labs (all labs ordered are listed, but only abnormal results are displayed) Labs Reviewed  SARS-COV-2 RNA,(COVID-19) QUALITATIVE NAAT    EKG   Radiology No results found.  Procedures Procedures (including critical care time)  Medications Ordered in UC Medications - No data to display  Initial Impression / Assessment and Plan / UC Course  I have reviewed the triage vital signs and the nursing notes.  Pertinent labs & imaging results that were available during my care of the patient were reviewed by me and considered in my medical decision making (see chart for details).    Suspect GERD as source of patient's symptoms.  Because of her change in taste/smell will  send COVID19 test. Followup with Family Doctor if not improved in about 2 weeks.   Final Clinical Impressions(s) / UC Diagnoses   Final diagnoses:  Gastroesophageal reflux disease, unspecified whether esophagitis present  Nasal congestion     Discharge Instructions     Isolate yourself until COVID-19 test result is available.   If your COVID19 test is positive, then you are infected with the novel  coronavirus and could give the virus to others.  Please continue isolation at home for at least 10 days since the start of your symptoms. If you do not have symptoms, please isolate at home for 10 days from the day you were tested. Once you complete your 10 day quarantine, you may return to normal activities as long as you've not had a fever for over 24 hours (without taking fever reducing medicine) and your symptoms are improving. Please continue good preventive care measures, including:  frequent hand-washing, avoid touching your face, cover coughs/sneezes, stay out of crowds and keep a 6 foot distance from others.  Go to the nearest hospital emergency room if fever/cough/breathlessness are severe or illness seems like a threat to life.     ED Prescriptions    Medication Sig Dispense Auth. Provider   omeprazole (PRILOSEC) 20 MG capsule Take one cap daily, 30 minutes before eating 15 capsule Kandra Nicolas, MD        Kandra Nicolas, MD 05/22/19 1019

## 2019-05-12 NOTE — ED Triage Notes (Signed)
Pt states she has had nasal congestion and feels like there is mucous in her throat and she can not clear it. She has taken mucinex with no relief. She is receiving chemo and had her fourth treatment on Tuesday.

## 2019-05-12 NOTE — Discharge Instructions (Addendum)
Isolate yourself until COVID-19 test result is available.   If your COVID19 test is positive, then you are infected with the novel coronavirus and could give the virus to others.  Please continue isolation at home for at least 10 days since the start of your symptoms. If you do not have symptoms, please isolate at home for 10 days from the day you were tested. Once you complete your 10 day quarantine, you may return to normal activities as long as you've not had a fever for over 24 hours (without taking fever reducing medicine) and your symptoms are improving. Please continue good preventive care measures, including:  frequent hand-washing, avoid touching your face, cover coughs/sneezes, stay out of crowds and keep a 6 foot distance from others.  Go to the nearest hospital emergency room if fever/cough/breathlessness are severe or illness seems like a threat to life.  

## 2019-05-14 LAB — SARS-COV-2 RNA,(COVID-19) QUALITATIVE NAAT: SARS CoV2 RNA: NOT DETECTED

## 2019-05-16 ENCOUNTER — Telehealth: Payer: Self-pay | Admitting: *Deleted

## 2019-05-16 NOTE — Telephone Encounter (Signed)
Notified of COVID results.

## 2019-05-21 NOTE — Progress Notes (Signed)
Patient Care Team: [name], MD as PCP - General (Family Medicine) Rockwell Germany, RN as Oncology Nurse Navigator Tressie Ellis, Paulette Blanch, RN as Oncology Nurse Navigator Erroll Luna, MD as Consulting Physician (General Surgery) Nicholas Lose, MD as Consulting Physician (Hematology and Oncology) Kyung Rudd, MD as Consulting Physician (Radiation Oncology)  DIAGNOSIS:    ICD-10-CM   1. Malignant neoplasm of upper-inner quadrant of right breast in female, estrogen receptor negative ([place])  C50.211    Z17.1     SUMMARY OF ONCOLOGIC HISTORY: Oncology History  Malignant neoplasm of upper-inner quadrant of right breast in female, estrogen receptor negative ([place])  01/02/2019 Initial Diagnosis   Routine screening mammogram detected a 1.2cm mass in the right breast at the 12:30 location 7 cm from the nipple, no axillary adenopathy. Biopsy showed IDC, grade 2-3, HER-2 - (1+), ER -, PR -, Ki67 80%.    01/03/2019 Cancer Staging   Staging form: Breast, AJCC 8th Edition - Clinical stage from 01/03/2019: Stage IB (cT1c, cN0, cM0, G3, ER-, PR-, HER2-) - Signed by Nicholas Lose, MD on 01/03/2019   03/01/2019 Surgery   Lumpectomy (Cornett): IDC, grade 3, 1.4cm, clear margins, 3 lymph nodes negative for carcinoma.    03/09/2019 Cancer Staging   Staging form: Breast, AJCC 8th Edition - Pathologic stage from 03/09/2019: Stage IB (pT1c, pN0, cM0, G3, ER-, PR-, HER2-) - Signed by Nicholas Lose, MD on 03/09/2019   03/27/2019 -  Chemotherapy   The patient had DOXOrubicin (ADRIAMYCIN) chemo injection 118 mg, 50 mg/m2 = 118 mg (83.3 % of original dose 60 mg/m2), Intravenous,  Once, 4 of 4 cycles Dose modification: 50 mg/m2 (original dose 60 mg/m2, Cycle 1, Reason: Provider Judgment), 45 mg/m2 (original dose 60 mg/m2, Cycle 3, Reason: Provider Judgment) Administration: 118 mg (03/27/2019), 118 mg (04/10/2019), 106 mg (04/24/2019), 106 mg (05/08/2019) palonosetron (ALOXI) injection 0.25 mg, 0.25 mg, Intravenous,   Once, 4 of 4 cycles Administration: 0.25 mg (03/27/2019), 0.25 mg (04/10/2019), 0.25 mg (04/24/2019), 0.25 mg (05/08/2019) pegfilgrastim-jmdb (FULPHILA) injection 6 mg, 6 mg, Subcutaneous,  Once, 4 of 4 cycles Administration: 6 mg (03/29/2019), 6 mg (04/12/2019), 6 mg (04/26/2019), 6 mg (05/10/2019) cyclophosphamide (CYTOXAN) 1,180 mg in sodium chloride 0.9 % 250 mL chemo infusion, 500 mg/m2 = 1,180 mg (83.3 % of original dose 600 mg/m2), Intravenous,  Once, 4 of 4 cycles Dose modification: 500 mg/m2 (original dose 600 mg/m2, Cycle 1, Reason: Provider Judgment), 450 mg/m2 (original dose 600 mg/m2, Cycle 3, Reason: Provider Judgment) Administration: 1,180 mg (03/27/2019), 1,180 mg (04/10/2019), 1,060 mg (04/24/2019), 1,060 mg (05/08/2019) PACLitaxel (TAXOL) 192 mg in sodium chloride 0.9 % 250 mL chemo infusion (</= 63m/m2), 80 mg/m2 = 192 mg, Intravenous,  Once, 0 of 12 cycles fosaprepitant (EMEND) 150 mg, dexamethasone (DECADRON) 12 mg in sodium chloride 0.9 % 145 mL IVPB, , Intravenous,  Once, 4 of 4 cycles Administration:  (03/27/2019),  (04/10/2019),  (04/24/2019),  (05/08/2019)  for chemotherapy treatment.      CHIEF COMPLIANT: Cycle1 Taxol  INTERVAL HISTORY: Kathleen Sweenyis a 63y.o. 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 She is currently on adjuvant chemotherapy with weekly Taxol after completing 4 cycles of dose dense Adriamycin and Cytoxan.She presents to the clinic todayforcycle1. Her major complaints are related to lack of taste.  She is also fatigued.  Denies any nausea or vomiting.  She is also complaining of drainage through the nose eyes as well as excessive saliva.  She was started  on a PPI but she does not think it is helping her.  REVIEW OF SYSTEMS:   Constitutional: Denies fevers, chills or abnormal weight loss Eyes: Denies blurriness of vision Ears, nose, mouth, throat, and face: Excessive saliva production, nose  and eyes: Clear discharge Respiratory: Denies cough, dyspnea or wheezes Cardiovascular: Denies palpitation, chest discomfort Gastrointestinal: Denies nausea, heartburn or change in bowel habits Skin: Denies abnormal skin rashes Lymphatics: Denies new lymphadenopathy or easy bruising Neurological: Denies numbness, tingling or new weaknesses Behavioral/Psych: Mood is stable, no new changes  Extremities: No lower extremity edema Breast: denies any pain or lumps or nodules in either breasts All other systems were reviewed with the patient and are negative.  I have reviewed the past medical history, past surgical history, social history and family history with the patient and they are unchanged from previous note.  ALLERGIES:  is allergic to latex.  MEDICATIONS:  Current Outpatient Medications  Medication Sig Dispense Refill  . dexamethasone (DECADRON) 4 MG tablet Take 1 tablet day after chemo and 1 tablet 2 days after chemo with food 8 tablet 0  . ibuprofen (ADVIL) 800 MG tablet Take 1 tablet (800 mg total) by mouth every 8 (eight) hours as needed. 30 tablet 0  . lidocaine-prilocaine (EMLA) cream Apply to affected area once 30 g 3  . LORazepam (ATIVAN) 0.5 MG tablet Take 1 tablet (0.5 mg total) by mouth at bedtime as needed (Nausea or vomiting). 30 tablet 0  . omeprazole (PRILOSEC) 20 MG capsule Take one cap daily, 30 minutes before eating 15 capsule 1  . ondansetron (ZOFRAN) 8 MG tablet Take 1 tablet (8 mg total) by mouth 2 (two) times daily as needed. Start on the third day after chemotherapy. 30 tablet 1  . prochlorperazine (COMPAZINE) 10 MG tablet Take 1 tablet (10 mg total) by mouth every 6 (six) hours as needed (Nausea or vomiting). 30 tablet 1   No current facility-administered medications for this visit.    PHYSICAL EXAMINATION: ECOG PERFORMANCE STATUS: 1 - Symptomatic but completely ambulatory  Vitals:   05/22/19 1150  BP: 139/85  Pulse: 88  Resp: 18  Temp: 97.6 F (36.4  C)  SpO2: 99%   Filed Weights   05/22/19 1150  Weight: 255 lb 11.2 oz (116 kg)    GENERAL: alert, no distress and comfortable SKIN: skin color, texture, turgor are normal, no rashes or significant lesions EYES: normal, Conjunctiva are pink and non-injected, sclera clear OROPHARYNX: no exudate, no erythema and lips, buccal mucosa, and tongue normal  NECK: supple, thyroid normal size, non-tender, without nodularity LYMPH: no palpable lymphadenopathy in the cervical, axillary or inguinal LUNGS: clear to auscultation and percussion with normal breathing effort HEART: regular rate & rhythm and no murmurs and no lower extremity edema ABDOMEN: abdomen soft, non-tender and normal bowel sounds MUSCULOSKELETAL: no cyanosis of digits and no clubbing  NEURO: alert & oriented x 3 with fluent speech, no focal motor/sensory deficits EXTREMITIES: No lower extremity edema  LABORATORY DATA:  I have reviewed the data as listed CMP Latest Ref Rng & Units 05/22/2019 05/08/2019 04/24/2019  Glucose 70 - 99 mg/dL 119(H) 125(H) 102(H)  BUN 8 - 23 mg/dL _0 Creatinine 0.44 - 1.00 mg/dL 0.85 0.87 0.81  Sodium 135 - 145 mmol/L 145 142 143  Potassium 3.5 - 5.1 mmol/L 3.3(L) 3.3(L) 3.6  Chloride 98 - 111 mmol/L 112(H) 107 109  CO2 22 - 32 mmol/L _1 Calcium 8.9 - 10.3 mg/dL 8.6(L)  8.3(L) 8.7(L)  Total Protein 6.5 - 8.1 g/dL 6.4(L) 6.3(L) 6.4(L)  Total Bilirubin 0.3 - 1.2 mg/dL 0.3 0.3 0.2(L)  Alkaline Phos 38 - 126 U/L 81 90 85  AST 15 - 41 U/L 14(L) 15 12(L)  ALT 0 - 44 U/L _0 Lab Results  Component Value Date   WBC 3.6 (L) 05/22/2019   HGB 10.2 (L) 05/22/2019   HCT 32.7 (L) 05/22/2019   MCV 75.0 (L) 05/22/2019   PLT 185 05/22/2019   NEUTROABS 2.4 05/22/2019    ASSESSMENT & PLAN:  Malignant neoplasm of upper-inner quadrant of right breast in female, estrogen receptor negative ([place]) 01/02/2019:Routine screening mammogram detected a 1.2cm mass in the right breast at the 12:30  location 7 cm from the nipple, no axillary adenopathy. Biopsy showed IDC, grade 2-3, HER-2 - (1+), ER -, PR -, Ki67 80%.  03/01/2019: Right lumpectomy: Grade 3 IDC, 1.4 cm, margins clear, 0/3 lymph nodes negative, triple negative stage IIb  Treatment plan: 1.Adjuvant chemotherapy with dose dense Adriamycin and Cytoxan x4 followed by Taxol weekly x12 2.Followed by radiation therapy  Patient enrolled in SWOG 1714 clinical trial ----------------------------------------------------------------------------------------------------------------------------------------------------- Current treatment: Completed 4 cycles of dose denseAdriamycin and Cytoxan.  Today is cycle 1 Taxol Echocardiogram August 2020: EF 60 to 65%  Chemo toxicities: 1.Nausea 2.fatigue  3.Chemotherapy-induced anemia: Microcytic but iron studies were normal.  I suspect that she may have beta thalassemia.  Return to clinic in 1 week for toxicity evaluation on Taxol.    No orders of the defined types were placed in this encounter.  The patient has a good understanding of the overall plan. she agrees with it. she will call with any problems that may develop before the next visit here.  Nicholas Lose, MD 05/22/2019  Julious Oka Dorshimer, am acting as scribe for Dr. Nicholas Lose.  I have reviewed the above document for accuracy and completeness, and I agree with the above.

## 2019-05-22 ENCOUNTER — Other Ambulatory Visit: Payer: Self-pay

## 2019-05-22 ENCOUNTER — Inpatient Hospital Stay (HOSPITAL_BASED_OUTPATIENT_CLINIC_OR_DEPARTMENT_OTHER): Payer: BC Managed Care – PPO | Admitting: Hematology and Oncology

## 2019-05-22 ENCOUNTER — Inpatient Hospital Stay: Payer: BC Managed Care – PPO

## 2019-05-22 ENCOUNTER — Encounter: Payer: Self-pay | Admitting: *Deleted

## 2019-05-22 ENCOUNTER — Ambulatory Visit: Payer: BC Managed Care – PPO | Admitting: Hematology and Oncology

## 2019-05-22 ENCOUNTER — Inpatient Hospital Stay (HOSPITAL_BASED_OUTPATIENT_CLINIC_OR_DEPARTMENT_OTHER): Payer: BC Managed Care – PPO | Admitting: Medical

## 2019-05-22 ENCOUNTER — Encounter: Payer: BC Managed Care – PPO | Admitting: *Deleted

## 2019-05-22 ENCOUNTER — Other Ambulatory Visit: Payer: BC Managed Care – PPO

## 2019-05-22 VITALS — BP 131/68 | HR 65 | Resp 18

## 2019-05-22 DIAGNOSIS — C50211 Malignant neoplasm of upper-inner quadrant of right female breast: Secondary | ICD-10-CM

## 2019-05-22 DIAGNOSIS — Z171 Estrogen receptor negative status [ER-]: Secondary | ICD-10-CM | POA: Diagnosis not present

## 2019-05-22 DIAGNOSIS — Z5111 Encounter for antineoplastic chemotherapy: Secondary | ICD-10-CM | POA: Diagnosis not present

## 2019-05-22 DIAGNOSIS — Z95828 Presence of other vascular implants and grafts: Secondary | ICD-10-CM

## 2019-05-22 DIAGNOSIS — D509 Iron deficiency anemia, unspecified: Secondary | ICD-10-CM

## 2019-05-22 LAB — CMP (CANCER CENTER ONLY)
ALT: 14 U/L (ref 0–44)
AST: 14 U/L — ABNORMAL LOW (ref 15–41)
Albumin: 3.7 g/dL (ref 3.5–5.0)
Alkaline Phosphatase: 81 U/L (ref 38–126)
Anion gap: 11 (ref 5–15)
BUN: 11 mg/dL (ref 8–23)
CO2: 22 mmol/L (ref 22–32)
Calcium: 8.6 mg/dL — ABNORMAL LOW (ref 8.9–10.3)
Chloride: 112 mmol/L — ABNORMAL HIGH (ref 98–111)
Creatinine: 0.85 mg/dL (ref 0.44–1.00)
GFR, Est AFR Am: >60 mL/min (ref >60)
GFR, Estimated: >60 mL/min (ref >60)
Glucose, Bld: 119 mg/dL — ABNORMAL HIGH (ref 70–99)
Potassium: 3.3 mmol/L — ABNORMAL LOW (ref 3.5–5.1)
Sodium: 145 mmol/L (ref 135–145)
Total Bilirubin: 0.3 mg/dL (ref 0.3–1.2)
Total Protein: 6.4 g/dL — ABNORMAL LOW (ref 6.5–8.1)

## 2019-05-22 LAB — CBC WITH DIFFERENTIAL (CANCER CENTER ONLY)
Abs Immature Granulocytes: 0.03 10*3/uL (ref 0.00–0.07)
Basophils Absolute: 0 10*3/uL (ref 0.0–0.1)
Basophils Relative: 1 %
Eosinophils Absolute: 0 10*3/uL (ref 0.0–0.5)
Eosinophils Relative: 0 %
HCT: 32.7 % — ABNORMAL LOW (ref 36.0–46.0)
Hemoglobin: 10.2 g/dL — ABNORMAL LOW (ref 12.0–15.0)
Immature Granulocytes: 1 %
Lymphocytes Relative: 20 %
Lymphs Abs: 0.7 10*3/uL (ref 0.7–4.0)
MCH: 23.4 pg — ABNORMAL LOW (ref 26.0–34.0)
MCHC: 31.2 g/dL (ref 30.0–36.0)
MCV: 75 fL — ABNORMAL LOW (ref 80.0–100.0)
Monocytes Absolute: 0.5 10*3/uL (ref 0.1–1.0)
Monocytes Relative: 14 %
Neutro Abs: 2.4 10*3/uL (ref 1.7–7.7)
Neutrophils Relative %: 64 %
Platelet Count: 185 10*3/uL (ref 150–400)
RBC: 4.36 MIL/uL (ref 3.87–5.11)
RDW: 20.5 % — ABNORMAL HIGH (ref 11.5–15.5)
WBC Count: 3.6 10*3/uL — ABNORMAL LOW (ref 4.0–10.5)
nRBC: 0 % (ref 0.0–0.2)

## 2019-05-22 LAB — IRON AND TIBC
Iron: 75 ug/dL (ref 41–142)
Saturation Ratios: 33 % (ref 21–57)
TIBC: 229 ug/dL — ABNORMAL LOW (ref 236–444)
UIBC: 154 ug/dL (ref 120–384)

## 2019-05-22 LAB — FERRITIN: Ferritin: 674 ng/mL — ABNORMAL HIGH (ref 11–307)

## 2019-05-22 MED ORDER — ONDANSETRON HCL 4 MG/2ML IJ SOLN
8.0000 mg | Freq: Once | INTRAMUSCULAR | Status: AC
  Administered 2019-05-22: 8 mg via INTRAVENOUS

## 2019-05-22 MED ORDER — DIPHENHYDRAMINE HCL 50 MG/ML IJ SOLN
50.0000 mg | Freq: Once | INTRAMUSCULAR | Status: AC
  Administered 2019-05-22: 50 mg via INTRAVENOUS

## 2019-05-22 MED ORDER — SODIUM CHLORIDE 0.9% FLUSH
10.0000 mL | INTRAVENOUS | Status: DC | PRN
  Administered 2019-05-22: 10 mL via INTRAVENOUS
  Filled 2019-05-22: qty 10

## 2019-05-22 MED ORDER — SODIUM CHLORIDE 0.9 % IV SOLN
20.0000 mg | Freq: Once | INTRAVENOUS | Status: AC
  Administered 2019-05-22: 20 mg via INTRAVENOUS
  Filled 2019-05-22: qty 20

## 2019-05-22 MED ORDER — SODIUM CHLORIDE 0.9 % IV SOLN
Freq: Once | INTRAVENOUS | Status: AC
  Filled 2019-05-22: qty 250

## 2019-05-22 MED ORDER — SODIUM CHLORIDE 0.9% FLUSH
10.0000 mL | INTRAVENOUS | Status: DC | PRN
  Administered 2019-05-22: 10 mL
  Filled 2019-05-22: qty 10

## 2019-05-22 MED ORDER — HEPARIN SOD (PORK) LOCK FLUSH 100 UNIT/ML IV SOLN
500.0000 [IU] | Freq: Once | INTRAVENOUS | Status: AC | PRN
  Administered 2019-05-22: 500 [IU]
  Filled 2019-05-22: qty 5

## 2019-05-22 MED ORDER — FAMOTIDINE IN NACL 20-0.9 MG/50ML-% IV SOLN
20.0000 mg | Freq: Once | INTRAVENOUS | Status: AC
  Administered 2019-05-22: 20 mg via INTRAVENOUS

## 2019-05-22 MED ORDER — SODIUM CHLORIDE 0.9 % IV SOLN
80.0000 mg/m2 | Freq: Once | INTRAVENOUS | Status: AC
  Administered 2019-05-22: 192 mg via INTRAVENOUS
  Filled 2019-05-22: qty 32

## 2019-05-22 MED ORDER — LORAZEPAM 0.5 MG PO TABS: 0.5000 mg | ORAL_TABLET | Freq: Every evening | ORAL | 0 refills | Status: DC | PRN

## 2019-05-22 MED ORDER — ONDANSETRON HCL 4 MG/2ML IJ SOLN
INTRAMUSCULAR | Status: AC
  Filled 2019-05-22: qty 4

## 2019-05-22 MED ORDER — SODIUM CHLORIDE 0.9 % IV SOLN: 8.0000 mg | Freq: Once | INTRAVENOUS | Status: DC

## 2019-05-22 MED ORDER — FAMOTIDINE IN NACL 20-0.9 MG/50ML-% IV SOLN
INTRAVENOUS | Status: AC
  Filled 2019-05-22: qty 50

## 2019-05-22 MED ORDER — DIPHENHYDRAMINE HCL 50 MG/ML IJ SOLN
INTRAMUSCULAR | Status: AC
  Filled 2019-05-22: qty 1

## 2019-05-22 NOTE — Progress Notes (Signed)
Pt. Tolerated remaining tx without any issues.

## 2019-05-22 NOTE — Progress Notes (Signed)
S1714 Study; Met patient in infusion room and she states she had decided to decline the S1714 study as she did not feel comfortable sharing her data and specimens.  Thanked patient for considering it and introduced the DCP-001 study below.  DCP-001 Use of a Clinical Trial Screening Tool to Address Cancer Health Disparities in the [facility] [facility] Research Program: Spent 10 minutes with patient to review the DCP-001 study and offer her the opportunity to participate. Informed patient that participation is voluntary and involves a one time consent and collection of demographic variables, with the majority of data collected from her medical record. Noted that no patient identifiers are being reported to the study.  The consent and authorization forms were given to patient for her review. She stated she wants to take them home for her children to read. Patient agreed for research nurse to follow up with her on next appointment.  Thanked patient for her time to think about this study.  Cameo Windham, BSN, RN Clinical Research Nurse 05/22/2019 1:51 PM   

## 2019-05-22 NOTE — Progress Notes (Signed)
First time Taxol. 31min into infusion, pt. Became nauseated. She vomited several times. Taxol turned off,Van Tanner PA at bedside to assess. Zofran 8mg  IV given. Vomiting stopped. Taxol restarted after additional 43minutes, per Lucianne Lei.

## 2019-05-22 NOTE — Assessment & Plan Note (Signed)
01/02/2019:Routine screening mammogram detected a 1.2cm mass in the right breast at the 12:30 location 7 cm from the nipple, no axillary adenopathy. Biopsy showed IDC, grade 2-3, HER-2 - (1+), ER -, PR -, Ki67 80%.  03/01/2019: Right lumpectomy: Grade 3 IDC, 1.4 cm, margins clear, 0/3 lymph nodes negative, triple negative stage IIb  Treatment plan: 1.Adjuvant chemotherapy with dose dense Adriamycin and Cytoxan x4 followed by Taxol weekly x12 2.Followed by radiation therapy  Patient enrolled in SWOG 1714 clinical trial ----------------------------------------------------------------------------------------------------------------------------------------------------- Current treatment: Completed 4 cycles of dose denseAdriamycin and Cytoxan.  Today is cycle 1 Taxol Echocardiogram August 2020: EF 60 to 65%  Chemo toxicities: 1.Nausea 2.fatiguelasting more days than before 3.Chemotherapy-induced anemia: Because it is a microcytic anemia, iron studies have been ordered for today.  Return to clinic in 1 week for toxicity evaluation on Taxol.

## 2019-05-22 NOTE — Patient Instructions (Addendum)
Paclitaxel injection What is this medicine? PACLITAXEL (PAK li TAX el) is a chemotherapy drug. It targets fast dividing cells, like cancer cells, and causes these cells to die. This medicine is used to treat ovarian cancer, breast cancer, lung cancer, Kaposi's sarcoma, and other cancers. This medicine may be used for other purposes; ask your health care provider or pharmacist if you have questions. COMMON BRAND NAME(S): Onxol, Taxol What should I tell my health care provider before I take this medicine? They need to know if you have any of these conditions:  history of irregular heartbeat  liver disease  low blood counts, like low white cell, platelet, or red cell counts  lung or breathing disease, like asthma  tingling of the fingers or toes, or other nerve disorder  an unusual or allergic reaction to paclitaxel, alcohol, polyoxyethylated castor oil, other chemotherapy, other medicines, foods, dyes, or preservatives  pregnant or trying to get pregnant  breast-feeding How should I use this medicine? This drug is given as an infusion into a vein. It is administered in a hospital or clinic by a specially trained health care professional. Talk to your pediatrician regarding the use of this medicine in children. Special care may be needed. Overdosage: If you think you have taken too much of this medicine contact a poison control center or emergency room at once. NOTE: This medicine is only for you. Do not share this medicine with others. What if I miss a dose? It is important not to miss your dose. Call your doctor or health care professional if you are unable to keep an appointment. What may interact with this medicine? Do not take this medicine with any of the following medications:  disulfiram  metronidazole This medicine may also interact with the following medications:  antiviral medicines for hepatitis, HIV or AIDS  certain antibiotics like erythromycin and  clarithromycin  certain medicines for fungal infections like ketoconazole and itraconazole  certain medicines for seizures like carbamazepine, phenobarbital, phenytoin  gemfibrozil  nefazodone  rifampin  St. John's wort This list may not describe all possible interactions. Give your health care provider a list of all the medicines, herbs, non-prescription drugs, or dietary supplements you use. Also tell them if you smoke, drink alcohol, or use illegal drugs. Some items may interact with your medicine. What should I watch for while using this medicine? Your condition will be monitored carefully while you are receiving this medicine. You will need important blood work done while you are taking this medicine. This medicine can cause serious allergic reactions. To reduce your risk you will need to take other medicine(s) before treatment with this medicine. If you experience allergic reactions like skin rash, itching or hives, swelling of the face, lips, or tongue, tell your doctor or health care professional right away. In some cases, you may be given additional medicines to help with side effects. Follow all directions for their use. This drug may make you feel generally unwell. This is not uncommon, as chemotherapy can affect healthy cells as well as cancer cells. Report any side effects. Continue your course of treatment even though you feel ill unless your doctor tells you to stop. Call your doctor or health care professional for advice if you get a fever, chills or sore throat, or other symptoms of a cold or flu. Do not treat yourself. This drug decreases your body's ability to fight infections. Try to avoid being around people who are sick. This medicine may increase your risk to bruise   or bleed. Call your doctor or health care professional if you notice any unusual bleeding. Be careful brushing and flossing your teeth or using a toothpick because you may get an infection or bleed more easily.  If you have any dental work done, tell your dentist you are receiving this medicine. Avoid taking products that contain aspirin, acetaminophen, ibuprofen, naproxen, or ketoprofen unless instructed by your doctor. These medicines may hide a fever. Do not become pregnant while taking this medicine. Women should inform their doctor if they wish to become pregnant or think they might be pregnant. There is a potential for serious side effects to an unborn child. Talk to your health care professional or pharmacist for more information. Do not breast-feed an infant while taking this medicine. Men are advised not to father a child while receiving this medicine. This product may contain alcohol. Ask your pharmacist or healthcare provider if this medicine contains alcohol. Be sure to tell all healthcare providers you are taking this medicine. Certain medicines, like metronidazole and disulfiram, can cause an unpleasant reaction when taken with alcohol. The reaction includes flushing, headache, nausea, vomiting, sweating, and increased thirst. The reaction can last from 30 minutes to several hours. What side effects may I notice from receiving this medicine? Side effects that you should report to your doctor or health care professional as soon as possible:  allergic reactions like skin rash, itching or hives, swelling of the face, lips, or tongue  breathing problems  changes in vision  fast, irregular heartbeat  high or low blood pressure  mouth sores  pain, tingling, numbness in the hands or feet  signs of decreased platelets or bleeding - bruising, pinpoint red spots on the skin, black, tarry stools, blood in the urine  signs of decreased red blood cells - unusually weak or tired, feeling faint or lightheaded, falls  signs of infection - fever or chills, cough, sore throat, pain or difficulty passing urine  signs and symptoms of liver injury like dark yellow or brown urine; general ill feeling or  flu-like symptoms; light-colored stools; loss of appetite; nausea; right upper belly pain; unusually weak or tired; yellowing of the eyes or skin  swelling of the ankles, feet, hands  unusually slow heartbeat Side effects that usually do not require medical attention (report to your doctor or health care professional if they continue or are bothersome):  diarrhea  hair loss  loss of appetite  muscle or joint pain  nausea, vomiting  pain, redness, or irritation at site where injected  tiredness This list may not describe all possible side effects. Call your doctor for medical advice about side effects. You may report side effects to FDA at 1-800-FDA-1088. Where should I keep my medicine? This drug is given in a hospital or clinic and will not be stored at home. NOTE: This sheet is a summary. It may not cover all possible information. If you have questions about this medicine, talk to your doctor, pharmacist, or health care provider.  2020 Elsevier/Gold Standard (2017-01-11 13:14:55) [facility] Discharge Instructions for Patients Receiving Chemotherapy  Today you received the following chemotherapy agents Taxol.  To help prevent nausea and vomiting after your treatment, we encourage you to take your nausea medication.   If you develop nausea and vomiting that is not controlled by your nausea medication, call the clinic.   BELOW ARE SYMPTOMS THAT SHOULD BE REPORTED IMMEDIATELY:  *FEVER GREATER THAN 100.5 F  *CHILLS WITH OR WITHOUT FEVER  NAUSEA  AND VOMITING THAT IS NOT CONTROLLED WITH YOUR NAUSEA MEDICATION  *UNUSUAL SHORTNESS OF BREATH  *UNUSUAL BRUISING OR BLEEDING  TENDERNESS IN MOUTH AND THROAT WITH OR WITHOUT PRESENCE OF ULCERS  *URINARY PROBLEMS  *BOWEL PROBLEMS  UNUSUAL RASH Items with * indicate a potential emergency and should be followed up as soon as possible.  Feel free to call the clinic should you have any questions or concerns. The  clinic phone number is [phone].  Please show the [place] at check-in to the Emergency Department and triage nurse.

## 2019-05-23 ENCOUNTER — Telehealth: Payer: Self-pay | Admitting: Hematology and Oncology

## 2019-05-23 NOTE — Telephone Encounter (Signed)
No new orders during check out.

## 2019-05-23 NOTE — Progress Notes (Signed)
Ms. Kathleen Potter was seen in the infusion room today as she was receiving Taxol.  She had been premedicated with Pepcid, Decadron, and Benadryl.  Despite this she developed nausea and vomiting.  She was given Zofran 8 mg IV x1.  Her vital signs returned showing a pulse of 63, respirations 18 blood pressure 120/64 and oxygen saturation 100% on room air.  Her nausea and vomiting abated and she was able to complete the remainder of her infusion without any other issues of concern today.  Sandi Mealy, MHS, PA-C Physician Assistant

## 2019-05-28 NOTE — Progress Notes (Signed)
Patient Care Team: [name], MD as PCP - General (Family Medicine) Rockwell Germany, RN as Oncology Nurse Navigator Tressie Ellis, Paulette Blanch, RN as Oncology Nurse Navigator Erroll Luna, MD as Consulting Physician (General Surgery) Nicholas Lose, MD as Consulting Physician (Hematology and Oncology) Kyung Rudd, MD as Consulting Physician (Radiation Oncology)  DIAGNOSIS:    ICD-10-CM   1. Malignant neoplasm of upper-inner quadrant of right breast in female, estrogen receptor negative ([place])  C50.211    Z17.1     SUMMARY OF ONCOLOGIC HISTORY: Oncology History  Malignant neoplasm of upper-inner quadrant of right breast in female, estrogen receptor negative ([place])  01/02/2019 Initial Diagnosis   Routine screening mammogram detected a 1.2cm mass in the right breast at the 12:30 location 7 cm from the nipple, no axillary adenopathy. Biopsy showed IDC, grade 2-3, HER-2 - (1+), ER -, PR -, Ki67 80%.    01/03/2019 Cancer Staging   Staging form: Breast, AJCC 8th Edition - Clinical stage from 01/03/2019: Stage IB (cT1c, cN0, cM0, G3, ER-, PR-, HER2-) - Signed by Nicholas Lose, MD on 01/03/2019   03/01/2019 Surgery   Lumpectomy ([place]): IDC, grade 3, 1.4cm, clear margins, 3 lymph nodes negative for carcinoma.    03/09/2019 Cancer Staging   Staging form: Breast, AJCC 8th Edition - Pathologic stage from 03/09/2019: Stage IB (pT1c, pN0, cM0, G3, ER-, PR-, HER2-) - Signed by Nicholas Lose, MD on 03/09/2019   03/27/2019 -  Chemotherapy   The patient had DOXOrubicin (ADRIAMYCIN) chemo injection 118 mg, 50 mg/m2 = 118 mg (83.3 % of original dose 60 mg/m2), Intravenous,  Once, 4 of 4 cycles Dose modification: 50 mg/m2 (original dose 60 mg/m2, Cycle 1, Reason: Provider Judgment), 45 mg/m2 (original dose 60 mg/m2, Cycle 3, Reason: Provider Judgment) Administration: 118 mg (03/27/2019), 118 mg (04/10/2019), 106 mg (04/24/2019), 106 mg (05/08/2019) palonosetron (ALOXI) injection 0.25 mg, 0.25 mg, Intravenous,   Once, 4 of 4 cycles Administration: 0.25 mg (03/27/2019), 0.25 mg (04/10/2019), 0.25 mg (04/24/2019), 0.25 mg (05/08/2019) pegfilgrastim-jmdb (FULPHILA) injection 6 mg, 6 mg, Subcutaneous,  Once, 4 of 4 cycles Administration: 6 mg (03/29/2019), 6 mg (04/12/2019), 6 mg (04/26/2019), 6 mg (05/10/2019) cyclophosphamide (CYTOXAN) 1,180 mg in sodium chloride 0.9 % 250 mL chemo infusion, 500 mg/m2 = 1,180 mg (83.3 % of original dose 600 mg/m2), Intravenous,  Once, 4 of 4 cycles Dose modification: 500 mg/m2 (original dose 600 mg/m2, Cycle 1, Reason: Provider Judgment), 450 mg/m2 (original dose 600 mg/m2, Cycle 3, Reason: Provider Judgment) Administration: 1,180 mg (03/27/2019), 1,180 mg (04/10/2019), 1,060 mg (04/24/2019), 1,060 mg (05/08/2019) PACLitaxel (TAXOL) 192 mg in sodium chloride 0.9 % 250 mL chemo infusion (</= 53m/m2), 80 mg/m2 = 192 mg, Intravenous,  Once, 1 of 12 cycles Administration: 192 mg (05/22/2019) fosaprepitant (EMEND) 150 mg, dexamethasone (DECADRON) 12 mg in sodium chloride 0.9 % 145 mL IVPB, , Intravenous,  Once, 4 of 4 cycles Administration:  (03/27/2019),  (04/10/2019),  (04/24/2019),  (05/08/2019)  for chemotherapy treatment.      CHIEF COMPLIANT: Cycle 2 Taxol  INTERVAL HISTORY: Kathleen Potter a 64y.o. 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 She is currently on adjuvant chemotherapy with weekly Taxol after completing 4 cycles of dose dense Adriamycin and Cytoxan.She presents to the clinic todayforcycle2 and a toxicity check.   I have reviewed the past medical history, past surgical history, social history and family history with the patient and they are unchanged from previous note.  ALLERGIES:  is  allergic to latex.  MEDICATIONS:  Current Outpatient Medications  Medication Sig Dispense Refill  . ibuprofen (ADVIL) 800 MG tablet Take 1 tablet (800 mg total) by mouth every 8 (eight) hours as needed. 30 tablet 0   . lidocaine-prilocaine (EMLA) cream Apply to affected area once 30 g 3  . LORazepam (ATIVAN) 0.5 MG tablet Take 1 tablet (0.5 mg total) by mouth at bedtime as needed (Nausea or vomiting). 30 tablet 0  . omeprazole (PRILOSEC) 20 MG capsule Take one cap daily, 30 minutes before eating 15 capsule 1  . ondansetron (ZOFRAN) 8 MG tablet Take 1 tablet (8 mg total) by mouth 2 (two) times daily as needed. Start on the third day after chemotherapy. 30 tablet 1  . prochlorperazine (COMPAZINE) 10 MG tablet Take 1 tablet (10 mg total) by mouth every 6 (six) hours as needed (Nausea or vomiting). 30 tablet 1   No current facility-administered medications for this visit.    PHYSICAL EXAMINATION: ECOG PERFORMANCE STATUS: 1 - Symptomatic but completely ambulatory  Vitals:   05/29/19 0959  BP: 140/82  Pulse: 87  Resp: 18  Temp: (!) 97.5 F (36.4 C)  SpO2: 100%   Filed Weights   05/29/19 0959  Weight: 255 lb 12.8 oz (116 kg)    LABORATORY DATA:  I have reviewed the data as listed CMP Latest Ref Rng & Units 05/22/2019 05/08/2019 04/24/2019  Glucose 70 - 99 mg/dL 119(H) 125(H) 102(H)  BUN 8 - 23 mg/dL 11 8 12   Creatinine 0.44 - 1.00 mg/dL 0.85 0.87 0.81  Sodium 135 - 145 mmol/L 145 142 143  Potassium 3.5 - 5.1 mmol/L 3.3(L) 3.3(L) 3.6  Chloride 98 - 111 mmol/L 112(H) 107 109  CO2 22 - 32 mmol/L 22 26 24   Calcium 8.9 - 10.3 mg/dL 8.6(L) 8.3(L) 8.7(L)  Total Protein 6.5 - 8.1 g/dL 6.4(L) 6.3(L) 6.4(L)  Total Bilirubin 0.3 - 1.2 mg/dL 0.3 0.3 0.2(L)  Alkaline Phos 38 - 126 U/L 81 90 85  AST 15 - 41 U/L 14(L) 15 12(L)  ALT 0 - 44 U/L 14 14 14     Lab Results  Component Value Date   WBC 5.5 05/29/2019   HGB 10.3 (L) 05/29/2019   HCT 32.1 (L) 05/29/2019   MCV 74.7 (L) 05/29/2019   PLT 227 05/29/2019   NEUTROABS 4.2 05/29/2019    ASSESSMENT & PLAN:  Malignant neoplasm of upper-inner quadrant of right breast in female, estrogen receptor negative ([place]) 01/02/2019:Routine screening mammogram  detected a 1.2cm mass in the right breast at the 12:30 location 7 cm from the nipple, no axillary adenopathy. Biopsy showed IDC, grade 2-3, HER-2 - (1+), ER -, PR -, Ki67 80%.  03/01/2019: Right lumpectomy: Grade 3 IDC, 1.4 cm, margins clear, 0/3 lymph nodes negative, triple negative stage IIb  Treatment plan: 1.Adjuvant chemotherapy with dose dense Adriamycin and Cytoxan x4 followed by Taxol weekly x12 2.Followed by radiation therapy  Patient enrolled in SWOG 1714 clinical trial ----------------------------------------------------------------------------------------------------------------------------------------------------- Current treatment: Completed 4 cycles of dose denseAdriamycin and Cytoxan.  Today is cycle 2 Taxol Echocardiogram August 2020: EF 60 to 65%  Chemo toxicities: 1.Nausea: Patient threw up with the last treatment 30 minutes into infusion.  We will give Aloxi prior to the infusion today.  I also decreased the dose of Decadron and Benadryl today. 2.fatigue: Stable 3.Chemotherapy-induced anemia: Microcytic but iron studies were normal.  I suspect that she may have beta thalassemia.  Monitoring her hemoglobin  Return to clinic weekly for Taxol every other  week for follow-up with me.    No orders of the defined types were placed in this encounter.  The patient has a good understanding of the overall plan. she agrees with it. she will call with any problems that may develop before the next visit here.  Total time spent: 30 mins including face to face time and time spent for planning, charting and coordination of care  Nicholas Lose, MD 05/29/2019  I, Cloyde Reams Dorshimer, am acting as scribe for Dr. Nicholas Lose.  I have reviewed the above document for accuracy and completeness, and I agree with the above.

## 2019-05-29 ENCOUNTER — Inpatient Hospital Stay: Payer: BC Managed Care – PPO | Attending: Hematology and Oncology

## 2019-05-29 ENCOUNTER — Encounter: Payer: Self-pay | Admitting: *Deleted

## 2019-05-29 ENCOUNTER — Inpatient Hospital Stay: Payer: BC Managed Care – PPO

## 2019-05-29 ENCOUNTER — Inpatient Hospital Stay (HOSPITAL_BASED_OUTPATIENT_CLINIC_OR_DEPARTMENT_OTHER): Payer: BC Managed Care – PPO | Admitting: Hematology and Oncology

## 2019-05-29 ENCOUNTER — Other Ambulatory Visit: Payer: Self-pay

## 2019-05-29 DIAGNOSIS — Z171 Estrogen receptor negative status [ER-]: Secondary | ICD-10-CM

## 2019-05-29 DIAGNOSIS — Z5111 Encounter for antineoplastic chemotherapy: Secondary | ICD-10-CM | POA: Diagnosis present

## 2019-05-29 DIAGNOSIS — Z95828 Presence of other vascular implants and grafts: Secondary | ICD-10-CM

## 2019-05-29 DIAGNOSIS — C50211 Malignant neoplasm of upper-inner quadrant of right female breast: Secondary | ICD-10-CM

## 2019-05-29 LAB — CMP (CANCER CENTER ONLY)
ALT: 28 U/L (ref 0–44)
AST: 21 U/L (ref 15–41)
Albumin: 3.9 g/dL (ref 3.5–5.0)
Alkaline Phosphatase: 62 U/L (ref 38–126)
Anion gap: 8 (ref 5–15)
BUN: 17 mg/dL (ref 8–23)
CO2: 24 mmol/L (ref 22–32)
Calcium: 8.9 mg/dL (ref 8.9–10.3)
Chloride: 110 mmol/L (ref 98–111)
Creatinine: 0.85 mg/dL (ref 0.44–1.00)
GFR, Est AFR Am: >60 mL/min (ref >60)
GFR, Estimated: >60 mL/min (ref >60)
Glucose, Bld: 133 mg/dL — ABNORMAL HIGH (ref 70–99)
Potassium: 3.3 mmol/L — ABNORMAL LOW (ref 3.5–5.1)
Sodium: 142 mmol/L (ref 135–145)
Total Bilirubin: 0.8 mg/dL (ref 0.3–1.2)
Total Protein: 6.4 g/dL — ABNORMAL LOW (ref 6.5–8.1)

## 2019-05-29 LAB — CBC WITH DIFFERENTIAL (CANCER CENTER ONLY)
Abs Immature Granulocytes: 0.04 10*3/uL (ref 0.00–0.07)
Basophils Absolute: 0 10*3/uL (ref 0.0–0.1)
Basophils Relative: 0 %
Eosinophils Absolute: 0 10*3/uL (ref 0.0–0.5)
Eosinophils Relative: 0 %
HCT: 32.1 % — ABNORMAL LOW (ref 36.0–46.0)
Hemoglobin: 10.3 g/dL — ABNORMAL LOW (ref 12.0–15.0)
Immature Granulocytes: 1 %
Lymphocytes Relative: 15 %
Lymphs Abs: 0.8 10*3/uL (ref 0.7–4.0)
MCH: 24 pg — ABNORMAL LOW (ref 26.0–34.0)
MCHC: 32.1 g/dL (ref 30.0–36.0)
MCV: 74.7 fL — ABNORMAL LOW (ref 80.0–100.0)
Monocytes Absolute: 0.4 10*3/uL (ref 0.1–1.0)
Monocytes Relative: 8 %
Neutro Abs: 4.2 10*3/uL (ref 1.7–7.7)
Neutrophils Relative %: 76 %
Platelet Count: 227 10*3/uL (ref 150–400)
RBC: 4.3 MIL/uL (ref 3.87–5.11)
RDW: 19.8 % — ABNORMAL HIGH (ref 11.5–15.5)
WBC Count: 5.5 10*3/uL (ref 4.0–10.5)
nRBC: 0 % (ref 0.0–0.2)

## 2019-05-29 MED ORDER — SODIUM CHLORIDE 0.9 % IV SOLN: 10.0000 mg | Freq: Once | INTRAVENOUS | Status: DC

## 2019-05-29 MED ORDER — PALONOSETRON HCL INJECTION 0.25 MG/5ML
0.2500 mg | Freq: Once | INTRAVENOUS | Status: AC
  Administered 2019-05-29: 0.25 mg via INTRAVENOUS

## 2019-05-29 MED ORDER — PALONOSETRON HCL INJECTION 0.25 MG/5ML
INTRAVENOUS | Status: AC
  Filled 2019-05-29: qty 5

## 2019-05-29 MED ORDER — SODIUM CHLORIDE 0.9% FLUSH
10.0000 mL | INTRAVENOUS | Status: DC | PRN
  Administered 2019-05-29: 10 mL
  Filled 2019-05-29: qty 10

## 2019-05-29 MED ORDER — DIPHENHYDRAMINE HCL 50 MG/ML IJ SOLN
INTRAMUSCULAR | Status: AC
  Filled 2019-05-29: qty 1

## 2019-05-29 MED ORDER — FAMOTIDINE IN NACL 20-0.9 MG/50ML-% IV SOLN
20.0000 mg | Freq: Once | INTRAVENOUS | Status: AC
  Administered 2019-05-29: 20 mg via INTRAVENOUS

## 2019-05-29 MED ORDER — SODIUM CHLORIDE 0.9% FLUSH
10.0000 mL | INTRAVENOUS | Status: DC | PRN
  Administered 2019-05-29: 10 mL via INTRAVENOUS
  Filled 2019-05-29: qty 10

## 2019-05-29 MED ORDER — FAMOTIDINE IN NACL 20-0.9 MG/50ML-% IV SOLN
INTRAVENOUS | Status: AC
  Filled 2019-05-29: qty 50

## 2019-05-29 MED ORDER — DEXAMETHASONE SODIUM PHOSPHATE 10 MG/ML IJ SOLN
INTRAMUSCULAR | Status: AC
  Filled 2019-05-29: qty 1

## 2019-05-29 MED ORDER — SODIUM CHLORIDE 0.9 % IV SOLN
80.0000 mg/m2 | Freq: Once | INTRAVENOUS | Status: AC
  Administered 2019-05-29: 192 mg via INTRAVENOUS
  Filled 2019-05-29: qty 32

## 2019-05-29 MED ORDER — SODIUM CHLORIDE 0.9 % IV SOLN
Freq: Once | INTRAVENOUS | Status: AC
  Filled 2019-05-29: qty 250

## 2019-05-29 MED ORDER — HEPARIN SOD (PORK) LOCK FLUSH 100 UNIT/ML IV SOLN
500.0000 [IU] | Freq: Once | INTRAVENOUS | Status: AC | PRN
  Administered 2019-05-29: 500 [IU]
  Filled 2019-05-29: qty 5

## 2019-05-29 MED ORDER — DIPHENHYDRAMINE HCL 50 MG/ML IJ SOLN
25.0000 mg | Freq: Once | INTRAMUSCULAR | Status: AC
  Administered 2019-05-29: 25 mg via INTRAVENOUS

## 2019-05-29 MED ORDER — DEXAMETHASONE SODIUM PHOSPHATE 10 MG/ML IJ SOLN
10.0000 mg | Freq: Once | INTRAMUSCULAR | Status: AC
  Administered 2019-05-29: 10 mg via INTRAVENOUS

## 2019-05-29 NOTE — Assessment & Plan Note (Signed)
01/02/2019:Routine screening mammogram detected a 1.2cm mass in the right breast at the 12:30 location 7 cm from the nipple, no axillary adenopathy. Biopsy showed IDC, grade 2-3, HER-2 - (1+), ER -, PR -, Ki67 80%.  03/01/2019: Right lumpectomy: Grade 3 IDC, 1.4 cm, margins clear, 0/3 lymph nodes negative, triple negative stage IIb  Treatment plan: 1.Adjuvant chemotherapy with dose dense Adriamycin and Cytoxan x4 followed by Taxol weekly x12 2.Followed by radiation therapy  Patient enrolled in SWOG 1714 clinical trial ----------------------------------------------------------------------------------------------------------------------------------------------------- Current treatment: Completed 4 cycles of dose denseAdriamycin and Cytoxan.  Today is cycle 2 Taxol Echocardiogram August 2020: EF 60 to 65%  Chemo toxicities: 1.Nausea 2.fatigue  3.Chemotherapy-induced anemia: Microcytic but iron studies were normal.  I suspect that she may have beta thalassemia.  Return to clinic weekly for Taxol every other week for follow-up with me.

## 2019-05-29 NOTE — Patient Instructions (Signed)
Paclitaxel injection What is this medicine? PACLITAXEL (PAK li TAX el) is a chemotherapy drug. It targets fast dividing cells, like cancer cells, and causes these cells to die. This medicine is used to treat ovarian cancer, breast cancer, lung cancer, Kaposi's sarcoma, and other cancers. This medicine may be used for other purposes; ask your health care provider or pharmacist if you have questions. COMMON BRAND NAME(S): Onxol, Taxol What should I tell my health care provider before I take this medicine? They need to know if you have any of these conditions:  history of irregular heartbeat  liver disease  low blood counts, like low white cell, platelet, or red cell counts  lung or breathing disease, like asthma  tingling of the fingers or toes, or other nerve disorder  an unusual or allergic reaction to paclitaxel, alcohol, polyoxyethylated castor oil, other chemotherapy, other medicines, foods, dyes, or preservatives  pregnant or trying to get pregnant  breast-feeding How should I use this medicine? This drug is given as an infusion into a vein. It is administered in a hospital or clinic by a specially trained health care professional. Talk to your pediatrician regarding the use of this medicine in children. Special care may be needed. Overdosage: If you think you have taken too much of this medicine contact a poison control center or emergency room at once. NOTE: This medicine is only for you. Do not share this medicine with others. What if I miss a dose? It is important not to miss your dose. Call your doctor or health care professional if you are unable to keep an appointment. What may interact with this medicine? Do not take this medicine with any of the following medications:  disulfiram  metronidazole This medicine may also interact with the following medications:  antiviral medicines for hepatitis, HIV or AIDS  certain antibiotics like erythromycin and  clarithromycin  certain medicines for fungal infections like ketoconazole and itraconazole  certain medicines for seizures like carbamazepine, phenobarbital, phenytoin  gemfibrozil  nefazodone  rifampin  St. John's wort This list may not describe all possible interactions. Give your health care provider a list of all the medicines, herbs, non-prescription drugs, or dietary supplements you use. Also tell them if you smoke, drink alcohol, or use illegal drugs. Some items may interact with your medicine. What should I watch for while using this medicine? Your condition will be monitored carefully while you are receiving this medicine. You will need important blood work done while you are taking this medicine. This medicine can cause serious allergic reactions. To reduce your risk you will need to take other medicine(s) before treatment with this medicine. If you experience allergic reactions like skin rash, itching or hives, swelling of the face, lips, or tongue, tell your doctor or health care professional right away. In some cases, you may be given additional medicines to help with side effects. Follow all directions for their use. This drug may make you feel generally unwell. This is not uncommon, as chemotherapy can affect healthy cells as well as cancer cells. Report any side effects. Continue your course of treatment even though you feel ill unless your doctor tells you to stop. Call your doctor or health care professional for advice if you get a fever, chills or sore throat, or other symptoms of a cold or flu. Do not treat yourself. This drug decreases your body's ability to fight infections. Try to avoid being around people who are sick. This medicine may increase your risk to bruise   or bleed. Call your doctor or health care professional if you notice any unusual bleeding. Be careful brushing and flossing your teeth or using a toothpick because you may get an infection or bleed more easily.  If you have any dental work done, tell your dentist you are receiving this medicine. Avoid taking products that contain aspirin, acetaminophen, ibuprofen, naproxen, or ketoprofen unless instructed by your doctor. These medicines may hide a fever. Do not become pregnant while taking this medicine. Women should inform their doctor if they wish to become pregnant or think they might be pregnant. There is a potential for serious side effects to an unborn child. Talk to your health care professional or pharmacist for more information. Do not breast-feed an infant while taking this medicine. Men are advised not to father a child while receiving this medicine. This product may contain alcohol. Ask your pharmacist or healthcare provider if this medicine contains alcohol. Be sure to tell all healthcare providers you are taking this medicine. Certain medicines, like metronidazole and disulfiram, can cause an unpleasant reaction when taken with alcohol. The reaction includes flushing, headache, nausea, vomiting, sweating, and increased thirst. The reaction can last from 30 minutes to several hours. What side effects may I notice from receiving this medicine? Side effects that you should report to your doctor or health care professional as soon as possible:  allergic reactions like skin rash, itching or hives, swelling of the face, lips, or tongue  breathing problems  changes in vision  fast, irregular heartbeat  high or low blood pressure  mouth sores  pain, tingling, numbness in the hands or feet  signs of decreased platelets or bleeding - bruising, pinpoint red spots on the skin, black, tarry stools, blood in the urine  signs of decreased red blood cells - unusually weak or tired, feeling faint or lightheaded, falls  signs of infection - fever or chills, cough, sore throat, pain or difficulty passing urine  signs and symptoms of liver injury like dark yellow or brown urine; general ill feeling or  flu-like symptoms; light-colored stools; loss of appetite; nausea; right upper belly pain; unusually weak or tired; yellowing of the eyes or skin  swelling of the ankles, feet, hands  unusually slow heartbeat Side effects that usually do not require medical attention (report to your doctor or health care professional if they continue or are bothersome):  diarrhea  hair loss  loss of appetite  muscle or joint pain  nausea, vomiting  pain, redness, or irritation at site where injected  tiredness This list may not describe all possible side effects. Call your doctor for medical advice about side effects. You may report side effects to FDA at 1-800-FDA-1088. Where should I keep my medicine? This drug is given in a hospital or clinic and will not be stored at home. NOTE: This sheet is a summary. It may not cover all possible information. If you have questions about this medicine, talk to your doctor, pharmacist, or health care provider.  2020 Elsevier/Gold Standard (2017-01-11 13:14:55) [facility] Discharge Instructions for Patients Receiving Chemotherapy  Today you received the following chemotherapy agents Taxol.  To help prevent nausea and vomiting after your treatment, we encourage you to take your nausea medication.   If you develop nausea and vomiting that is not controlled by your nausea medication, call the clinic.   BELOW ARE SYMPTOMS THAT SHOULD BE REPORTED IMMEDIATELY:  *FEVER GREATER THAN 100.5 F  *CHILLS WITH OR WITHOUT FEVER  NAUSEA  AND VOMITING THAT IS NOT CONTROLLED WITH YOUR NAUSEA MEDICATION  *UNUSUAL SHORTNESS OF BREATH  *UNUSUAL BRUISING OR BLEEDING  TENDERNESS IN MOUTH AND THROAT WITH OR WITHOUT PRESENCE OF ULCERS  *URINARY PROBLEMS  *BOWEL PROBLEMS  UNUSUAL RASH Items with * indicate a potential emergency and should be followed up as soon as possible.  Feel free to call the clinic should you have any questions or concerns. The  clinic phone number is [phone].  Please show the [place] at check-in to the Emergency Department and triage nurse.

## 2019-05-29 NOTE — Progress Notes (Signed)
Met with patient briefly in infusion room to follow up on the DCP-001 study.  Patient states she has not had a chance to discuss the study with her family.  She is still thinking about it and she agreed it is ok for research nurse to check back with her next week.  Thanked patient for her time and plan to see her next week.  Foye Spurling, BSN, RN Clinical Research Nurse 05/29/2019 11:56 AM

## 2019-05-29 NOTE — Patient Instructions (Signed)

## 2019-06-04 ENCOUNTER — Encounter: Payer: Self-pay | Admitting: *Deleted

## 2019-06-05 ENCOUNTER — Inpatient Hospital Stay: Payer: BC Managed Care – PPO

## 2019-06-05 ENCOUNTER — Telehealth: Payer: Self-pay | Admitting: Hematology and Oncology

## 2019-06-05 NOTE — Telephone Encounter (Signed)
Scheduled appt per 1/12 sch message - pt aware of appt date and time   

## 2019-06-06 ENCOUNTER — Inpatient Hospital Stay: Payer: BC Managed Care – PPO

## 2019-06-06 ENCOUNTER — Other Ambulatory Visit: Payer: Self-pay

## 2019-06-06 VITALS — BP 153/82 | HR 89 | Temp 98.7°F | Resp 18

## 2019-06-06 DIAGNOSIS — C50211 Malignant neoplasm of upper-inner quadrant of right female breast: Secondary | ICD-10-CM

## 2019-06-06 DIAGNOSIS — Z95828 Presence of other vascular implants and grafts: Secondary | ICD-10-CM

## 2019-06-06 DIAGNOSIS — Z171 Estrogen receptor negative status [ER-]: Secondary | ICD-10-CM

## 2019-06-06 DIAGNOSIS — Z5111 Encounter for antineoplastic chemotherapy: Secondary | ICD-10-CM | POA: Diagnosis not present

## 2019-06-06 LAB — CMP (CANCER CENTER ONLY)
ALT: 18 U/L (ref 0–44)
AST: 14 U/L — ABNORMAL LOW (ref 15–41)
Albumin: 3.7 g/dL (ref 3.5–5.0)
Alkaline Phosphatase: 70 U/L (ref 38–126)
Anion gap: 8 (ref 5–15)
BUN: 13 mg/dL (ref 8–23)
CO2: 23 mmol/L (ref 22–32)
Calcium: 8.3 mg/dL — ABNORMAL LOW (ref 8.9–10.3)
Chloride: 111 mmol/L (ref 98–111)
Creatinine: 0.82 mg/dL (ref 0.44–1.00)
GFR, Est AFR Am: >60 mL/min (ref >60)
GFR, Estimated: >60 mL/min (ref >60)
Glucose, Bld: 101 mg/dL — ABNORMAL HIGH (ref 70–99)
Potassium: 3.6 mmol/L (ref 3.5–5.1)
Sodium: 142 mmol/L (ref 135–145)
Total Bilirubin: 0.3 mg/dL (ref 0.3–1.2)
Total Protein: 6.5 g/dL (ref 6.5–8.1)

## 2019-06-06 LAB — CBC WITH DIFFERENTIAL (CANCER CENTER ONLY)
Abs Immature Granulocytes: 0.02 10*3/uL (ref 0.00–0.07)
Basophils Absolute: 0 10*3/uL (ref 0.0–0.1)
Basophils Relative: 0 %
Eosinophils Absolute: 0 10*3/uL (ref 0.0–0.5)
Eosinophils Relative: 0 %
HCT: 31.2 % — ABNORMAL LOW (ref 36.0–46.0)
Hemoglobin: 9.9 g/dL — ABNORMAL LOW (ref 12.0–15.0)
Immature Granulocytes: 0 %
Lymphocytes Relative: 19 %
Lymphs Abs: 0.9 10*3/uL (ref 0.7–4.0)
MCH: 23.7 pg — ABNORMAL LOW (ref 26.0–34.0)
MCHC: 31.7 g/dL (ref 30.0–36.0)
MCV: 74.8 fL — ABNORMAL LOW (ref 80.0–100.0)
Monocytes Absolute: 0.4 10*3/uL (ref 0.1–1.0)
Monocytes Relative: 9 %
Neutro Abs: 3.2 10*3/uL (ref 1.7–7.7)
Neutrophils Relative %: 72 %
Platelet Count: 222 10*3/uL (ref 150–400)
RBC: 4.17 MIL/uL (ref 3.87–5.11)
RDW: 20 % — ABNORMAL HIGH (ref 11.5–15.5)
WBC Count: 4.5 10*3/uL (ref 4.0–10.5)
nRBC: 0 % (ref 0.0–0.2)

## 2019-06-06 MED ORDER — DEXAMETHASONE SODIUM PHOSPHATE 10 MG/ML IJ SOLN
INTRAMUSCULAR | Status: AC
  Filled 2019-06-06: qty 1

## 2019-06-06 MED ORDER — DIPHENHYDRAMINE HCL 50 MG/ML IJ SOLN
25.0000 mg | Freq: Once | INTRAMUSCULAR | Status: AC
  Administered 2019-06-06: 25 mg via INTRAVENOUS

## 2019-06-06 MED ORDER — DEXAMETHASONE SODIUM PHOSPHATE 10 MG/ML IJ SOLN
10.0000 mg | Freq: Once | INTRAMUSCULAR | Status: AC
  Administered 2019-06-06: 10 mg via INTRAVENOUS

## 2019-06-06 MED ORDER — PALONOSETRON HCL INJECTION 0.25 MG/5ML
0.2500 mg | Freq: Once | INTRAVENOUS | Status: AC
  Administered 2019-06-06: 0.25 mg via INTRAVENOUS

## 2019-06-06 MED ORDER — PALONOSETRON HCL INJECTION 0.25 MG/5ML
INTRAVENOUS | Status: AC
  Filled 2019-06-06: qty 5

## 2019-06-06 MED ORDER — SODIUM CHLORIDE 0.9% FLUSH
10.0000 mL | INTRAVENOUS | Status: DC | PRN
  Administered 2019-06-06: 10 mL
  Filled 2019-06-06: qty 10

## 2019-06-06 MED ORDER — FAMOTIDINE IN NACL 20-0.9 MG/50ML-% IV SOLN
INTRAVENOUS | Status: AC
  Filled 2019-06-06: qty 50

## 2019-06-06 MED ORDER — SODIUM CHLORIDE 0.9 % IV SOLN
Freq: Once | INTRAVENOUS | Status: AC
  Filled 2019-06-06: qty 250

## 2019-06-06 MED ORDER — FAMOTIDINE IN NACL 20-0.9 MG/50ML-% IV SOLN
20.0000 mg | Freq: Once | INTRAVENOUS | Status: AC
  Administered 2019-06-06: 20 mg via INTRAVENOUS

## 2019-06-06 MED ORDER — SODIUM CHLORIDE 0.9% FLUSH
10.0000 mL | Freq: Once | INTRAVENOUS | Status: AC
  Administered 2019-06-06: 10 mL
  Filled 2019-06-06: qty 10

## 2019-06-06 MED ORDER — SODIUM CHLORIDE 0.9 % IV SOLN
80.0000 mg/m2 | Freq: Once | INTRAVENOUS | Status: AC
  Administered 2019-06-06: 192 mg via INTRAVENOUS
  Filled 2019-06-06: qty 32

## 2019-06-06 MED ORDER — DIPHENHYDRAMINE HCL 50 MG/ML IJ SOLN
INTRAMUSCULAR | Status: AC
  Filled 2019-06-06: qty 1

## 2019-06-06 MED ORDER — HEPARIN SOD (PORK) LOCK FLUSH 100 UNIT/ML IV SOLN
500.0000 [IU] | Freq: Once | INTRAVENOUS | Status: AC | PRN
  Administered 2019-06-06: 500 [IU]
  Filled 2019-06-06: qty 5

## 2019-06-06 NOTE — Patient Instructions (Signed)
Paclitaxel injection What is this medicine? PACLITAXEL (PAK li TAX el) is a chemotherapy drug. It targets fast dividing cells, like cancer cells, and causes these cells to die. This medicine is used to treat ovarian cancer, breast cancer, lung cancer, Kaposi's sarcoma, and other cancers. This medicine may be used for other purposes; ask your health care provider or pharmacist if you have questions. COMMON BRAND NAME(S): Onxol, Taxol What should I tell my health care provider before I take this medicine? They need to know if you have any of these conditions:  history of irregular heartbeat  liver disease  low blood counts, like low white cell, platelet, or red cell counts  lung or breathing disease, like asthma  tingling of the fingers or toes, or other nerve disorder  an unusual or allergic reaction to paclitaxel, alcohol, polyoxyethylated castor oil, other chemotherapy, other medicines, foods, dyes, or preservatives  pregnant or trying to get pregnant  breast-feeding How should I use this medicine? This drug is given as an infusion into a vein. It is administered in a hospital or clinic by a specially trained health care professional. Talk to your pediatrician regarding the use of this medicine in children. Special care may be needed. Overdosage: If you think you have taken too much of this medicine contact a poison control center or emergency room at once. NOTE: This medicine is only for you. Do not share this medicine with others. What if I miss a dose? It is important not to miss your dose. Call your doctor or health care professional if you are unable to keep an appointment. What may interact with this medicine? Do not take this medicine with any of the following medications:  disulfiram  metronidazole This medicine may also interact with the following medications:  antiviral medicines for hepatitis, HIV or AIDS  certain antibiotics like erythromycin and  clarithromycin  certain medicines for fungal infections like ketoconazole and itraconazole  certain medicines for seizures like carbamazepine, phenobarbital, phenytoin  gemfibrozil  nefazodone  rifampin  St. John's wort This list may not describe all possible interactions. Give your health care provider a list of all the medicines, herbs, non-prescription drugs, or dietary supplements you use. Also tell them if you smoke, drink alcohol, or use illegal drugs. Some items may interact with your medicine. What should I watch for while using this medicine? Your condition will be monitored carefully while you are receiving this medicine. You will need important blood work done while you are taking this medicine. This medicine can cause serious allergic reactions. To reduce your risk you will need to take other medicine(s) before treatment with this medicine. If you experience allergic reactions like skin rash, itching or hives, swelling of the face, lips, or tongue, tell your doctor or health care professional right away. In some cases, you may be given additional medicines to help with side effects. Follow all directions for their use. This drug may make you feel generally unwell. This is not uncommon, as chemotherapy can affect healthy cells as well as cancer cells. Report any side effects. Continue your course of treatment even though you feel ill unless your doctor tells you to stop. Call your doctor or health care professional for advice if you get a fever, chills or sore throat, or other symptoms of a cold or flu. Do not treat yourself. This drug decreases your body's ability to fight infections. Try to avoid being around people who are sick. This medicine may increase your risk to bruise   or bleed. Call your doctor or health care professional if you notice any unusual bleeding. Be careful brushing and flossing your teeth or using a toothpick because you may get an infection or bleed more easily.  If you have any dental work done, tell your dentist you are receiving this medicine. Avoid taking products that contain aspirin, acetaminophen, ibuprofen, naproxen, or ketoprofen unless instructed by your doctor. These medicines may hide a fever. Do not become pregnant while taking this medicine. Women should inform their doctor if they wish to become pregnant or think they might be pregnant. There is a potential for serious side effects to an unborn child. Talk to your health care professional or pharmacist for more information. Do not breast-feed an infant while taking this medicine. Men are advised not to father a child while receiving this medicine. This product may contain alcohol. Ask your pharmacist or healthcare provider if this medicine contains alcohol. Be sure to tell all healthcare providers you are taking this medicine. Certain medicines, like metronidazole and disulfiram, can cause an unpleasant reaction when taken with alcohol. The reaction includes flushing, headache, nausea, vomiting, sweating, and increased thirst. The reaction can last from 30 minutes to several hours. What side effects may I notice from receiving this medicine? Side effects that you should report to your doctor or health care professional as soon as possible:  allergic reactions like skin rash, itching or hives, swelling of the face, lips, or tongue  breathing problems  changes in vision  fast, irregular heartbeat  high or low blood pressure  mouth sores  pain, tingling, numbness in the hands or feet  signs of decreased platelets or bleeding - bruising, pinpoint red spots on the skin, black, tarry stools, blood in the urine  signs of decreased red blood cells - unusually weak or tired, feeling faint or lightheaded, falls  signs of infection - fever or chills, cough, sore throat, pain or difficulty passing urine  signs and symptoms of liver injury like dark yellow or brown urine; general ill feeling or  flu-like symptoms; light-colored stools; loss of appetite; nausea; right upper belly pain; unusually weak or tired; yellowing of the eyes or skin  swelling of the ankles, feet, hands  unusually slow heartbeat Side effects that usually do not require medical attention (report to your doctor or health care professional if they continue or are bothersome):  diarrhea  hair loss  loss of appetite  muscle or joint pain  nausea, vomiting  pain, redness, or irritation at site where injected  tiredness This list may not describe all possible side effects. Call your doctor for medical advice about side effects. You may report side effects to FDA at 1-800-FDA-1088. Where should I keep my medicine? This drug is given in a hospital or clinic and will not be stored at home. NOTE: This sheet is a summary. It may not cover all possible information. If you have questions about this medicine, talk to your doctor, pharmacist, or health care provider.  2020 Elsevier/Gold Standard (2017-01-11 13:14:55) [facility] Discharge Instructions for Patients Receiving Chemotherapy  Today you received the following chemotherapy agents Taxol.  To help prevent nausea and vomiting after your treatment, we encourage you to take your nausea medication.   If you develop nausea and vomiting that is not controlled by your nausea medication, call the clinic.   BELOW ARE SYMPTOMS THAT SHOULD BE REPORTED IMMEDIATELY:  *FEVER GREATER THAN 100.5 F  *CHILLS WITH OR WITHOUT FEVER  NAUSEA  AND VOMITING THAT IS NOT CONTROLLED WITH YOUR NAUSEA MEDICATION  *UNUSUAL SHORTNESS OF BREATH  *UNUSUAL BRUISING OR BLEEDING  TENDERNESS IN MOUTH AND THROAT WITH OR WITHOUT PRESENCE OF ULCERS  *URINARY PROBLEMS  *BOWEL PROBLEMS  UNUSUAL RASH Items with * indicate a potential emergency and should be followed up as soon as possible.  Feel free to call the clinic should you have any questions or concerns. The  clinic phone number is [phone].  Please show the [place] at check-in to the Emergency Department and triage nurse.

## 2019-06-06 NOTE — Progress Notes (Signed)
Pt. C/O minor discoloration in toes and fingers after Taxol tx.

## 2019-06-11 ENCOUNTER — Encounter: Payer: Self-pay | Admitting: *Deleted

## 2019-06-11 NOTE — Progress Notes (Signed)
Patient Care Team: [name], MD as PCP - General (Family Medicine) Rockwell Germany, RN as Oncology Nurse Navigator Tressie Ellis, Paulette Blanch, RN as Oncology Nurse Navigator Erroll Luna, MD as Consulting Physician (General Surgery) Nicholas Lose, MD as Consulting Physician (Hematology and Oncology) Kyung Rudd, MD as Consulting Physician (Radiation Oncology)  DIAGNOSIS:    ICD-10-CM   1. Malignant neoplasm of upper-inner quadrant of right breast in female, estrogen receptor negative ([place])  C50.211    Z17.1     SUMMARY OF ONCOLOGIC HISTORY: Oncology History  Malignant neoplasm of upper-inner quadrant of right breast in female, estrogen receptor negative ([place])  01/02/2019 Initial Diagnosis   Routine screening mammogram detected a 1.2cm mass in the right breast at the 12:30 location 7 cm from the nipple, no axillary adenopathy. Biopsy showed IDC, grade 2-3, HER-2 - (1+), ER -, PR -, Ki67 80%.    01/03/2019 Cancer Staging   Staging form: Breast, AJCC 8th Edition - Clinical stage from 01/03/2019: Stage IB (cT1c, cN0, cM0, G3, ER-, PR-, HER2-) - Signed by Nicholas Lose, MD on 01/03/2019   03/01/2019 Surgery   Lumpectomy ([facility]): IDC, grade 3, 1.4cm, clear margins, 3 lymph nodes negative for carcinoma.    03/09/2019 Cancer Staging   Staging form: Breast, AJCC 8th Edition - Pathologic stage from 03/09/2019: Stage IB (pT1c, pN0, cM0, G3, ER-, PR-, HER2-) - Signed by Nicholas Lose, MD on 03/09/2019   03/27/2019 -  Chemotherapy   The patient had DOXOrubicin (ADRIAMYCIN) chemo injection 118 mg, 50 mg/m2 = 118 mg (83.3 % of original dose 60 mg/m2), Intravenous,  Once, 4 of 4 cycles Dose modification: 50 mg/m2 (original dose 60 mg/m2, Cycle 1, Reason: Provider Judgment), 45 mg/m2 (original dose 60 mg/m2, Cycle 3, Reason: Provider Judgment) Administration: 118 mg (03/27/2019), 118 mg (04/10/2019), 106 mg (04/24/2019), 106 mg (05/08/2019) palonosetron (ALOXI) injection 0.25 mg, 0.25 mg, Intravenous,   Once, 6 of 15 cycles Administration: 0.25 mg (03/27/2019), 0.25 mg (04/10/2019), 0.25 mg (04/24/2019), 0.25 mg (05/08/2019), 0.25 mg (05/29/2019), 0.25 mg (06/06/2019) pegfilgrastim-jmdb (FULPHILA) injection 6 mg, 6 mg, Subcutaneous,  Once, 4 of 4 cycles Administration: 6 mg (03/29/2019), 6 mg (04/12/2019), 6 mg (04/26/2019), 6 mg (05/10/2019) cyclophosphamide (CYTOXAN) 1,180 mg in sodium chloride 0.9 % 250 mL chemo infusion, 500 mg/m2 = 1,180 mg (83.3 % of original dose 600 mg/m2), Intravenous,  Once, 4 of 4 cycles Dose modification: 500 mg/m2 (original dose 600 mg/m2, Cycle 1, Reason: Provider Judgment), 450 mg/m2 (original dose 600 mg/m2, Cycle 3, Reason: Provider Judgment) Administration: 1,180 mg (03/27/2019), 1,180 mg (04/10/2019), 1,060 mg (04/24/2019), 1,060 mg (05/08/2019) PACLitaxel (TAXOL) 192 mg in sodium chloride 0.9 % 250 mL chemo infusion (</= 70m/m2), 80 mg/m2 = 192 mg, Intravenous,  Once, 3 of 12 cycles Administration: 192 mg (05/22/2019), 192 mg (05/29/2019), 192 mg (06/06/2019) fosaprepitant (EMEND) 150 mg, dexamethasone (DECADRON) 12 mg in sodium chloride 0.9 % 145 mL IVPB, , Intravenous,  Once, 4 of 4 cycles Administration:  (03/27/2019),  (04/10/2019),  (04/24/2019),  (05/08/2019)  for chemotherapy treatment.      CHIEF COMPLIANT: Cycle 4 Taxol  INTERVAL HISTORY: Kathleen Potter a 64y.o. 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 She is currently on adjuvant chemotherapywith weekly Taxol after completing 4 cycles ofdose dense Adriamycin and Cytoxan.She presents to the clinic todayforcycle4 and a toxicity check.  Apart from mild fatigue she has been doing quite well.  There is some discoloration of her hands and feet  and discoloration around the nailbeds.  She denies any nausea or vomiting.  ALLERGIES:  is allergic to latex.  MEDICATIONS:  Current Outpatient Medications  Medication Sig Dispense Refill  . ibuprofen  (ADVIL) 800 MG tablet Take 1 tablet (800 mg total) by mouth every 8 (eight) hours as needed. 30 tablet 0  . lidocaine-prilocaine (EMLA) cream Apply to affected area once 30 g 3  . LORazepam (ATIVAN) 0.5 MG tablet Take 1 tablet (0.5 mg total) by mouth at bedtime as needed (Nausea or vomiting). 30 tablet 0  . omeprazole (PRILOSEC) 20 MG capsule Take one cap daily, 30 minutes before eating 15 capsule 1  . ondansetron (ZOFRAN) 8 MG tablet Take 1 tablet (8 mg total) by mouth 2 (two) times daily as needed. Start on the third day after chemotherapy. 30 tablet 1  . prochlorperazine (COMPAZINE) 10 MG tablet Take 1 tablet (10 mg total) by mouth every 6 (six) hours as needed (Nausea or vomiting). 30 tablet 1   No current facility-administered medications for this visit.    PHYSICAL EXAMINATION: ECOG PERFORMANCE STATUS: 1 - Symptomatic but completely ambulatory  Vitals:   06/12/19 0832  BP: (!) 151/82  Pulse: 95  Resp: 18  Temp: 97.9 F (36.6 C)  SpO2: 99%   Filed Weights   06/12/19 0832  Weight: 254 lb 14.4 oz (115.6 kg)    LABORATORY DATA:  I have reviewed the data as listed CMP Latest Ref Rng & Units 06/06/2019 05/29/2019 05/22/2019  Glucose 70 - 99 mg/dL 101(H) 133(H) 119(H)  BUN 8 - 23 mg/dL '13 17 11  ' Creatinine 0.44 - 1.00 mg/dL 0.82 0.85 0.85  Sodium 135 - 145 mmol/L 142 142 145  Potassium 3.5 - 5.1 mmol/L 3.6 3.3(L) 3.3(L)  Chloride 98 - 111 mmol/L 111 110 112(H)  CO2 22 - 32 mmol/L '23 24 22  ' Calcium 8.9 - 10.3 mg/dL 8.3(L) 8.9 8.6(L)  Total Protein 6.5 - 8.1 g/dL 6.5 6.4(L) 6.4(L)  Total Bilirubin 0.3 - 1.2 mg/dL 0.3 0.8 0.3  Alkaline Phos 38 - 126 U/L 70 62 81  AST 15 - 41 U/L 14(L) 21 14(L)  ALT 0 - 44 U/L '18 28 14    ' Lab Results  Component Value Date   WBC 3.8 (L) 06/12/2019   HGB 9.8 (L) 06/12/2019   HCT 31.5 (L) 06/12/2019   MCV 76.3 (L) 06/12/2019   PLT 203 06/12/2019   NEUTROABS 2.7 06/12/2019    ASSESSMENT & PLAN:  Malignant neoplasm of upper-inner quadrant  of right breast in female, estrogen receptor negative ([place]) 01/02/2019:Routine screening mammogram detected a 1.2cm mass in the right breast at the 12:30 location 7 cm from the nipple, no axillary adenopathy. Biopsy showed IDC, grade 2-3, HER-2 - (1+), ER -, PR -, Ki67 80%.  03/01/2019: Right lumpectomy: Grade 3 IDC, 1.4 cm, margins clear, 0/3 lymph nodes negative, triple negative stage IIb  Treatment plan: 1.Adjuvant chemotherapy with dose dense Adriamycin and Cytoxan x4 followed by Taxol weekly x12 2.Followed by radiation therapy  Patient enrolled in SWOG 1714 clinical trial ----------------------------------------------------------------------------------------------------------------------------------------------------- Current treatment:Completed 4 cycles of dose denseAdriamycin and Cytoxan.Today is cycle 4 Taxol Echocardiogram August 2020: EF 60 to 65%  Chemo toxicities: 1.Nausea: Giving Aloxi with chemo 2.fatigue: Mild to moderate 3.Chemotherapy-induced anemia:Microcytic but iron studies were normal. I suspect that she may have beta thalassemia.    Today's hemoglobin is 9.8 monitoring 4.  Slight decline in WBC count: ANC is still fine at 2.7.  Continuing the same dosage.  I discussed with her that if her white count continues to come down we may have to reduce the dosage down the line.  Return to clinic weekly for Taxol every other week for follow-up with me.    No orders of the defined types were placed in this encounter.  The patient has a good understanding of the overall plan. she agrees with it. she will call with any problems that may develop before the next visit here.  Total time spent: 30 mins including face to face time and time spent for planning, charting and coordination of care  Nicholas Lose, MD 06/12/2019  I, Kathleen Potter, am acting as scribe for Dr. Nicholas Lose.  I have reviewed the above documentation for accuracy and completeness, and  I agree with the above.

## 2019-06-12 ENCOUNTER — Inpatient Hospital Stay: Payer: BC Managed Care – PPO

## 2019-06-12 ENCOUNTER — Encounter: Payer: Self-pay | Admitting: *Deleted

## 2019-06-12 ENCOUNTER — Inpatient Hospital Stay (HOSPITAL_BASED_OUTPATIENT_CLINIC_OR_DEPARTMENT_OTHER): Payer: BC Managed Care – PPO | Admitting: Hematology and Oncology

## 2019-06-12 ENCOUNTER — Other Ambulatory Visit: Payer: Self-pay

## 2019-06-12 DIAGNOSIS — Z171 Estrogen receptor negative status [ER-]: Secondary | ICD-10-CM | POA: Diagnosis not present

## 2019-06-12 DIAGNOSIS — C50211 Malignant neoplasm of upper-inner quadrant of right female breast: Secondary | ICD-10-CM

## 2019-06-12 DIAGNOSIS — Z5111 Encounter for antineoplastic chemotherapy: Secondary | ICD-10-CM | POA: Diagnosis not present

## 2019-06-12 DIAGNOSIS — Z95828 Presence of other vascular implants and grafts: Secondary | ICD-10-CM

## 2019-06-12 LAB — CBC WITH DIFFERENTIAL (CANCER CENTER ONLY)
Abs Immature Granulocytes: 0.04 K/uL (ref 0.00–0.07)
Basophils Absolute: 0 K/uL (ref 0.0–0.1)
Basophils Relative: 1 %
Eosinophils Absolute: 0 K/uL (ref 0.0–0.5)
Eosinophils Relative: 1 %
HCT: 31.5 % — ABNORMAL LOW (ref 36.0–46.0)
Hemoglobin: 9.8 g/dL — ABNORMAL LOW (ref 12.0–15.0)
Immature Granulocytes: 1 %
Lymphocytes Relative: 19 %
Lymphs Abs: 0.7 K/uL (ref 0.7–4.0)
MCH: 23.7 pg — ABNORMAL LOW (ref 26.0–34.0)
MCHC: 31.1 g/dL (ref 30.0–36.0)
MCV: 76.3 fL — ABNORMAL LOW (ref 80.0–100.0)
Monocytes Absolute: 0.2 K/uL (ref 0.1–1.0)
Monocytes Relative: 6 %
Neutro Abs: 2.7 K/uL (ref 1.7–7.7)
Neutrophils Relative %: 72 %
Platelet Count: 203 K/uL (ref 150–400)
RBC: 4.13 MIL/uL (ref 3.87–5.11)
RDW: 19.5 % — ABNORMAL HIGH (ref 11.5–15.5)
WBC Count: 3.8 K/uL — ABNORMAL LOW (ref 4.0–10.5)
nRBC: 0.5 % — ABNORMAL HIGH (ref 0.0–0.2)

## 2019-06-12 LAB — CMP (CANCER CENTER ONLY)
ALT: 18 U/L (ref 0–44)
AST: 14 U/L — ABNORMAL LOW (ref 15–41)
Albumin: 3.6 g/dL (ref 3.5–5.0)
Alkaline Phosphatase: 64 U/L (ref 38–126)
Anion gap: 8 (ref 5–15)
BUN: 12 mg/dL (ref 8–23)
CO2: 23 mmol/L (ref 22–32)
Calcium: 8.3 mg/dL — ABNORMAL LOW (ref 8.9–10.3)
Chloride: 112 mmol/L — ABNORMAL HIGH (ref 98–111)
Creatinine: 0.81 mg/dL (ref 0.44–1.00)
GFR, Est AFR Am: >60 mL/min (ref >60)
GFR, Estimated: >60 mL/min (ref >60)
Glucose, Bld: 123 mg/dL — ABNORMAL HIGH (ref 70–99)
Potassium: 3.7 mmol/L (ref 3.5–5.1)
Sodium: 143 mmol/L (ref 135–145)
Total Bilirubin: 0.3 mg/dL (ref 0.3–1.2)
Total Protein: 6.3 g/dL — ABNORMAL LOW (ref 6.5–8.1)

## 2019-06-12 MED ORDER — DEXAMETHASONE SODIUM PHOSPHATE 10 MG/ML IJ SOLN
INTRAMUSCULAR | Status: AC
  Filled 2019-06-12: qty 1

## 2019-06-12 MED ORDER — SODIUM CHLORIDE 0.9% FLUSH
10.0000 mL | Freq: Once | INTRAVENOUS | Status: AC
  Administered 2019-06-12: 10 mL
  Filled 2019-06-12: qty 10

## 2019-06-12 MED ORDER — SODIUM CHLORIDE 0.9 % IV SOLN
80.0000 mg/m2 | Freq: Once | INTRAVENOUS | Status: AC
  Administered 2019-06-12: 192 mg via INTRAVENOUS
  Filled 2019-06-12: qty 32

## 2019-06-12 MED ORDER — SODIUM CHLORIDE 0.9% FLUSH
10.0000 mL | INTRAVENOUS | Status: DC | PRN
  Administered 2019-06-12: 10 mL
  Filled 2019-06-12: qty 10

## 2019-06-12 MED ORDER — PALONOSETRON HCL INJECTION 0.25 MG/5ML
0.2500 mg | Freq: Once | INTRAVENOUS | Status: AC
  Administered 2019-06-12: 0.25 mg via INTRAVENOUS

## 2019-06-12 MED ORDER — DEXAMETHASONE SODIUM PHOSPHATE 10 MG/ML IJ SOLN
10.0000 mg | Freq: Once | INTRAMUSCULAR | Status: AC
  Administered 2019-06-12: 10 mg via INTRAVENOUS

## 2019-06-12 MED ORDER — FAMOTIDINE IN NACL 20-0.9 MG/50ML-% IV SOLN
INTRAVENOUS | Status: AC
  Filled 2019-06-12: qty 50

## 2019-06-12 MED ORDER — FAMOTIDINE IN NACL 20-0.9 MG/50ML-% IV SOLN
20.0000 mg | Freq: Once | INTRAVENOUS | Status: AC
  Administered 2019-06-12: 20 mg via INTRAVENOUS

## 2019-06-12 MED ORDER — PALONOSETRON HCL INJECTION 0.25 MG/5ML
INTRAVENOUS | Status: AC
  Filled 2019-06-12: qty 5

## 2019-06-12 MED ORDER — DIPHENHYDRAMINE HCL 50 MG/ML IJ SOLN
INTRAMUSCULAR | Status: AC
  Filled 2019-06-12: qty 1

## 2019-06-12 MED ORDER — HEPARIN SOD (PORK) LOCK FLUSH 100 UNIT/ML IV SOLN
500.0000 [IU] | Freq: Once | INTRAVENOUS | Status: AC | PRN
  Administered 2019-06-12: 500 [IU]
  Filled 2019-06-12: qty 5

## 2019-06-12 MED ORDER — SODIUM CHLORIDE 0.9 % IV SOLN
Freq: Once | INTRAVENOUS | Status: AC
  Filled 2019-06-12: qty 250

## 2019-06-12 MED ORDER — DIPHENHYDRAMINE HCL 50 MG/ML IJ SOLN
25.0000 mg | Freq: Once | INTRAMUSCULAR | Status: AC
  Administered 2019-06-12: 25 mg via INTRAVENOUS

## 2019-06-12 NOTE — Patient Instructions (Signed)
[facility] Discharge Instructions for Patients Receiving Chemotherapy  Today you received the following chemotherapy agents:  Taxol.  To help prevent nausea and vomiting after your treatment, we encourage you to take your nausea medication as directed.   If you develop nausea and vomiting that is not controlled by your nausea medication, call the clinic.   BELOW ARE SYMPTOMS THAT SHOULD BE REPORTED IMMEDIATELY:  *FEVER GREATER THAN 100.5 F  *CHILLS WITH OR WITHOUT FEVER  NAUSEA AND VOMITING THAT IS NOT CONTROLLED WITH YOUR NAUSEA MEDICATION  *UNUSUAL SHORTNESS OF BREATH  *UNUSUAL BRUISING OR BLEEDING  TENDERNESS IN MOUTH AND THROAT WITH OR WITHOUT PRESENCE OF ULCERS  *URINARY PROBLEMS  *BOWEL PROBLEMS  UNUSUAL RASH Items with * indicate a potential emergency and should be followed up as soon as possible.  Feel free to call the clinic should you have any questions or concerns. The clinic phone number is [phone].  Please show the CHEMO ALERT CARD at check-in to the Emergency Department and triage nurse.   

## 2019-06-12 NOTE — Progress Notes (Signed)
DCP-001 Use of a Clinical Trial Screening Tool to Address Cancer Health Disparities in the [facility]: Met with patient in infusion room to follow up on the above study.  Patient declined participation stating does not feel comfortable sharing her information. Thanked patient for her time and for considering this study.  Foye Spurling, BSN, RN Clinical Research Nurse 06/12/2019 11:32 AM

## 2019-06-12 NOTE — Assessment & Plan Note (Signed)
01/02/2019:Routine screening mammogram detected a 1.2cm mass in the right breast at the 12:30 location 7 cm from the nipple, no axillary adenopathy. Biopsy showed IDC, grade 2-3, HER-2 - (1+), ER -, PR -, Ki67 80%.  03/01/2019: Right lumpectomy: Grade 3 IDC, 1.4 cm, margins clear, 0/3 lymph nodes negative, triple negative stage IIb  Treatment plan: 1.Adjuvant chemotherapy with dose dense Adriamycin and Cytoxan x4 followed by Taxol weekly x12 2.Followed by radiation therapy  Patient enrolled in SWOG 1714 clinical trial ----------------------------------------------------------------------------------------------------------------------------------------------------- Current treatment:Completed 4 cycles of dose denseAdriamycin and Cytoxan.Today is cycle 4 Taxol Echocardiogram August 2020: EF 60 to 65%  Chemo toxicities: 1.Nausea: Giving Aloxi with chemo 2.fatigue: Mild to moderate 3.Chemotherapy-induced anemia:Microcytic but iron studies were normal. I suspect that she may have beta thalassemia.    Monitoring  Return to clinic weekly for Taxol every other week for follow-up with me.

## 2019-06-19 ENCOUNTER — Inpatient Hospital Stay: Payer: BC Managed Care – PPO

## 2019-06-19 ENCOUNTER — Other Ambulatory Visit: Payer: Self-pay

## 2019-06-19 VITALS — BP 137/77 | HR 91 | Temp 97.8°F | Resp 20 | Wt 255.4 lb

## 2019-06-19 DIAGNOSIS — Z171 Estrogen receptor negative status [ER-]: Secondary | ICD-10-CM

## 2019-06-19 DIAGNOSIS — Z5111 Encounter for antineoplastic chemotherapy: Secondary | ICD-10-CM | POA: Diagnosis not present

## 2019-06-19 DIAGNOSIS — C50211 Malignant neoplasm of upper-inner quadrant of right female breast: Secondary | ICD-10-CM

## 2019-06-19 DIAGNOSIS — Z95828 Presence of other vascular implants and grafts: Secondary | ICD-10-CM

## 2019-06-19 LAB — CBC WITH DIFFERENTIAL (CANCER CENTER ONLY)
Abs Immature Granulocytes: 0.01 10*3/uL (ref 0.00–0.07)
Basophils Absolute: 0 10*3/uL (ref 0.0–0.1)
Basophils Relative: 1 %
Eosinophils Absolute: 0.1 10*3/uL (ref 0.0–0.5)
Eosinophils Relative: 1 %
HCT: 30.2 % — ABNORMAL LOW (ref 36.0–46.0)
Hemoglobin: 9.6 g/dL — ABNORMAL LOW (ref 12.0–15.0)
Immature Granulocytes: 0 %
Lymphocytes Relative: 17 %
Lymphs Abs: 0.7 10*3/uL (ref 0.7–4.0)
MCH: 23.9 pg — ABNORMAL LOW (ref 26.0–34.0)
MCHC: 31.8 g/dL (ref 30.0–36.0)
MCV: 75.1 fL — ABNORMAL LOW (ref 80.0–100.0)
Monocytes Absolute: 0.2 10*3/uL (ref 0.1–1.0)
Monocytes Relative: 6 %
Neutro Abs: 3.1 10*3/uL (ref 1.7–7.7)
Neutrophils Relative %: 75 %
Platelet Count: 181 10*3/uL (ref 150–400)
RBC: 4.02 MIL/uL (ref 3.87–5.11)
RDW: 19.9 % — ABNORMAL HIGH (ref 11.5–15.5)
WBC Count: 4.1 10*3/uL (ref 4.0–10.5)
nRBC: 0 % (ref 0.0–0.2)

## 2019-06-19 LAB — CMP (CANCER CENTER ONLY)
ALT: 17 U/L (ref 0–44)
AST: 15 U/L (ref 15–41)
Albumin: 3.6 g/dL (ref 3.5–5.0)
Alkaline Phosphatase: 65 U/L (ref 38–126)
Anion gap: 9 (ref 5–15)
BUN: 10 mg/dL (ref 8–23)
CO2: 23 mmol/L (ref 22–32)
Calcium: 8.4 mg/dL — ABNORMAL LOW (ref 8.9–10.3)
Chloride: 109 mmol/L (ref 98–111)
Creatinine: 0.82 mg/dL (ref 0.44–1.00)
GFR, Est AFR Am: >60 mL/min (ref >60)
GFR, Estimated: >60 mL/min (ref >60)
Glucose, Bld: 118 mg/dL — ABNORMAL HIGH (ref 70–99)
Potassium: 3.5 mmol/L (ref 3.5–5.1)
Sodium: 141 mmol/L (ref 135–145)
Total Bilirubin: 0.4 mg/dL (ref 0.3–1.2)
Total Protein: 6.3 g/dL — ABNORMAL LOW (ref 6.5–8.1)

## 2019-06-19 MED ORDER — DIPHENHYDRAMINE HCL 50 MG/ML IJ SOLN
INTRAMUSCULAR | Status: AC
  Filled 2019-06-19: qty 1

## 2019-06-19 MED ORDER — DIPHENHYDRAMINE HCL 50 MG/ML IJ SOLN
25.0000 mg | Freq: Once | INTRAMUSCULAR | Status: AC
  Administered 2019-06-19: 25 mg via INTRAVENOUS

## 2019-06-19 MED ORDER — FAMOTIDINE IN NACL 20-0.9 MG/50ML-% IV SOLN
INTRAVENOUS | Status: AC
  Filled 2019-06-19: qty 50

## 2019-06-19 MED ORDER — PALONOSETRON HCL INJECTION 0.25 MG/5ML
INTRAVENOUS | Status: AC
  Filled 2019-06-19: qty 5

## 2019-06-19 MED ORDER — LIDOCAINE-PRILOCAINE 2.5-2.5 % EX CREA: TOPICAL_CREAM | CUTANEOUS | 3 refills | Status: DC

## 2019-06-19 MED ORDER — SODIUM CHLORIDE 0.9 % IV SOLN
80.0000 mg/m2 | Freq: Once | INTRAVENOUS | Status: AC
  Administered 2019-06-19: 192 mg via INTRAVENOUS
  Filled 2019-06-19: qty 32

## 2019-06-19 MED ORDER — FAMOTIDINE IN NACL 20-0.9 MG/50ML-% IV SOLN
20.0000 mg | Freq: Once | INTRAVENOUS | Status: AC
  Administered 2019-06-19: 20 mg via INTRAVENOUS

## 2019-06-19 MED ORDER — DEXAMETHASONE SODIUM PHOSPHATE 10 MG/ML IJ SOLN
10.0000 mg | Freq: Once | INTRAMUSCULAR | Status: AC
  Administered 2019-06-19: 10 mg via INTRAVENOUS

## 2019-06-19 MED ORDER — HEPARIN SOD (PORK) LOCK FLUSH 100 UNIT/ML IV SOLN
500.0000 [IU] | Freq: Once | INTRAVENOUS | Status: AC | PRN
  Administered 2019-06-19: 500 [IU]
  Filled 2019-06-19: qty 5

## 2019-06-19 MED ORDER — PALONOSETRON HCL INJECTION 0.25 MG/5ML
0.2500 mg | Freq: Once | INTRAVENOUS | Status: AC
  Administered 2019-06-19: 0.25 mg via INTRAVENOUS

## 2019-06-19 MED ORDER — DEXAMETHASONE SODIUM PHOSPHATE 10 MG/ML IJ SOLN
INTRAMUSCULAR | Status: AC
  Filled 2019-06-19: qty 1

## 2019-06-19 MED ORDER — SODIUM CHLORIDE 0.9% FLUSH
10.0000 mL | Freq: Once | INTRAVENOUS | Status: AC
  Administered 2019-06-19: 10 mL
  Filled 2019-06-19: qty 10

## 2019-06-19 MED ORDER — SODIUM CHLORIDE 0.9% FLUSH
10.0000 mL | INTRAVENOUS | Status: DC | PRN
  Administered 2019-06-19: 10 mL
  Filled 2019-06-19: qty 10

## 2019-06-19 MED ORDER — SODIUM CHLORIDE 0.9 % IV SOLN
Freq: Once | INTRAVENOUS | Status: AC
  Filled 2019-06-19: qty 250

## 2019-06-19 NOTE — Patient Instructions (Signed)
[facility] Discharge Instructions for Patients Receiving Chemotherapy  Today you received the following chemotherapy agents:  Taxol.  To help prevent nausea and vomiting after your treatment, we encourage you to take your nausea medication as directed.   If you develop nausea and vomiting that is not controlled by your nausea medication, call the clinic.   BELOW ARE SYMPTOMS THAT SHOULD BE REPORTED IMMEDIATELY:  *FEVER GREATER THAN 100.5 F  *CHILLS WITH OR WITHOUT FEVER  NAUSEA AND VOMITING THAT IS NOT CONTROLLED WITH YOUR NAUSEA MEDICATION  *UNUSUAL SHORTNESS OF BREATH  *UNUSUAL BRUISING OR BLEEDING  TENDERNESS IN MOUTH AND THROAT WITH OR WITHOUT PRESENCE OF ULCERS  *URINARY PROBLEMS  *BOWEL PROBLEMS  UNUSUAL RASH Items with * indicate a potential emergency and should be followed up as soon as possible.  Feel free to call the clinic should you have any questions or concerns. The clinic phone number is [phone].  Please show the CHEMO ALERT CARD at check-in to the Emergency Department and triage nurse.   

## 2019-06-25 NOTE — Progress Notes (Signed)
Patient Care Team: [name], MD as PCP - General (Family Medicine) Rockwell Germany, RN as Oncology Nurse Navigator Tressie Ellis, Paulette Blanch, RN as Oncology Nurse Navigator Erroll Luna, MD as Consulting Physician (General Surgery) Nicholas Lose, MD as Consulting Physician (Hematology and Oncology) Kyung Rudd, MD as Consulting Physician (Radiation Oncology)  DIAGNOSIS:    ICD-10-CM   1. Malignant neoplasm of upper-inner quadrant of right breast in female, estrogen receptor negative ([place])  C50.211    Z17.1     SUMMARY OF ONCOLOGIC HISTORY: Oncology History  Malignant neoplasm of upper-inner quadrant of right breast in female, estrogen receptor negative ([place])  01/02/2019 Initial Diagnosis   Routine screening mammogram detected a 1.2cm mass in the right breast at the 12:30 location 7 cm from the nipple, no axillary adenopathy. Biopsy showed IDC, grade 2-3, HER-2 - (1+), ER -, PR -, Ki67 80%.    01/03/2019 Cancer Staging   Staging form: Breast, AJCC 8th Edition - Clinical stage from 01/03/2019: Stage IB (cT1c, cN0, cM0, G3, ER-, PR-, HER2-) - Signed by Nicholas Lose, MD on 01/03/2019   03/01/2019 Surgery   Lumpectomy ([facility]): IDC, grade 3, 1.4cm, clear margins, 3 lymph nodes negative for carcinoma.    03/09/2019 Cancer Staging   Staging form: Breast, AJCC 8th Edition - Pathologic stage from 03/09/2019: Stage IB (pT1c, pN0, cM0, G3, ER-, PR-, HER2-) - Signed by Nicholas Lose, MD on 03/09/2019   03/27/2019 -  Chemotherapy   The patient had DOXOrubicin (ADRIAMYCIN) chemo injection 118 mg, 50 mg/m2 = 118 mg (83.3 % of original dose 60 mg/m2), Intravenous,  Once, 4 of 4 cycles Dose modification: 50 mg/m2 (original dose 60 mg/m2, Cycle 1, Reason: Provider Judgment), 45 mg/m2 (original dose 60 mg/m2, Cycle 3, Reason: Provider Judgment) Administration: 118 mg (03/27/2019), 118 mg (04/10/2019), 106 mg (04/24/2019), 106 mg (05/08/2019) palonosetron (ALOXI) injection 0.25 mg, 0.25 mg, Intravenous,   Once, 8 of 15 cycles Administration: 0.25 mg (03/27/2019), 0.25 mg (04/10/2019), 0.25 mg (04/24/2019), 0.25 mg (05/08/2019), 0.25 mg (05/29/2019), 0.25 mg (06/06/2019), 0.25 mg (06/12/2019), 0.25 mg (06/19/2019) pegfilgrastim-jmdb (FULPHILA) injection 6 mg, 6 mg, Subcutaneous,  Once, 4 of 4 cycles Administration: 6 mg (03/29/2019), 6 mg (04/12/2019), 6 mg (04/26/2019), 6 mg (05/10/2019) cyclophosphamide (CYTOXAN) 1,180 mg in sodium chloride 0.9 % 250 mL chemo infusion, 500 mg/m2 = 1,180 mg (83.3 % of original dose 600 mg/m2), Intravenous,  Once, 4 of 4 cycles Dose modification: 500 mg/m2 (original dose 600 mg/m2, Cycle 1, Reason: Provider Judgment), 450 mg/m2 (original dose 600 mg/m2, Cycle 3, Reason: Provider Judgment) Administration: 1,180 mg (03/27/2019), 1,180 mg (04/10/2019), 1,060 mg (04/24/2019), 1,060 mg (05/08/2019) PACLitaxel (TAXOL) 192 mg in sodium chloride 0.9 % 250 mL chemo infusion (</= 20m/m2), 80 mg/m2 = 192 mg, Intravenous,  Once, 5 of 12 cycles Administration: 192 mg (05/22/2019), 192 mg (05/29/2019), 192 mg (06/06/2019), 192 mg (06/12/2019), 192 mg (06/19/2019) fosaprepitant (EMEND) 150 mg, dexamethasone (DECADRON) 12 mg in sodium chloride 0.9 % 145 mL IVPB, , Intravenous,  Once, 4 of 4 cycles Administration:  (03/27/2019),  (04/10/2019),  (04/24/2019),  (05/08/2019)  for chemotherapy treatment.      CHIEF COMPLIANT: Cycle6Taxol  INTERVAL HISTORY: Kathleen Deoliveirais a 64y.o. 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 She is currently on adjuvant chemotherapywith weekly Taxol after completing 4 cycles ofdose dense Adriamycin and Cytoxan.She presents to the clinic todayforcycle6 and a toxicity check.  ALLERGIES:  is allergic to latex.  MEDICATIONS:  Current  Outpatient Medications  Medication Sig Dispense Refill  . ibuprofen (ADVIL) 800 MG tablet Take 1 tablet (800 mg total) by mouth every 8 (eight) hours as needed. 30 tablet  0  . lidocaine-prilocaine (EMLA) cream Apply to affected area once 30 g 3  . LORazepam (ATIVAN) 0.5 MG tablet Take 1 tablet (0.5 mg total) by mouth at bedtime as needed (Nausea or vomiting). 30 tablet 0  . omeprazole (PRILOSEC) 20 MG capsule Take one cap daily, 30 minutes before eating 15 capsule 1  . ondansetron (ZOFRAN) 8 MG tablet Take 1 tablet (8 mg total) by mouth 2 (two) times daily as needed. Start on the third day after chemotherapy. 30 tablet 1  . prochlorperazine (COMPAZINE) 10 MG tablet Take 1 tablet (10 mg total) by mouth every 6 (six) hours as needed (Nausea or vomiting). 30 tablet 1   No current facility-administered medications for this visit.    PHYSICAL EXAMINATION: ECOG PERFORMANCE STATUS: 1 - Symptomatic but completely ambulatory  Vitals:   06/26/19 0955  BP: 129/81  Pulse: (!) 111  Resp: 18  Temp: 97.8 F (36.6 C)  SpO2: 100%   Filed Weights   06/26/19 0955  Weight: 252 lb 12.8 oz (114.7 kg)    LABORATORY DATA:  I have reviewed the data as listed CMP Latest Ref Rng & Units 06/19/2019 06/12/2019 06/06/2019  Glucose 70 - 99 mg/dL 118(H) 123(H) 101(H)  BUN 8 - 23 mg/dL '10 12 13  ' Creatinine 0.44 - 1.00 mg/dL 0.82 0.81 0.82  Sodium 135 - 145 mmol/L 141 143 142  Potassium 3.5 - 5.1 mmol/L 3.5 3.7 3.6  Chloride 98 - 111 mmol/L 109 112(H) 111  CO2 22 - 32 mmol/L '23 23 23  ' Calcium 8.9 - 10.3 mg/dL 8.4(L) 8.3(L) 8.3(L)  Total Protein 6.5 - 8.1 g/dL 6.3(L) 6.3(L) 6.5  Total Bilirubin 0.3 - 1.2 mg/dL 0.4 0.3 0.3  Alkaline Phos 38 - 126 U/L 65 64 70  AST 15 - 41 U/L 15 14(L) 14(L)  ALT 0 - 44 U/L '17 18 18    ' Lab Results  Component Value Date   WBC 4.0 06/26/2019   HGB 10.0 (L) 06/26/2019   HCT 31.8 (L) 06/26/2019   MCV 77.2 (L) 06/26/2019   PLT 204 06/26/2019   NEUTROABS 2.9 06/26/2019    ASSESSMENT & PLAN:  Malignant neoplasm of upper-inner quadrant of right breast in female, estrogen receptor negative ([place]) 01/02/2019:Routine screening mammogram detected  a 1.2cm mass in the right breast at the 12:30 location 7 cm from the nipple, no axillary adenopathy. Biopsy showed IDC, grade 2-3, HER-2 - (1+), ER -, PR -, Ki67 80%.  03/01/2019: Right lumpectomy: Grade 3 IDC, 1.4 cm, margins clear, 0/3 lymph nodes negative, triple negative stage IIb  Treatment plan: 1.Adjuvant chemotherapy with dose dense Adriamycin and Cytoxan x4 followed by Taxol weekly x12 2.Followed by radiation therapy  Patient enrolled in SWOG 1714 clinical trial ----------------------------------------------------------------------------------------------------------------------------------------------------- Current treatment:Completed 4 cycles of dose denseAdriamycin and Cytoxan.Today is cycle6Taxol Echocardiogram August 2020: EF 60 to 65%  Chemo toxicities: 1.Nausea: Giving Aloxi with chemo 2.fatigue: Mild to moderate 3.Chemotherapy-induced anemia:Hb 10  4.  Left leg paresthesia: Will be monitored closely.  Return to clinicweekly for Taxol every other week for follow-up with me.    No orders of the defined types were placed in this encounter.  The patient has a good understanding of the overall plan. she agrees with it. she will call with any problems that may develop before the next visit  here.  Total time spent: 30 mins including face to face time and time spent for planning, charting and coordination of care  Nicholas Lose, MD 06/26/2019  I, Cloyde Reams Dorshimer, am acting as scribe for Dr. Nicholas Lose.  I have reviewed the above documentation for accuracy and completeness, and I agree with the above.

## 2019-06-26 ENCOUNTER — Inpatient Hospital Stay: Payer: BC Managed Care – PPO | Attending: Hematology and Oncology

## 2019-06-26 ENCOUNTER — Other Ambulatory Visit: Payer: Self-pay

## 2019-06-26 ENCOUNTER — Inpatient Hospital Stay: Payer: BC Managed Care – PPO

## 2019-06-26 ENCOUNTER — Inpatient Hospital Stay (HOSPITAL_BASED_OUTPATIENT_CLINIC_OR_DEPARTMENT_OTHER): Payer: BC Managed Care – PPO | Admitting: Hematology and Oncology

## 2019-06-26 VITALS — HR 100

## 2019-06-26 DIAGNOSIS — Z5111 Encounter for antineoplastic chemotherapy: Secondary | ICD-10-CM | POA: Insufficient documentation

## 2019-06-26 DIAGNOSIS — Z171 Estrogen receptor negative status [ER-]: Secondary | ICD-10-CM | POA: Insufficient documentation

## 2019-06-26 DIAGNOSIS — R110 Nausea: Secondary | ICD-10-CM | POA: Insufficient documentation

## 2019-06-26 DIAGNOSIS — C50211 Malignant neoplasm of upper-inner quadrant of right female breast: Secondary | ICD-10-CM | POA: Insufficient documentation

## 2019-06-26 DIAGNOSIS — Z95828 Presence of other vascular implants and grafts: Secondary | ICD-10-CM

## 2019-06-26 LAB — CMP (CANCER CENTER ONLY)
ALT: 19 U/L (ref 0–44)
AST: 13 U/L — ABNORMAL LOW (ref 15–41)
Albumin: 3.8 g/dL (ref 3.5–5.0)
Alkaline Phosphatase: 66 U/L (ref 38–126)
Anion gap: 9 (ref 5–15)
BUN: 13 mg/dL (ref 8–23)
CO2: 24 mmol/L (ref 22–32)
Calcium: 8.6 mg/dL — ABNORMAL LOW (ref 8.9–10.3)
Chloride: 110 mmol/L (ref 98–111)
Creatinine: 0.83 mg/dL (ref 0.44–1.00)
GFR, Est AFR Am: >60 mL/min (ref >60)
GFR, Estimated: >60 mL/min (ref >60)
Glucose, Bld: 112 mg/dL — ABNORMAL HIGH (ref 70–99)
Potassium: 3.4 mmol/L — ABNORMAL LOW (ref 3.5–5.1)
Sodium: 143 mmol/L (ref 135–145)
Total Bilirubin: 0.4 mg/dL (ref 0.3–1.2)
Total Protein: 6.6 g/dL (ref 6.5–8.1)

## 2019-06-26 LAB — CBC WITH DIFFERENTIAL (CANCER CENTER ONLY)
Abs Immature Granulocytes: 0.02 10*3/uL (ref 0.00–0.07)
Basophils Absolute: 0 10*3/uL (ref 0.0–0.1)
Basophils Relative: 0 %
Eosinophils Absolute: 0 10*3/uL (ref 0.0–0.5)
Eosinophils Relative: 1 %
HCT: 31.8 % — ABNORMAL LOW (ref 36.0–46.0)
Hemoglobin: 10 g/dL — ABNORMAL LOW (ref 12.0–15.0)
Immature Granulocytes: 1 %
Lymphocytes Relative: 19 %
Lymphs Abs: 0.8 10*3/uL (ref 0.7–4.0)
MCH: 24.3 pg — ABNORMAL LOW (ref 26.0–34.0)
MCHC: 31.4 g/dL (ref 30.0–36.0)
MCV: 77.2 fL — ABNORMAL LOW (ref 80.0–100.0)
Monocytes Absolute: 0.3 10*3/uL (ref 0.1–1.0)
Monocytes Relative: 7 %
Neutro Abs: 2.9 10*3/uL (ref 1.7–7.7)
Neutrophils Relative %: 72 %
Platelet Count: 204 10*3/uL (ref 150–400)
RBC: 4.12 MIL/uL (ref 3.87–5.11)
RDW: 19.6 % — ABNORMAL HIGH (ref 11.5–15.5)
WBC Count: 4 10*3/uL (ref 4.0–10.5)
nRBC: 0 % (ref 0.0–0.2)

## 2019-06-26 MED ORDER — DIPHENHYDRAMINE HCL 50 MG/ML IJ SOLN
INTRAMUSCULAR | Status: AC
  Filled 2019-06-26: qty 1

## 2019-06-26 MED ORDER — PALONOSETRON HCL INJECTION 0.25 MG/5ML
0.2500 mg | Freq: Once | INTRAVENOUS | Status: AC
  Administered 2019-06-26: 11:00:00 0.25 mg via INTRAVENOUS

## 2019-06-26 MED ORDER — PALONOSETRON HCL INJECTION 0.25 MG/5ML
INTRAVENOUS | Status: AC
  Filled 2019-06-26: qty 5

## 2019-06-26 MED ORDER — DEXAMETHASONE SODIUM PHOSPHATE 10 MG/ML IJ SOLN
INTRAMUSCULAR | Status: AC
  Filled 2019-06-26: qty 1

## 2019-06-26 MED ORDER — DIPHENHYDRAMINE HCL 50 MG/ML IJ SOLN
25.0000 mg | Freq: Once | INTRAMUSCULAR | Status: AC
  Administered 2019-06-26: 11:00:00 25 mg via INTRAVENOUS

## 2019-06-26 MED ORDER — FAMOTIDINE IN NACL 20-0.9 MG/50ML-% IV SOLN
INTRAVENOUS | Status: AC
  Filled 2019-06-26: qty 50

## 2019-06-26 MED ORDER — SODIUM CHLORIDE 0.9 % IV SOLN
80.0000 mg/m2 | Freq: Once | INTRAVENOUS | Status: AC
  Administered 2019-06-26: 12:00:00 192 mg via INTRAVENOUS
  Filled 2019-06-26: qty 32

## 2019-06-26 MED ORDER — SODIUM CHLORIDE 0.9% FLUSH
10.0000 mL | Freq: Once | INTRAVENOUS | Status: AC
  Administered 2019-06-26: 10:00:00 10 mL
  Filled 2019-06-26: qty 10

## 2019-06-26 MED ORDER — DEXAMETHASONE SODIUM PHOSPHATE 10 MG/ML IJ SOLN
10.0000 mg | Freq: Once | INTRAMUSCULAR | Status: AC
  Administered 2019-06-26: 11:00:00 10 mg via INTRAVENOUS

## 2019-06-26 MED ORDER — HEPARIN SOD (PORK) LOCK FLUSH 100 UNIT/ML IV SOLN
500.0000 [IU] | Freq: Once | INTRAVENOUS | Status: AC | PRN
  Administered 2019-06-26: 13:00:00 500 [IU]
  Filled 2019-06-26: qty 5

## 2019-06-26 MED ORDER — SODIUM CHLORIDE 0.9 % IV SOLN
Freq: Once | INTRAVENOUS | Status: AC
  Filled 2019-06-26: qty 250

## 2019-06-26 MED ORDER — FAMOTIDINE IN NACL 20-0.9 MG/50ML-% IV SOLN
20.0000 mg | Freq: Once | INTRAVENOUS | Status: AC
  Administered 2019-06-26: 11:00:00 20 mg via INTRAVENOUS

## 2019-06-26 MED ORDER — SODIUM CHLORIDE 0.9% FLUSH
10.0000 mL | INTRAVENOUS | Status: DC | PRN
  Administered 2019-06-26: 10 mL
  Filled 2019-06-26: qty 10

## 2019-06-26 NOTE — Assessment & Plan Note (Signed)
01/02/2019:Routine screening mammogram detected a 1.2cm mass in the right breast at the 12:30 location 7 cm from the nipple, no axillary adenopathy. Biopsy showed IDC, grade 2-3, HER-2 - (1+), ER -, PR -, Ki67 80%.  03/01/2019: Right lumpectomy: Grade 3 IDC, 1.4 cm, margins clear, 0/3 lymph nodes negative, triple negative stage IIb  Treatment plan: 1.Adjuvant chemotherapy with dose dense Adriamycin and Cytoxan x4 followed by Taxol weekly x12 2.Followed by radiation therapy  Patient enrolled in SWOG 1714 clinical trial ----------------------------------------------------------------------------------------------------------------------------------------------------- Current treatment:Completed 4 cycles of dose denseAdriamycin and Cytoxan.Today is cycle6Taxol Echocardiogram August 2020: EF 60 to 65%  Chemo toxicities: 1.Nausea: Giving Aloxi with chemo 2.fatigue: Mild to moderate 3.Chemotherapy-induced anemia:Hb 9.6  Return to clinicweekly for Taxol every other week for follow-up with me.

## 2019-06-26 NOTE — Patient Instructions (Signed)
[place] Discharge Instructions for Patients Receiving Chemotherapy  Today you received the following chemotherapy agents Taxol.  To help prevent nausea and vomiting after your treatment, we encourage you to take your nausea medication do not take [facility] for three days after treatment.   If you develop nausea and vomiting that is not controlled by your nausea medication, call the clinic.   BELOW ARE SYMPTOMS THAT SHOULD BE REPORTED IMMEDIATELY:  *FEVER GREATER THAN 100.5 F  *CHILLS WITH OR WITHOUT FEVER  NAUSEA AND VOMITING THAT IS NOT CONTROLLED WITH YOUR NAUSEA MEDICATION  *UNUSUAL SHORTNESS OF BREATH  *UNUSUAL BRUISING OR BLEEDING  TENDERNESS IN MOUTH AND THROAT WITH OR WITHOUT PRESENCE OF ULCERS  *URINARY PROBLEMS  *BOWEL PROBLEMS  UNUSUAL RASH Items with * indicate a potential emergency and should be followed up as soon as possible.  Feel free to call the clinic should you have any questions or concerns. The clinic phone number is [phone].  Please show the [place] at check-in to the Emergency Department and triage nurse.

## 2019-07-03 ENCOUNTER — Inpatient Hospital Stay: Payer: BC Managed Care – PPO

## 2019-07-03 ENCOUNTER — Other Ambulatory Visit: Payer: Self-pay

## 2019-07-03 VITALS — BP 120/98 | HR 94 | Temp 98.2°F | Resp 18 | Wt 251.5 lb

## 2019-07-03 DIAGNOSIS — Z5111 Encounter for antineoplastic chemotherapy: Secondary | ICD-10-CM | POA: Diagnosis not present

## 2019-07-03 DIAGNOSIS — Z171 Estrogen receptor negative status [ER-]: Secondary | ICD-10-CM

## 2019-07-03 DIAGNOSIS — C50211 Malignant neoplasm of upper-inner quadrant of right female breast: Secondary | ICD-10-CM

## 2019-07-03 DIAGNOSIS — Z95828 Presence of other vascular implants and grafts: Secondary | ICD-10-CM

## 2019-07-03 LAB — CBC WITH DIFFERENTIAL (CANCER CENTER ONLY)
Abs Immature Granulocytes: 0.02 10*3/uL (ref 0.00–0.07)
Basophils Absolute: 0 10*3/uL (ref 0.0–0.1)
Basophils Relative: 1 %
Eosinophils Absolute: 0 10*3/uL (ref 0.0–0.5)
Eosinophils Relative: 1 %
HCT: 30.2 % — ABNORMAL LOW (ref 36.0–46.0)
Hemoglobin: 9.4 g/dL — ABNORMAL LOW (ref 12.0–15.0)
Immature Granulocytes: 1 %
Lymphocytes Relative: 22 %
Lymphs Abs: 0.7 10*3/uL (ref 0.7–4.0)
MCH: 24.1 pg — ABNORMAL LOW (ref 26.0–34.0)
MCHC: 31.1 g/dL (ref 30.0–36.0)
MCV: 77.4 fL — ABNORMAL LOW (ref 80.0–100.0)
Monocytes Absolute: 0.3 10*3/uL (ref 0.1–1.0)
Monocytes Relative: 9 %
Neutro Abs: 2.2 10*3/uL (ref 1.7–7.7)
Neutrophils Relative %: 66 %
Platelet Count: 202 10*3/uL (ref 150–400)
RBC: 3.9 MIL/uL (ref 3.87–5.11)
RDW: 19 % — ABNORMAL HIGH (ref 11.5–15.5)
WBC Count: 3.2 10*3/uL — ABNORMAL LOW (ref 4.0–10.5)
nRBC: 0.9 % — ABNORMAL HIGH (ref 0.0–0.2)

## 2019-07-03 LAB — CMP (CANCER CENTER ONLY)
ALT: 16 U/L (ref 0–44)
AST: 14 U/L — ABNORMAL LOW (ref 15–41)
Albumin: 3.7 g/dL (ref 3.5–5.0)
Alkaline Phosphatase: 62 U/L (ref 38–126)
Anion gap: 9 (ref 5–15)
BUN: 14 mg/dL (ref 8–23)
CO2: 25 mmol/L (ref 22–32)
Calcium: 8.6 mg/dL — ABNORMAL LOW (ref 8.9–10.3)
Chloride: 110 mmol/L (ref 98–111)
Creatinine: 0.8 mg/dL (ref 0.44–1.00)
GFR, Est AFR Am: >60 mL/min (ref >60)
GFR, Estimated: >60 mL/min (ref >60)
Glucose, Bld: 112 mg/dL — ABNORMAL HIGH (ref 70–99)
Potassium: 3.3 mmol/L — ABNORMAL LOW (ref 3.5–5.1)
Sodium: 144 mmol/L (ref 135–145)
Total Bilirubin: 0.4 mg/dL (ref 0.3–1.2)
Total Protein: 6.4 g/dL — ABNORMAL LOW (ref 6.5–8.1)

## 2019-07-03 MED ORDER — SODIUM CHLORIDE 0.9% FLUSH
10.0000 mL | INTRAVENOUS | Status: DC | PRN
  Administered 2019-07-03: 10 mL
  Filled 2019-07-03: qty 10

## 2019-07-03 MED ORDER — FAMOTIDINE IN NACL 20-0.9 MG/50ML-% IV SOLN
INTRAVENOUS | Status: AC
  Filled 2019-07-03: qty 50

## 2019-07-03 MED ORDER — DIPHENHYDRAMINE HCL 50 MG/ML IJ SOLN
INTRAMUSCULAR | Status: AC
  Filled 2019-07-03: qty 1

## 2019-07-03 MED ORDER — SODIUM CHLORIDE 0.9 % IV SOLN
80.0000 mg/m2 | Freq: Once | INTRAVENOUS | Status: AC
  Administered 2019-07-03: 192 mg via INTRAVENOUS
  Filled 2019-07-03: qty 32

## 2019-07-03 MED ORDER — SODIUM CHLORIDE 0.9 % IV SOLN
Freq: Once | INTRAVENOUS | Status: AC
  Filled 2019-07-03: qty 250

## 2019-07-03 MED ORDER — FAMOTIDINE IN NACL 20-0.9 MG/50ML-% IV SOLN
20.0000 mg | Freq: Once | INTRAVENOUS | Status: AC
  Administered 2019-07-03: 20 mg via INTRAVENOUS

## 2019-07-03 MED ORDER — DIPHENHYDRAMINE HCL 50 MG/ML IJ SOLN
25.0000 mg | Freq: Once | INTRAMUSCULAR | Status: AC
  Administered 2019-07-03: 25 mg via INTRAVENOUS

## 2019-07-03 MED ORDER — SODIUM CHLORIDE 0.9% FLUSH
10.0000 mL | Freq: Once | INTRAVENOUS | Status: AC
  Administered 2019-07-03: 08:00:00 10 mL
  Filled 2019-07-03: qty 10

## 2019-07-03 MED ORDER — HEPARIN SOD (PORK) LOCK FLUSH 100 UNIT/ML IV SOLN
500.0000 [IU] | Freq: Once | INTRAVENOUS | Status: AC | PRN
  Administered 2019-07-03: 500 [IU]
  Filled 2019-07-03: qty 5

## 2019-07-03 MED ORDER — DEXAMETHASONE SODIUM PHOSPHATE 10 MG/ML IJ SOLN
10.0000 mg | Freq: Once | INTRAMUSCULAR | Status: AC
  Administered 2019-07-03: 10 mg via INTRAVENOUS

## 2019-07-03 MED ORDER — PALONOSETRON HCL INJECTION 0.25 MG/5ML
INTRAVENOUS | Status: AC
  Filled 2019-07-03: qty 5

## 2019-07-03 MED ORDER — DEXAMETHASONE SODIUM PHOSPHATE 10 MG/ML IJ SOLN
INTRAMUSCULAR | Status: AC
  Filled 2019-07-03: qty 1

## 2019-07-03 MED ORDER — PALONOSETRON HCL INJECTION 0.25 MG/5ML
0.2500 mg | Freq: Once | INTRAVENOUS | Status: AC
  Administered 2019-07-03: 0.25 mg via INTRAVENOUS

## 2019-07-03 NOTE — Patient Instructions (Signed)

## 2019-07-03 NOTE — Patient Instructions (Signed)
[facility] Discharge Instructions for Patients Receiving Chemotherapy  Today you received the following chemotherapy agents Taxol.  To help prevent nausea and vomiting after your treatment, we encourage you to take your nausea medication do not take [facility] for three days after treatment.   If you develop nausea and vomiting that is not controlled by your nausea medication, call the clinic.   BELOW ARE SYMPTOMS THAT SHOULD BE REPORTED IMMEDIATELY:  *FEVER GREATER THAN 100.5 F  *CHILLS WITH OR WITHOUT FEVER  NAUSEA AND VOMITING THAT IS NOT CONTROLLED WITH YOUR NAUSEA MEDICATION  *UNUSUAL SHORTNESS OF BREATH  *UNUSUAL BRUISING OR BLEEDING  TENDERNESS IN MOUTH AND THROAT WITH OR WITHOUT PRESENCE OF ULCERS  *URINARY PROBLEMS  *BOWEL PROBLEMS  UNUSUAL RASH Items with * indicate a potential emergency and should be followed up as soon as possible.  Feel free to call the clinic should you have any questions or concerns. The clinic phone number is [phone].  Please show the [place] at check-in to the Emergency Department and triage nurse.

## 2019-07-09 NOTE — Progress Notes (Signed)
Patient Care Team: Kathleen Lass, MD as PCP - General (Family Medicine) Rockwell Germany, RN as Oncology Nurse Navigator Kathleen Potter, Kathleen Blanch, RN as Oncology Nurse Navigator Kathleen Luna, MD as Consulting Physician (General Surgery) Kathleen Lose, MD as Consulting Physician (Hematology and Oncology) Kathleen Rudd, MD as Consulting Physician (Radiation Oncology)  DIAGNOSIS:    ICD-10-CM   1. Malignant neoplasm of upper-inner quadrant of right breast in female, estrogen receptor negative ([place])  C50.211    Z17.1     SUMMARY OF ONCOLOGIC HISTORY: Oncology History  Malignant neoplasm of upper-inner quadrant of right breast in female, estrogen receptor negative ([place])  01/02/2019 Initial Diagnosis   Routine screening mammogram detected a 1.2cm mass in the right breast at the 12:30 location 7 cm from the nipple, no axillary adenopathy. Biopsy showed IDC, grade 2-3, HER-2 - (1+), ER -, PR -, Ki67 80%.    01/03/2019 Cancer Staging   Staging form: Breast, AJCC 8th Edition - Clinical stage from 01/03/2019: Stage IB (cT1c, cN0, cM0, G3, ER-, PR-, HER2-) - Signed by Kathleen Lose, MD on 01/03/2019   03/01/2019 Surgery   Lumpectomy ([facility]): IDC, grade 3, 1.4cm, clear margins, 3 lymph nodes negative for carcinoma.    03/09/2019 Cancer Staging   Staging form: Breast, AJCC 8th Edition - Pathologic stage from 03/09/2019: Stage IB (pT1c, pN0, cM0, G3, ER-, PR-, HER2-) - Signed by Kathleen Lose, MD on 03/09/2019   03/27/2019 -  Chemotherapy   The patient had DOXOrubicin (ADRIAMYCIN) chemo injection 118 mg, 50 mg/m2 = 118 mg (83.3 % of original dose 60 mg/m2), Intravenous,  Once, 4 of 4 cycles Dose modification: 50 mg/m2 (original dose 60 mg/m2, Cycle 1, Reason: Provider Judgment), 45 mg/m2 (original dose 60 mg/m2, Cycle 3, Reason: Provider Judgment) Administration: 118 mg (03/27/2019), 118 mg (04/10/2019), 106 mg (04/24/2019), 106 mg (05/08/2019) palonosetron (ALOXI) injection 0.25 mg, 0.25 mg, Intravenous,   Once, 10 of 15 cycles Administration: 0.25 mg (03/27/2019), 0.25 mg (04/10/2019), 0.25 mg (04/24/2019), 0.25 mg (05/08/2019), 0.25 mg (05/29/2019), 0.25 mg (06/06/2019), 0.25 mg (06/12/2019), 0.25 mg (06/19/2019), 0.25 mg (06/26/2019), 0.25 mg (07/03/2019) pegfilgrastim-jmdb (FULPHILA) injection 6 mg, 6 mg, Subcutaneous,  Once, 4 of 4 cycles Administration: 6 mg (03/29/2019), 6 mg (04/12/2019), 6 mg (04/26/2019), 6 mg (05/10/2019) cyclophosphamide (CYTOXAN) 1,180 mg in sodium chloride 0.9 % 250 mL chemo infusion, 500 mg/m2 = 1,180 mg (83.3 % of original dose 600 mg/m2), Intravenous,  Once, 4 of 4 cycles Dose modification: 500 mg/m2 (original dose 600 mg/m2, Cycle 1, Reason: Provider Judgment), 450 mg/m2 (original dose 600 mg/m2, Cycle 3, Reason: Provider Judgment) Administration: 1,180 mg (03/27/2019), 1,180 mg (04/10/2019), 1,060 mg (04/24/2019), 1,060 mg (05/08/2019) PACLitaxel (TAXOL) 192 mg in sodium chloride 0.9 % 250 mL chemo infusion (</= 15m/m2), 80 mg/m2 = 192 mg, Intravenous,  Once, 7 of 12 cycles Administration: 192 mg (05/22/2019), 192 mg (05/29/2019), 192 mg (06/06/2019), 192 mg (06/12/2019), 192 mg (06/19/2019), 192 mg (06/26/2019), 192 mg (07/03/2019) fosaprepitant (EMEND) 150 mg, dexamethasone (DECADRON) 12 mg in sodium chloride 0.9 % 145 mL IVPB, , Intravenous,  Once, 4 of 4 cycles Administration:  (03/27/2019),  (04/10/2019),  (04/24/2019),  (05/08/2019)  for chemotherapy treatment.      CHIEF COMPLIANT: Cycle8Taxol  INTERVAL HISTORY: Kathleen Potter a 64y.o. 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 She is currently on adjuvant chemotherapywith weekly Taxol after completing 4 cycles ofdose dense Adriamycin and Cytoxan.She presents to the clinic todayforcycle8and a toxicity check.  Patient has noticed early onset of neuropathy symptoms in the fingers and toes.  She also had some muscle aches and pains.  ALLERGIES:  is allergic to  latex.  MEDICATIONS:  Current Outpatient Medications  Medication Sig Dispense Refill  . ibuprofen (ADVIL) 800 MG tablet Take 1 tablet (800 mg total) by mouth every 8 (eight) hours as needed. 30 tablet 0  . lidocaine-prilocaine (EMLA) cream Apply to affected area once 30 g 3  . LORazepam (ATIVAN) 0.5 MG tablet Take 1 tablet (0.5 mg total) by mouth at bedtime as needed (Nausea or vomiting). 30 tablet 0  . omeprazole (PRILOSEC) 20 MG capsule Take one cap daily, 30 minutes before eating 15 capsule 1  . ondansetron (ZOFRAN) 8 MG tablet Take 1 tablet (8 mg total) by mouth 2 (two) times daily as needed. Start on the third day after chemotherapy. 30 tablet 1  . prochlorperazine (COMPAZINE) 10 MG tablet Take 1 tablet (10 mg total) by mouth every 6 (six) hours as needed (Nausea or vomiting). 30 tablet 1   No current facility-administered medications for this visit.    PHYSICAL EXAMINATION: ECOG PERFORMANCE STATUS: 1 - Symptomatic but completely ambulatory  Vitals:   07/10/19 0847  BP: (!) 150/87  Pulse: (!) 109  Resp: 18  Temp: 98.3 F (36.8 C)  SpO2: 100%   Filed Weights   07/10/19 0847  Weight: 249 lb 14.4 oz (113.4 kg)    LABORATORY DATA:  I have reviewed the data as listed CMP Latest Ref Rng & Units 07/03/2019 06/26/2019 06/19/2019  Glucose 70 - 99 mg/dL 112(H) 112(H) 118(H)  BUN 8 - 23 mg/dL '14 13 10  ' Creatinine 0.44 - 1.00 mg/dL 0.80 0.83 0.82  Sodium 135 - 145 mmol/L 144 143 141  Potassium 3.5 - 5.1 mmol/L 3.3(L) 3.4(L) 3.5  Chloride 98 - 111 mmol/L 110 110 109  CO2 22 - 32 mmol/L '25 24 23  ' Calcium 8.9 - 10.3 mg/dL 8.6(L) 8.6(L) 8.4(L)  Total Protein 6.5 - 8.1 g/dL 6.4(L) 6.6 6.3(L)  Total Bilirubin 0.3 - 1.2 mg/dL 0.4 0.4 0.4  Alkaline Phos 38 - 126 U/L 62 66 65  AST 15 - 41 U/L 14(L) 13(L) 15  ALT 0 - 44 U/L '16 19 17    ' Lab Results  Component Value Date   WBC 3.8 (L) 07/10/2019   HGB 9.8 (L) 07/10/2019   HCT 30.9 (L) 07/10/2019   MCV 76.5 (L) 07/10/2019   PLT 199  07/10/2019   NEUTROABS 2.6 07/10/2019    ASSESSMENT & PLAN:  Malignant neoplasm of upper-inner quadrant of right breast in female, estrogen receptor negative ([place]) 01/02/2019:Routine screening mammogram detected a 1.2cm mass in the right breast at the 12:30 location 7 cm from the nipple, no axillary adenopathy. Biopsy showed IDC, grade 2-3, HER-2 - (1+), ER -, PR -, Ki67 80%.  03/01/2019: Right lumpectomy: Grade 3 IDC, 1.4 cm, margins clear, 0/3 lymph nodes negative, triple negative stage IIb  Treatment plan: 1.Adjuvant chemotherapy with dose dense Adriamycin and Cytoxan x4 followed by Taxol weekly x12 2.Followed by radiation therapy  Patient enrolled in SWOG 1714 clinical trial ----------------------------------------------------------------------------------------------------------------------------------------------------- Current treatment:Completed 4 cycles of dose denseAdriamycin and Cytoxan.Today is cycle8Taxol Echocardiogram August 2020: EF 60 to 65%  Chemo toxicities: 1.Nausea:No further issues 2.fatigue:Moderate 3.Chemotherapy-induced anemia:Hb  is 9.8 4.    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  We will reduce the dosage of her chemotherapy today. I discussed with her if her symptoms continue to get worse we  can reduce her chemo even further or discontinue it if she starts dropping objects and has difficulty with buttoning her shirt etc. 5.  Hypokalemia: Instructed on potassium containing foods.  Return to clinicweekly for Taxol every other week for follow-up with me.    No orders of the defined types were placed in this encounter.  The patient has a good understanding of the overall plan. she agrees with it. she will call with any problems that may develop before the next visit here.  Total time spent: 30 mins including face to face time and time spent for planning, charting and coordination of care  Kathleen Lose,  MD 07/10/2019  I, Cloyde Reams Dorshimer, am acting as scribe for Dr. Nicholas Potter.  I have reviewed the above documentation for accuracy and completeness, and I agree with the above.

## 2019-07-10 ENCOUNTER — Inpatient Hospital Stay: Payer: BC Managed Care – PPO

## 2019-07-10 ENCOUNTER — Other Ambulatory Visit: Payer: Self-pay | Admitting: Hematology and Oncology

## 2019-07-10 ENCOUNTER — Encounter: Payer: Self-pay | Admitting: *Deleted

## 2019-07-10 ENCOUNTER — Other Ambulatory Visit: Payer: Self-pay

## 2019-07-10 ENCOUNTER — Inpatient Hospital Stay (HOSPITAL_BASED_OUTPATIENT_CLINIC_OR_DEPARTMENT_OTHER): Payer: BC Managed Care – PPO | Admitting: Hematology and Oncology

## 2019-07-10 VITALS — HR 99

## 2019-07-10 DIAGNOSIS — Z5111 Encounter for antineoplastic chemotherapy: Secondary | ICD-10-CM | POA: Diagnosis not present

## 2019-07-10 DIAGNOSIS — C50211 Malignant neoplasm of upper-inner quadrant of right female breast: Secondary | ICD-10-CM

## 2019-07-10 DIAGNOSIS — Z171 Estrogen receptor negative status [ER-]: Secondary | ICD-10-CM

## 2019-07-10 DIAGNOSIS — Z95828 Presence of other vascular implants and grafts: Secondary | ICD-10-CM

## 2019-07-10 LAB — CMP (CANCER CENTER ONLY)
ALT: 17 U/L (ref 0–44)
AST: 14 U/L — ABNORMAL LOW (ref 15–41)
Albumin: 3.7 g/dL (ref 3.5–5.0)
Alkaline Phosphatase: 66 U/L (ref 38–126)
Anion gap: 10 (ref 5–15)
BUN: 12 mg/dL (ref 8–23)
CO2: 25 mmol/L (ref 22–32)
Calcium: 8.5 mg/dL — ABNORMAL LOW (ref 8.9–10.3)
Chloride: 110 mmol/L (ref 98–111)
Creatinine: 0.87 mg/dL (ref 0.44–1.00)
GFR, Est AFR Am: >60 mL/min (ref >60)
GFR, Estimated: >60 mL/min (ref >60)
Glucose, Bld: 114 mg/dL — ABNORMAL HIGH (ref 70–99)
Potassium: 3.2 mmol/L — ABNORMAL LOW (ref 3.5–5.1)
Sodium: 145 mmol/L (ref 135–145)
Total Bilirubin: 0.4 mg/dL (ref 0.3–1.2)
Total Protein: 6.5 g/dL (ref 6.5–8.1)

## 2019-07-10 LAB — CBC WITH DIFFERENTIAL (CANCER CENTER ONLY)
Abs Immature Granulocytes: 0.02 10*3/uL (ref 0.00–0.07)
Basophils Absolute: 0 10*3/uL (ref 0.0–0.1)
Basophils Relative: 0 %
Eosinophils Absolute: 0 10*3/uL (ref 0.0–0.5)
Eosinophils Relative: 1 %
HCT: 30.9 % — ABNORMAL LOW (ref 36.0–46.0)
Hemoglobin: 9.8 g/dL — ABNORMAL LOW (ref 12.0–15.0)
Immature Granulocytes: 1 %
Lymphocytes Relative: 21 %
Lymphs Abs: 0.8 10*3/uL (ref 0.7–4.0)
MCH: 24.3 pg — ABNORMAL LOW (ref 26.0–34.0)
MCHC: 31.7 g/dL (ref 30.0–36.0)
MCV: 76.5 fL — ABNORMAL LOW (ref 80.0–100.0)
Monocytes Absolute: 0.3 10*3/uL (ref 0.1–1.0)
Monocytes Relative: 9 %
Neutro Abs: 2.6 10*3/uL (ref 1.7–7.7)
Neutrophils Relative %: 68 %
Platelet Count: 199 10*3/uL (ref 150–400)
RBC: 4.04 MIL/uL (ref 3.87–5.11)
RDW: 18.6 % — ABNORMAL HIGH (ref 11.5–15.5)
WBC Count: 3.8 10*3/uL — ABNORMAL LOW (ref 4.0–10.5)
nRBC: 0.8 % — ABNORMAL HIGH (ref 0.0–0.2)

## 2019-07-10 MED ORDER — POTASSIUM CHLORIDE ER 10 MEQ PO TBCR: 10.0000 meq | EXTENDED_RELEASE_TABLET | Freq: Every day | ORAL | 0 refills | Status: DC

## 2019-07-10 MED ORDER — SODIUM CHLORIDE 0.9 % IV SOLN
65.0000 mg/m2 | Freq: Once | INTRAVENOUS | Status: AC
  Administered 2019-07-10: 156 mg via INTRAVENOUS
  Filled 2019-07-10: qty 26

## 2019-07-10 MED ORDER — FAMOTIDINE IN NACL 20-0.9 MG/50ML-% IV SOLN
INTRAVENOUS | Status: AC
  Filled 2019-07-10: qty 50

## 2019-07-10 MED ORDER — SODIUM CHLORIDE 0.9% FLUSH
10.0000 mL | Freq: Once | INTRAVENOUS | Status: AC
  Administered 2019-07-10: 10 mL
  Filled 2019-07-10: qty 10

## 2019-07-10 MED ORDER — SODIUM CHLORIDE 0.9 % IV SOLN
Freq: Once | INTRAVENOUS | Status: AC
  Filled 2019-07-10: qty 250

## 2019-07-10 MED ORDER — DIPHENHYDRAMINE HCL 50 MG/ML IJ SOLN
25.0000 mg | Freq: Once | INTRAMUSCULAR | Status: AC
  Administered 2019-07-10: 25 mg via INTRAVENOUS

## 2019-07-10 MED ORDER — PALONOSETRON HCL INJECTION 0.25 MG/5ML
0.2500 mg | Freq: Once | INTRAVENOUS | Status: AC
  Administered 2019-07-10: 0.25 mg via INTRAVENOUS

## 2019-07-10 MED ORDER — SODIUM CHLORIDE 0.9% FLUSH
10.0000 mL | INTRAVENOUS | Status: DC | PRN
  Administered 2019-07-10: 10 mL
  Filled 2019-07-10: qty 10

## 2019-07-10 MED ORDER — HEPARIN SOD (PORK) LOCK FLUSH 100 UNIT/ML IV SOLN
500.0000 [IU] | Freq: Once | INTRAVENOUS | Status: AC | PRN
  Administered 2019-07-10: 500 [IU]
  Filled 2019-07-10: qty 5

## 2019-07-10 MED ORDER — PALONOSETRON HCL INJECTION 0.25 MG/5ML
INTRAVENOUS | Status: AC
  Filled 2019-07-10: qty 5

## 2019-07-10 MED ORDER — DEXAMETHASONE SODIUM PHOSPHATE 10 MG/ML IJ SOLN
10.0000 mg | Freq: Once | INTRAMUSCULAR | Status: AC
  Administered 2019-07-10: 10 mg via INTRAVENOUS

## 2019-07-10 MED ORDER — DEXAMETHASONE SODIUM PHOSPHATE 10 MG/ML IJ SOLN
INTRAMUSCULAR | Status: AC
  Filled 2019-07-10: qty 1

## 2019-07-10 MED ORDER — FAMOTIDINE IN NACL 20-0.9 MG/50ML-% IV SOLN
20.0000 mg | Freq: Once | INTRAVENOUS | Status: AC
  Administered 2019-07-10: 20 mg via INTRAVENOUS

## 2019-07-10 MED ORDER — DIPHENHYDRAMINE HCL 50 MG/ML IJ SOLN
INTRAMUSCULAR | Status: AC
  Filled 2019-07-10: qty 1

## 2019-07-10 NOTE — Assessment & Plan Note (Signed)
01/02/2019:Routine screening mammogram detected a 1.2cm mass in the right breast at the 12:30 location 7 cm from the nipple, no axillary adenopathy. Biopsy showed IDC, grade 2-3, HER-2 - (1+), ER -, PR -, Ki67 80%.  03/01/2019: Right lumpectomy: Grade 3 IDC, 1.4 cm, margins clear, 0/3 lymph nodes negative, triple negative stage IIb  Treatment plan: 1.Adjuvant chemotherapy with dose dense Adriamycin and Cytoxan x4 followed by Taxol weekly x12 2.Followed by radiation therapy  Patient enrolled in SWOG 1714 clinical trial ----------------------------------------------------------------------------------------------------------------------------------------------------- Current treatment:Completed 4 cycles of dose denseAdriamycin and Cytoxan.Today is cycle8Taxol Echocardiogram August 2020: EF 60 to 65%  Chemo toxicities: 1.Nausea:No further issues 2.fatigue:Moderate 3.Chemotherapy-induced anemia:Hb   4.  Left leg paresthesia: Mild.  Return to clinicweekly for Taxol every other week for follow-up with me.

## 2019-07-10 NOTE — Patient Instructions (Signed)
[facility] Discharge Instructions for Patients Receiving Chemotherapy  Today you received the following chemotherapy agents: paclitaxel.  To help prevent nausea and vomiting after your treatment, we encourage you to take your nausea medication as directed.   If you develop nausea and vomiting that is not controlled by your nausea medication, call the clinic.   BELOW ARE SYMPTOMS THAT SHOULD BE REPORTED IMMEDIATELY:  *FEVER GREATER THAN 100.5 F  *CHILLS WITH OR WITHOUT FEVER  NAUSEA AND VOMITING THAT IS NOT CONTROLLED WITH YOUR NAUSEA MEDICATION  *UNUSUAL SHORTNESS OF BREATH  *UNUSUAL BRUISING OR BLEEDING  TENDERNESS IN MOUTH AND THROAT WITH OR WITHOUT PRESENCE OF ULCERS  *URINARY PROBLEMS  *BOWEL PROBLEMS  UNUSUAL RASH Items with * indicate a potential emergency and should be followed up as soon as possible.  Feel free to call the clinic should you have any questions or concerns. The clinic phone number is [phone].  Please show the CHEMO ALERT CARD at check-in to the Emergency Department and triage nurse.   

## 2019-07-10 NOTE — Progress Notes (Signed)
For potassium 3.2, I sent a prescription for 10 mEq of potassium chloride

## 2019-07-17 ENCOUNTER — Inpatient Hospital Stay: Payer: BC Managed Care – PPO

## 2019-07-17 ENCOUNTER — Other Ambulatory Visit: Payer: Self-pay

## 2019-07-17 VITALS — BP 147/74 | HR 82 | Temp 97.8°F | Resp 18

## 2019-07-17 DIAGNOSIS — Z171 Estrogen receptor negative status [ER-]: Secondary | ICD-10-CM

## 2019-07-17 DIAGNOSIS — C50211 Malignant neoplasm of upper-inner quadrant of right female breast: Secondary | ICD-10-CM

## 2019-07-17 DIAGNOSIS — Z95828 Presence of other vascular implants and grafts: Secondary | ICD-10-CM

## 2019-07-17 DIAGNOSIS — Z5111 Encounter for antineoplastic chemotherapy: Secondary | ICD-10-CM | POA: Diagnosis not present

## 2019-07-17 LAB — CMP (CANCER CENTER ONLY)
ALT: 18 U/L (ref 0–44)
AST: 14 U/L — ABNORMAL LOW (ref 15–41)
Albumin: 3.6 g/dL (ref 3.5–5.0)
Alkaline Phosphatase: 62 U/L (ref 38–126)
Anion gap: 8 (ref 5–15)
BUN: 14 mg/dL (ref 8–23)
CO2: 26 mmol/L (ref 22–32)
Calcium: 8.5 mg/dL — ABNORMAL LOW (ref 8.9–10.3)
Chloride: 110 mmol/L (ref 98–111)
Creatinine: 0.8 mg/dL (ref 0.44–1.00)
GFR, Est AFR Am: >60 mL/min (ref >60)
GFR, Estimated: >60 mL/min (ref >60)
Glucose, Bld: 113 mg/dL — ABNORMAL HIGH (ref 70–99)
Potassium: 3.1 mmol/L — ABNORMAL LOW (ref 3.5–5.1)
Sodium: 144 mmol/L (ref 135–145)
Total Bilirubin: 0.4 mg/dL (ref 0.3–1.2)
Total Protein: 6.2 g/dL — ABNORMAL LOW (ref 6.5–8.1)

## 2019-07-17 LAB — CBC WITH DIFFERENTIAL (CANCER CENTER ONLY)
Abs Immature Granulocytes: 0.02 10*3/uL (ref 0.00–0.07)
Basophils Absolute: 0 10*3/uL (ref 0.0–0.1)
Basophils Relative: 0 %
Eosinophils Absolute: 0 10*3/uL (ref 0.0–0.5)
Eosinophils Relative: 1 %
HCT: 29.7 % — ABNORMAL LOW (ref 36.0–46.0)
Hemoglobin: 9.4 g/dL — ABNORMAL LOW (ref 12.0–15.0)
Immature Granulocytes: 1 %
Lymphocytes Relative: 20 %
Lymphs Abs: 0.8 10*3/uL (ref 0.7–4.0)
MCH: 24.9 pg — ABNORMAL LOW (ref 26.0–34.0)
MCHC: 31.6 g/dL (ref 30.0–36.0)
MCV: 78.8 fL — ABNORMAL LOW (ref 80.0–100.0)
Monocytes Absolute: 0.3 10*3/uL (ref 0.1–1.0)
Monocytes Relative: 8 %
Neutro Abs: 2.6 10*3/uL (ref 1.7–7.7)
Neutrophils Relative %: 70 %
Platelet Count: 194 10*3/uL (ref 150–400)
RBC: 3.77 MIL/uL — ABNORMAL LOW (ref 3.87–5.11)
RDW: 18.3 % — ABNORMAL HIGH (ref 11.5–15.5)
WBC Count: 3.7 10*3/uL — ABNORMAL LOW (ref 4.0–10.5)
nRBC: 0.8 % — ABNORMAL HIGH (ref 0.0–0.2)

## 2019-07-17 MED ORDER — FAMOTIDINE IN NACL 20-0.9 MG/50ML-% IV SOLN
20.0000 mg | Freq: Once | INTRAVENOUS | Status: AC
  Administered 2019-07-17: 20 mg via INTRAVENOUS

## 2019-07-17 MED ORDER — SODIUM CHLORIDE 0.9 % IV SOLN
65.0000 mg/m2 | Freq: Once | INTRAVENOUS | Status: AC
  Administered 2019-07-17: 156 mg via INTRAVENOUS
  Filled 2019-07-17: qty 26

## 2019-07-17 MED ORDER — PALONOSETRON HCL INJECTION 0.25 MG/5ML
0.2500 mg | Freq: Once | INTRAVENOUS | Status: AC
  Administered 2019-07-17: 0.25 mg via INTRAVENOUS

## 2019-07-17 MED ORDER — PALONOSETRON HCL INJECTION 0.25 MG/5ML
INTRAVENOUS | Status: AC
  Filled 2019-07-17: qty 5

## 2019-07-17 MED ORDER — DEXAMETHASONE SODIUM PHOSPHATE 10 MG/ML IJ SOLN
10.0000 mg | Freq: Once | INTRAMUSCULAR | Status: AC
  Administered 2019-07-17: 10 mg via INTRAVENOUS

## 2019-07-17 MED ORDER — DIPHENHYDRAMINE HCL 50 MG/ML IJ SOLN
INTRAMUSCULAR | Status: AC
  Filled 2019-07-17: qty 1

## 2019-07-17 MED ORDER — SODIUM CHLORIDE 0.9% FLUSH
10.0000 mL | Freq: Once | INTRAVENOUS | Status: AC
  Administered 2019-07-17: 08:00:00 10 mL
  Filled 2019-07-17: qty 10

## 2019-07-17 MED ORDER — DEXAMETHASONE SODIUM PHOSPHATE 10 MG/ML IJ SOLN
INTRAMUSCULAR | Status: AC
  Filled 2019-07-17: qty 1

## 2019-07-17 MED ORDER — DIPHENHYDRAMINE HCL 50 MG/ML IJ SOLN
25.0000 mg | Freq: Once | INTRAMUSCULAR | Status: AC
  Administered 2019-07-17: 09:00:00 25 mg via INTRAVENOUS

## 2019-07-17 MED ORDER — SODIUM CHLORIDE 0.9 % IV SOLN
Freq: Once | INTRAVENOUS | Status: AC
  Filled 2019-07-17: qty 250

## 2019-07-17 MED ORDER — HEPARIN SOD (PORK) LOCK FLUSH 100 UNIT/ML IV SOLN
500.0000 [IU] | Freq: Once | INTRAVENOUS | Status: AC | PRN
  Administered 2019-07-17: 500 [IU]
  Filled 2019-07-17: qty 5

## 2019-07-17 MED ORDER — FAMOTIDINE IN NACL 20-0.9 MG/50ML-% IV SOLN
INTRAVENOUS | Status: AC
  Filled 2019-07-17: qty 50

## 2019-07-17 MED ORDER — SODIUM CHLORIDE 0.9% FLUSH
10.0000 mL | INTRAVENOUS | Status: DC | PRN
  Administered 2019-07-17: 10 mL
  Filled 2019-07-17: qty 10

## 2019-07-17 NOTE — Patient Instructions (Signed)
[facility] Discharge Instructions for Patients Receiving Chemotherapy  Today you received the following chemotherapy agents: paclitaxel.  To help prevent nausea and vomiting after your treatment, we encourage you to take your nausea medication as directed.   If you develop nausea and vomiting that is not controlled by your nausea medication, call the clinic.   BELOW ARE SYMPTOMS THAT SHOULD BE REPORTED IMMEDIATELY:  *FEVER GREATER THAN 100.5 F  *CHILLS WITH OR WITHOUT FEVER  NAUSEA AND VOMITING THAT IS NOT CONTROLLED WITH YOUR NAUSEA MEDICATION  *UNUSUAL SHORTNESS OF BREATH  *UNUSUAL BRUISING OR BLEEDING  TENDERNESS IN MOUTH AND THROAT WITH OR WITHOUT PRESENCE OF ULCERS  *URINARY PROBLEMS  *BOWEL PROBLEMS  UNUSUAL RASH Items with * indicate a potential emergency and should be followed up as soon as possible.  Feel free to call the clinic should you have any questions or concerns. The clinic phone number is [phone].  Please show the CHEMO ALERT CARD at check-in to the Emergency Department and triage nurse.   

## 2019-07-23 NOTE — Progress Notes (Signed)
Patient Care Team: [name], MD as PCP - General (Family Medicine) Rockwell Germany, RN as Oncology Nurse Navigator Tressie Ellis, Paulette Blanch, RN as Oncology Nurse Navigator Erroll Luna, MD as Consulting Physician (General Surgery) Nicholas Lose, MD as Consulting Physician (Hematology and Oncology) Kyung Rudd, MD as Consulting Physician (Radiation Oncology)  DIAGNOSIS:    ICD-10-CM   1. Malignant neoplasm of upper-inner quadrant of right breast in female, estrogen receptor negative ([place])  C50.211    Z17.1     SUMMARY OF ONCOLOGIC HISTORY: Oncology History  Malignant neoplasm of upper-inner quadrant of right breast in female, estrogen receptor negative ([place])  01/02/2019 Initial Diagnosis   Routine screening mammogram detected a 1.2cm mass in the right breast at the 12:30 location 7 cm from the nipple, no axillary adenopathy. Biopsy showed IDC, grade 2-3, HER-2 - (1+), ER -, PR -, Ki67 80%.    01/03/2019 Cancer Staging   Staging form: Breast, AJCC 8th Edition - Clinical stage from 01/03/2019: Stage IB (cT1c, cN0, cM0, G3, ER-, PR-, HER2-) - Signed by Nicholas Lose, MD on 01/03/2019   03/01/2019 Surgery   Lumpectomy ([facility]): IDC, grade 3, 1.4cm, clear margins, 3 lymph nodes negative for carcinoma.    03/09/2019 Cancer Staging   Staging form: Breast, AJCC 8th Edition - Pathologic stage from 03/09/2019: Stage IB (pT1c, pN0, cM0, G3, ER-, PR-, HER2-) - Signed by Nicholas Lose, MD on 03/09/2019   03/27/2019 -  Chemotherapy   The patient had DOXOrubicin (ADRIAMYCIN) chemo injection 118 mg, 50 mg/m2 = 118 mg (83.3 % of original dose 60 mg/m2), Intravenous,  Once, 4 of 4 cycles Dose modification: 50 mg/m2 (original dose 60 mg/m2, Cycle 1, Reason: Provider Judgment), 45 mg/m2 (original dose 60 mg/m2, Cycle 3, Reason: Provider Judgment) Administration: 118 mg (03/27/2019), 118 mg (04/10/2019), 106 mg (04/24/2019), 106 mg (05/08/2019) palonosetron (ALOXI) injection 0.25 mg, 0.25 mg, Intravenous,   Once, 12 of 15 cycles Administration: 0.25 mg (03/27/2019), 0.25 mg (04/10/2019), 0.25 mg (04/24/2019), 0.25 mg (05/08/2019), 0.25 mg (05/29/2019), 0.25 mg (06/06/2019), 0.25 mg (06/12/2019), 0.25 mg (06/19/2019), 0.25 mg (06/26/2019), 0.25 mg (07/03/2019), 0.25 mg (07/10/2019), 0.25 mg (07/17/2019) pegfilgrastim-jmdb (FULPHILA) injection 6 mg, 6 mg, Subcutaneous,  Once, 4 of 4 cycles Administration: 6 mg (03/29/2019), 6 mg (04/12/2019), 6 mg (04/26/2019), 6 mg (05/10/2019) cyclophosphamide (CYTOXAN) 1,180 mg in sodium chloride 0.9 % 250 mL chemo infusion, 500 mg/m2 = 1,180 mg (83.3 % of original dose 600 mg/m2), Intravenous,  Once, 4 of 4 cycles Dose modification: 500 mg/m2 (original dose 600 mg/m2, Cycle 1, Reason: Provider Judgment), 450 mg/m2 (original dose 600 mg/m2, Cycle 3, Reason: Provider Judgment) Administration: 1,180 mg (03/27/2019), 1,180 mg (04/10/2019), 1,060 mg (04/24/2019), 1,060 mg (05/08/2019) PACLitaxel (TAXOL) 192 mg in sodium chloride 0.9 % 250 mL chemo infusion (</= 66m/m2), 80 mg/m2 = 192 mg, Intravenous,  Once, 9 of 12 cycles Dose modification: 65 mg/m2 (original dose 80 mg/m2, Cycle 12, Reason: Dose not tolerated) Administration: 192 mg (05/22/2019), 192 mg (05/29/2019), 192 mg (06/06/2019), 192 mg (06/12/2019), 192 mg (06/19/2019), 192 mg (06/26/2019), 192 mg (07/03/2019), 156 mg (07/10/2019), 156 mg (07/17/2019) fosaprepitant (EMEND) 150 mg, dexamethasone (DECADRON) 12 mg in sodium chloride 0.9 % 145 mL IVPB, , Intravenous,  Once, 4 of 4 cycles Administration:  (03/27/2019),  (04/10/2019),  (04/24/2019),  (05/08/2019)  for chemotherapy treatment.      CHIEF COMPLIANT: Cycle10Taxol (discontinuing chemo)  INTERVAL HISTORY: Kathleen Potter a 64y.o. 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 She is currently on adjuvant chemotherapywith weekly Taxol after completing 4 cycles ofdose dense Adriamycin and Cytoxan.She presents to the clinic  todayforcycle10and a toxicity check. The peripheral neuropathy appears to be getting markedly worse to the point that she has pain in her ankles as well as the entire bottom of both of her feet.  This is especially worse in her left leg.  She also has numbness in her hands.  She has not dropped any objects.  We decided not to continue further chemo.  ALLERGIES:  is allergic to latex.  MEDICATIONS:  Current Outpatient Medications  Medication Sig Dispense Refill  . ibuprofen (ADVIL) 800 MG tablet Take 1 tablet (800 mg total) by mouth every 8 (eight) hours as needed. 30 tablet 0  . lidocaine-prilocaine (EMLA) cream Apply to affected area once 30 g 3  . LORazepam (ATIVAN) 0.5 MG tablet Take 1 tablet (0.5 mg total) by mouth at bedtime as needed (Nausea or vomiting). 30 tablet 0  . omeprazole (PRILOSEC) 20 MG capsule Take one cap daily, 30 minutes before eating 15 capsule 1  . ondansetron (ZOFRAN) 8 MG tablet Take 1 tablet (8 mg total) by mouth 2 (two) times daily as needed. Start on the third day after chemotherapy. 30 tablet 1  . potassium chloride (KLOR-CON) 10 MEQ tablet Take 1 tablet (10 mEq total) by mouth daily. 30 tablet 0  . prochlorperazine (COMPAZINE) 10 MG tablet Take 1 tablet (10 mg total) by mouth every 6 (six) hours as needed (Nausea or vomiting). 30 tablet 1   No current facility-administered medications for this visit.    PHYSICAL EXAMINATION: ECOG PERFORMANCE STATUS: 2 - Symptomatic, <50% confined to bed  There were no vitals filed for this visit. There were no vitals filed for this visit.  LABORATORY DATA:  I have reviewed the data as listed CMP Latest Ref Rng & Units 07/17/2019 07/10/2019 07/03/2019  Glucose 70 - 99 mg/dL 113(H) 114(H) 112(H)  BUN 8 - 23 mg/dL '14 12 14  ' Creatinine 0.44 - 1.00 mg/dL 0.80 0.87 0.80  Sodium 135 - 145 mmol/L 144 145 144  Potassium 3.5 - 5.1 mmol/L 3.1(L) 3.2(L) 3.3(L)  Chloride 98 - 111 mmol/L 110 110 110  CO2 22 - 32 mmol/L '26 25 25    ' Calcium 8.9 - 10.3 mg/dL 8.5(L) 8.5(L) 8.6(L)  Total Protein 6.5 - 8.1 g/dL 6.2(L) 6.5 6.4(L)  Total Bilirubin 0.3 - 1.2 mg/dL 0.4 0.4 0.4  Alkaline Phos 38 - 126 U/L 62 66 62  AST 15 - 41 U/L 14(L) 14(L) 14(L)  ALT 0 - 44 U/L '18 17 16    ' Lab Results  Component Value Date   WBC 3.7 (L) 07/24/2019   HGB 9.8 (L) 07/24/2019   HCT 31.3 (L) 07/24/2019   MCV 78.1 (L) 07/24/2019   PLT 223 07/24/2019   NEUTROABS 2.5 07/24/2019    ASSESSMENT & PLAN:  Malignant neoplasm of upper-inner quadrant of right breast in female, estrogen receptor negative ([place]) 01/02/2019:Routine screening mammogram detected a 1.2cm mass in the right breast at the 12:30 location 7 cm from the nipple, no axillary adenopathy. Biopsy showed IDC, grade 2-3, HER-2 - (1+), ER -, PR -, Ki67 80%.  03/01/2019: Right lumpectomy: Grade 3 IDC, 1.4 cm, margins clear, 0/3 lymph nodes negative, triple negative stage IIb  Treatment plan: 1.Adjuvant chemotherapy with dose dense Adriamycin and Cytoxan x4 followed by Taxol weekly x12 2.Followed by radiation therapy  Patient enrolled in SWOG 1714 clinical trial -----------------------------------------------------------------------------------------------------------------------------------------------------  Current treatment:Completed 4 cycles of dose denseAdriamycin and Cytoxan.Today is cycle10Taxol Echocardiogram August 2020: EF 60 to 65%  Chemo toxicities: 1.Nausea:No further issues 2.fatigue:Moderate 3.Chemotherapy-induced anemia:Hb is 9.8 4.  Chemo-induced peripheral neuropathy: Grade 3.    We are discontinuing her chemotherapy today because of worsening neuropathy. 5.  Hypokalemia: Continue with oral potassium 10 mEq daily.  I sent a message to Dr. Brantley Stage to remove her port. Send a message to radiation oncology to see her for radiation. I will see her at the end of radiation.   No orders of the defined types were placed in this  encounter.  The patient has a good understanding of the overall plan. she agrees with it. she will call with any problems that may develop before the next visit here.  Total time spent: 30 mins including face to face time and time spent for planning, charting and coordination of care  Nicholas Lose, MD 07/24/2019  I, Cloyde Reams Dorshimer, am acting as scribe for Dr. Nicholas Lose.  I have reviewed the above documentation for accuracy and completeness, and I agree with the above.

## 2019-07-24 ENCOUNTER — Inpatient Hospital Stay: Payer: BC Managed Care – PPO

## 2019-07-24 ENCOUNTER — Inpatient Hospital Stay (HOSPITAL_BASED_OUTPATIENT_CLINIC_OR_DEPARTMENT_OTHER): Payer: BC Managed Care – PPO | Admitting: Hematology and Oncology

## 2019-07-24 ENCOUNTER — Inpatient Hospital Stay: Payer: BC Managed Care – PPO | Attending: Hematology and Oncology

## 2019-07-24 ENCOUNTER — Other Ambulatory Visit: Payer: Self-pay

## 2019-07-24 ENCOUNTER — Other Ambulatory Visit: Payer: Self-pay | Admitting: *Deleted

## 2019-07-24 DIAGNOSIS — Z171 Estrogen receptor negative status [ER-]: Secondary | ICD-10-CM | POA: Insufficient documentation

## 2019-07-24 DIAGNOSIS — C50211 Malignant neoplasm of upper-inner quadrant of right female breast: Secondary | ICD-10-CM

## 2019-07-24 DIAGNOSIS — D6481 Anemia due to antineoplastic chemotherapy: Secondary | ICD-10-CM | POA: Diagnosis not present

## 2019-07-24 DIAGNOSIS — E876 Hypokalemia: Secondary | ICD-10-CM | POA: Insufficient documentation

## 2019-07-24 DIAGNOSIS — R5383 Other fatigue: Secondary | ICD-10-CM | POA: Insufficient documentation

## 2019-07-24 DIAGNOSIS — G620 Drug-induced polyneuropathy: Secondary | ICD-10-CM | POA: Diagnosis not present

## 2019-07-24 LAB — CBC WITH DIFFERENTIAL (CANCER CENTER ONLY)
Abs Immature Granulocytes: 0.01 10*3/uL (ref 0.00–0.07)
Basophils Absolute: 0 10*3/uL (ref 0.0–0.1)
Basophils Relative: 0 %
Eosinophils Absolute: 0 10*3/uL (ref 0.0–0.5)
Eosinophils Relative: 1 %
HCT: 31.3 % — ABNORMAL LOW (ref 36.0–46.0)
Hemoglobin: 9.8 g/dL — ABNORMAL LOW (ref 12.0–15.0)
Immature Granulocytes: 0 %
Lymphocytes Relative: 24 %
Lymphs Abs: 0.9 10*3/uL (ref 0.7–4.0)
MCH: 24.4 pg — ABNORMAL LOW (ref 26.0–34.0)
MCHC: 31.3 g/dL (ref 30.0–36.0)
MCV: 78.1 fL — ABNORMAL LOW (ref 80.0–100.0)
Monocytes Absolute: 0.3 10*3/uL (ref 0.1–1.0)
Monocytes Relative: 9 %
Neutro Abs: 2.5 10*3/uL (ref 1.7–7.7)
Neutrophils Relative %: 66 %
Platelet Count: 223 10*3/uL (ref 150–400)
RBC: 4.01 MIL/uL (ref 3.87–5.11)
RDW: 18 % — ABNORMAL HIGH (ref 11.5–15.5)
WBC Count: 3.7 10*3/uL — ABNORMAL LOW (ref 4.0–10.5)
nRBC: 0.5 % — ABNORMAL HIGH (ref 0.0–0.2)

## 2019-07-24 LAB — CMP (CANCER CENTER ONLY)
ALT: 17 U/L (ref 0–44)
AST: 15 U/L (ref 15–41)
Albumin: 3.7 g/dL (ref 3.5–5.0)
Alkaline Phosphatase: 62 U/L (ref 38–126)
Anion gap: 10 (ref 5–15)
BUN: 10 mg/dL (ref 8–23)
CO2: 25 mmol/L (ref 22–32)
Calcium: 8.4 mg/dL — ABNORMAL LOW (ref 8.9–10.3)
Chloride: 108 mmol/L (ref 98–111)
Creatinine: 0.79 mg/dL (ref 0.44–1.00)
GFR, Est AFR Am: >60 mL/min (ref >60)
GFR, Estimated: >60 mL/min (ref >60)
Glucose, Bld: 115 mg/dL — ABNORMAL HIGH (ref 70–99)
Potassium: 3.1 mmol/L — ABNORMAL LOW (ref 3.5–5.1)
Sodium: 143 mmol/L (ref 135–145)
Total Bilirubin: 0.5 mg/dL (ref 0.3–1.2)
Total Protein: 6.5 g/dL (ref 6.5–8.1)

## 2019-07-24 NOTE — Assessment & Plan Note (Signed)
01/02/2019:Routine screening mammogram detected a 1.2cm mass in the right breast at the 12:30 location 7 cm from the nipple, no axillary adenopathy. Biopsy showed IDC, grade 2-3, HER-2 - (1+), ER -, PR -, Ki67 80%.  03/01/2019: Right lumpectomy: Grade 3 IDC, 1.4 cm, margins clear, 0/3 lymph nodes negative, triple negative stage IIb  Treatment plan: 1.Adjuvant chemotherapy with dose dense Adriamycin and Cytoxan x4 followed by Taxol weekly x12 2.Followed by radiation therapy  Patient enrolled in SWOG 1714 clinical trial ----------------------------------------------------------------------------------------------------------------------------------------------------- Current treatment:Completed 4 cycles of dose denseAdriamycin and Cytoxan.Today is cycle10Taxol Echocardiogram August 2020: EF 60 to 65%  Chemo toxicities: 1.Nausea:No further issues 2.fatigue:Moderate 3.Chemotherapy-induced anemia:Hb is 9.8 4.  Chemo-induced peripheral neuropathy: Grade 1- 2.    We reduced the dosage of Taxol. I discussed with her if her symptoms continue to get worse we can reduce her chemo even further or discontinue it if she starts dropping objects and has difficulty with buttoning her shirt etc. 5.  Hypokalemia: Instructed on potassium containing foods.  Return to clinicweekly for Taxol every other week for follow-up with me.

## 2019-07-26 ENCOUNTER — Encounter: Payer: Self-pay | Admitting: *Deleted

## 2019-07-28 ENCOUNTER — Ambulatory Visit: Payer: BC Managed Care – PPO

## 2019-07-31 ENCOUNTER — Other Ambulatory Visit: Payer: Self-pay

## 2019-07-31 ENCOUNTER — Ambulatory Visit: Payer: BC Managed Care – PPO

## 2019-07-31 ENCOUNTER — Inpatient Hospital Stay: Payer: BC Managed Care – PPO

## 2019-07-31 NOTE — Progress Notes (Unsigned)
Per Dr. Lindi Adie this patient is no longer receiving chemotherapy & does not need lab or flush appointments.

## 2019-08-07 ENCOUNTER — Encounter: Payer: Self-pay | Admitting: Radiation Oncology

## 2019-08-07 ENCOUNTER — Other Ambulatory Visit: Payer: BC Managed Care – PPO

## 2019-08-07 ENCOUNTER — Other Ambulatory Visit: Payer: Self-pay

## 2019-08-07 ENCOUNTER — Ambulatory Visit: Payer: BC Managed Care – PPO | Admitting: Hematology and Oncology

## 2019-08-07 ENCOUNTER — Ambulatory Visit: Payer: BC Managed Care – PPO

## 2019-08-07 ENCOUNTER — Encounter: Payer: Self-pay | Admitting: *Deleted

## 2019-08-08 ENCOUNTER — Other Ambulatory Visit: Payer: Self-pay | Admitting: *Deleted

## 2019-08-08 ENCOUNTER — Ambulatory Visit
Admission: RE | Admit: 2019-08-08 | Discharge: 2019-08-08 | Disposition: A | Discharge status: Home / Self Care | Payer: BC Managed Care – PPO | Source: Ambulatory Visit | Attending: Radiation Oncology | Admitting: Radiation Oncology

## 2019-08-08 ENCOUNTER — Telehealth: Payer: Self-pay

## 2019-08-08 ENCOUNTER — Other Ambulatory Visit: Payer: Self-pay

## 2019-08-08 DIAGNOSIS — C50211 Malignant neoplasm of upper-inner quadrant of right female breast: Secondary | ICD-10-CM

## 2019-08-08 DIAGNOSIS — Z510 Encounter for antineoplastic radiation therapy: Secondary | ICD-10-CM | POA: Diagnosis not present

## 2019-08-08 DIAGNOSIS — Z171 Estrogen receptor negative status [ER-]: Secondary | ICD-10-CM

## 2019-08-08 MED ORDER — GABAPENTIN 300 MG PO CAPS: 300.0000 mg | ORAL_CAPSULE | Freq: Every day | ORAL | 1 refills | Status: DC

## 2019-08-08 NOTE — Progress Notes (Signed)
Radiation Oncology         [phone] ________________________________  Outpatient Follow Up - Conducted via telephone due to current COVID-19 concerns for limiting patient exposure  I spoke with the patient to conduct this consult visit via telephone to spare the patient unnecessary potential exposure in the healthcare setting during the current COVID-19 pandemic. The patient was notified in advance and was offered a [place] meeting to allow for face to face communication but unfortunately reported that they did not have the appropriate resources/technology to support such a visit and instead preferred to proceed with a telephone visit.    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   HISTORY OF PRESENT ILLNESS: Kathleen Potter is a 64 y.o. female originally seen in the multidisciplinary breast clinic for a new diagnosis of right breast cancer. The patient was noted to have a screening detected abnormality in the posterior right breast.  Diagnostic imaging revealed this at the 12:30 position measuring 1.2 x 1.2 x 1.1 cm, and her right axilla was negative for adenopathy.  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  She underwent lumpectomy with sentinel node biopsy on 03/01/2019 which revealed a grade 3, 1.4 cm invasive ductal carcinoma with negative margins, and 3 sampled nodes were also negative for disease.  She began systemic chemotherapy on 03/27/2019, she tolerated chemotherapy pretty well until her ninth cycle of Taxol, she had noticed progressive neuropathy and chemotherapy was discontinued.  Her last infusion was on  07/17/2019.  She is contacted today to discuss adjuvant radiotherapy now that she is finished with systemic treatment.   PREVIOUS RADIATION THERAPY: No   PAST MEDICAL HISTORY:  Past Medical History:  Diagnosis Date   Family history of breast cancer    Family history of prostate cancer    Hypertension    Pt states her BP has been running high lately.       PAST SURGICAL HISTORY: Past Surgical History:  Procedure Laterality Date   BREAST BIOPSY Right    BREAST LUMPECTOMY WITH RADIOACTIVE SEED AND SENTINEL LYMPH NODE BIOPSY Right 03/01/2019   Procedure: RIGHT BREAST LUMPECTOMY WITH RADIOACTIVE SEED;  Surgeon: Erroll Luna, MD;  Location: [place];  Service: General;  Laterality: Right;   PARTIAL HYSTERECTOMY     PORTACATH PLACEMENT N/A 03/01/2019   Procedure: INSERTION PORT-A-CATH WITH ULTRASOUND;  Surgeon: Erroll Luna, MD;  Location: [place];  Service: General;  Laterality: N/A;   SENTINEL NODE BIOPSY Right 03/01/2019   Procedure: Right Sentinel Lymph Node Biopsy;  Surgeon: Erroll Luna, MD;  Location: [place];  Service: General;  Laterality: Right;     FAMILY HISTORY:  Family History  Problem Relation Age of Onset   Breast cancer Sister 82   Prostate cancer Paternal Uncle        dx. early-mid 70s, not metastatic     SOCIAL HISTORY:  reports that she has never smoked. She has never used smokeless tobacco. She reports previous alcohol use. She reports that she does not use drugs. The patient is single and lives in [place]. 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 She's originally from [place].    ALLERGIES: Latex   MEDICATIONS:  Current Outpatient Medications  Medication Sig Dispense Refill   lidocaine-prilocaine (EMLA) cream Apply to affected area once 30 g 3   omeprazole (PRILOSEC) 20 MG capsule Take one cap daily, 30 minutes before eating 15 capsule  1   potassium chloride (KLOR-CON) 10 MEQ tablet Take 1 tablet (10 mEq total) by mouth daily. 30 tablet 0   ibuprofen (ADVIL) 800 MG tablet Take 1 tablet (800 mg total) by mouth every 8 (eight) hours as needed. (Patient not taking: Reported on 08/07/2019) 30 tablet 0   LORazepam (ATIVAN) 0.5 MG tablet Take 1 tablet (0.5 mg total) by mouth at bedtime as needed (Nausea or vomiting). (Patient not taking: Reported on 08/07/2019) 30 tablet 0   No current facility-administered medications for this encounter.     REVIEW OF SYSTEMS: On review of systems, the patient reports that she is doing well overall. Her main concern is her chemotherapy induced peripheral neuropathy that has become worse in her feet and lower extremities, but improved in her upper extremities. She denies any chest pain, shortness of breath, cough, fevers, chills, night sweats, unintended weight changes. She denies any bowel or bladder disturbances, and denies abdominal pain, nausea or vomiting. She denies any new musculoskeletal or joint aches or pains. A complete review of systems is obtained and is otherwise negative.     PHYSICAL EXAM:  Unable to assess due to encounter type.  ECOG = 1  0 - Asymptomatic (Fully active, able to carry on all predisease activities without restriction)  1 - Symptomatic but completely ambulatory (Restricted in physically strenuous activity but ambulatory and able to carry out work of a light or sedentary nature. For example, light housework, office work)  2 - Symptomatic, <50% in bed during the day (Ambulatory and capable of all self care but unable to carry out any work activities. Up and about more than 50% of waking hours)  3 - Symptomatic, >50% in bed, but not bedbound (Capable of only limited self-care, confined to bed or chair 50% or more of waking hours)  4 - Bedbound (Completely disabled. Cannot carry on any self-care. Totally confined to bed or chair)  5 - Death   Eustace Pen MM, Creech RH,  Tormey DC, et al. [phone]). "Toxicity and response criteria of the [facility]". [place] Oncol. 5 (6): 649-55    LABORATORY DATA:  Lab Results  Component Value Date   WBC 3.7 (L) 07/24/2019   HGB 9.8 (L) 07/24/2019   HCT 31.3 (L) 07/24/2019   MCV 78.1 (L) 07/24/2019   PLT 223 07/24/2019   Lab Results  Component Value Date   NA 143 07/24/2019   K 3.1 (L) 07/24/2019   CL 108 07/24/2019   CO2 25 07/24/2019   Lab Results  Component Value Date   ALT 17 07/24/2019   AST 15 07/24/2019   ALKPHOS 62 07/24/2019   BILITOT 0.5 07/24/2019      RADIOGRAPHY: No results found.     IMPRESSION/PLAN: 1. Stage IB, pT1cN0M0 grade 3, triple negative invasive ductal carcinoma of the right breast. Dr. Lisbeth Renshaw discusses the final results of pathology and reviews that she would benefit from adjuvant radiotherapy to the breast to reduce the risk of local recurrence.  We discussed the risks, benefits, short, and long term effects of radiotherapy, and the patient is interested  in proceeding. Dr. Lisbeth Renshaw discusses the delivery and logistics of radiotherapy and anticipates a course of 4 weeks of radiotherapy to the right breast over 4 weeks. She will come in today for simulation and proceed in about two weeks. 2. Peripheral neuropathy secondary to chemotherapy. The patient has been having progressive symptoms especially in her lower extremities. I suggested she try Vitamin B6 and I suggested she touch base with Dr. Lindi Adie. I've also reached out to his nurse Merleen Nicely to keep them informed as well.   Given current concerns for patient exposure during the COVID-19 pandemic, this encounter was conducted via telephone.  The patient has provided two factor identification and has given verbal consent for this type of encounter and has been advised to only accept a meeting of this type in a secure network environment. The time spent during this encounter was 30 minutes including preparation,  discussion, and coordination of the patient's care. The attendants for this meeting include  Dr. Lisbeth Renshaw, Hayden Pedro  and Joycelyn Schmid.  During the encounter,  Dr. Lisbeth Renshaw, and Hayden Pedro were located at [facility] Radiation Oncology Department.  Jamara Vary was located at home.    The above documentation reflects my direct findings during this shared patient visit. Please see the separate note by Dr. Lisbeth Renshaw on this date for the remainder of the patient's plan of care.    Carola Rhine, PAC

## 2019-08-08 NOTE — Telephone Encounter (Signed)
Reminded patient of telephone encounter for today. Patient  C/o numbness and tingling in her feet that extends to her toes. She stated the bottom of her feet hurt. Advised patient to contact her primary care physician.

## 2019-08-08 NOTE — Progress Notes (Signed)
Spoke with patient about follow up appointment today she stated her neuropathy has worsened in her feet she has numbness and tingling noted down to her toes.She is unable to feel the bottom of her feet. Advised patient to contact primary care physician.

## 2019-08-08 NOTE — Progress Notes (Signed)
Pt reporting neuropathy worsening in her feet.  Pt states she is experiencing constant numbness and tingling in her toes.  Pt states she has difficulty walking at times and stumbles due to the neuropathy. Per MD pt to start taking Gabapentin 300 mg p.o daily at bedtime.  Pt educated on the medication and verbalized understanding.

## 2019-08-14 ENCOUNTER — Encounter: Payer: Self-pay | Admitting: *Deleted

## 2019-08-15 ENCOUNTER — Telehealth: Payer: Self-pay | Admitting: Hematology and Oncology

## 2019-08-15 ENCOUNTER — Other Ambulatory Visit: Payer: Self-pay

## 2019-08-15 ENCOUNTER — Ambulatory Visit
Admission: RE | Admit: 2019-08-15 | Discharge: 2019-08-15 | Disposition: A | Discharge status: Home / Self Care | Payer: BC Managed Care – PPO | Source: Ambulatory Visit | Attending: Radiation Oncology | Admitting: Radiation Oncology

## 2019-08-15 DIAGNOSIS — Z510 Encounter for antineoplastic radiation therapy: Secondary | ICD-10-CM | POA: Diagnosis not present

## 2019-08-15 DIAGNOSIS — C50211 Malignant neoplasm of upper-inner quadrant of right female breast: Secondary | ICD-10-CM

## 2019-08-15 MED ORDER — ALRA NON-METALLIC DEODORANT (RAD-ONC)
1.0000 "application " | Freq: Once | TOPICAL | Status: AC
  Administered 2019-08-15: 1 via TOPICAL

## 2019-08-15 MED ORDER — SONAFINE EX EMUL
1.0000 "application " | Freq: Once | CUTANEOUS | Status: AC
  Administered 2019-08-15: 1 via TOPICAL

## 2019-08-15 NOTE — Progress Notes (Signed)
Pt here for patient teaching.  Pt given Radiation and You booklet, skin care instructions, Alra deodorant and Sonafine.  Reviewed areas of pertinence such as fatigue, hair loss, skin changes, breast tenderness and breast swelling . Pt able to give teach back of to pat skin and use unscented/gentle soap,apply Sonafine bid, avoid applying anything to skin within 4 hours of treatment, avoid wearing an under wire bra and to use an electric razor if they must shave. Pt verbalizes understanding of information given and will contact nursing with any questions or concerns.     Londyn Hotard M. Demarquis Osley RN, BSN             

## 2019-08-15 NOTE — Telephone Encounter (Signed)
Scheduled appt per 3/23 sch message - mailed reminder letter with appt date and time

## 2019-08-16 ENCOUNTER — Other Ambulatory Visit: Payer: Self-pay

## 2019-08-16 ENCOUNTER — Ambulatory Visit
Admission: RE | Admit: 2019-08-16 | Discharge: 2019-08-16 | Disposition: A | Discharge status: Home / Self Care | Payer: BC Managed Care – PPO | Source: Ambulatory Visit | Attending: Radiation Oncology | Admitting: Radiation Oncology

## 2019-08-16 DIAGNOSIS — Z510 Encounter for antineoplastic radiation therapy: Secondary | ICD-10-CM | POA: Diagnosis not present

## 2019-08-17 ENCOUNTER — Other Ambulatory Visit: Payer: Self-pay

## 2019-08-17 ENCOUNTER — Ambulatory Visit
Admission: RE | Admit: 2019-08-17 | Discharge: 2019-08-17 | Disposition: A | Discharge status: Home / Self Care | Payer: BC Managed Care – PPO | Source: Ambulatory Visit | Attending: Radiation Oncology | Admitting: Radiation Oncology

## 2019-08-17 DIAGNOSIS — Z510 Encounter for antineoplastic radiation therapy: Secondary | ICD-10-CM | POA: Diagnosis not present

## 2019-08-20 ENCOUNTER — Ambulatory Visit
Admission: RE | Admit: 2019-08-20 | Discharge: 2019-08-20 | Disposition: A | Discharge status: Home / Self Care | Payer: BC Managed Care – PPO | Source: Ambulatory Visit | Attending: Radiation Oncology | Admitting: Radiation Oncology

## 2019-08-20 ENCOUNTER — Other Ambulatory Visit: Payer: Self-pay

## 2019-08-20 DIAGNOSIS — Z510 Encounter for antineoplastic radiation therapy: Secondary | ICD-10-CM | POA: Diagnosis not present

## 2019-08-21 ENCOUNTER — Other Ambulatory Visit: Payer: Self-pay

## 2019-08-21 ENCOUNTER — Ambulatory Visit
Admission: RE | Admit: 2019-08-21 | Discharge: 2019-08-21 | Disposition: A | Discharge status: Home / Self Care | Payer: BC Managed Care – PPO | Source: Ambulatory Visit | Attending: Radiation Oncology | Admitting: Radiation Oncology

## 2019-08-21 DIAGNOSIS — Z510 Encounter for antineoplastic radiation therapy: Secondary | ICD-10-CM | POA: Diagnosis not present

## 2019-08-22 ENCOUNTER — Ambulatory Visit
Admission: RE | Admit: 2019-08-22 | Discharge: 2019-08-22 | Disposition: A | Discharge status: Home / Self Care | Payer: BC Managed Care – PPO | Source: Ambulatory Visit | Attending: Radiation Oncology | Admitting: Radiation Oncology

## 2019-08-22 ENCOUNTER — Other Ambulatory Visit: Payer: Self-pay

## 2019-08-22 DIAGNOSIS — Z510 Encounter for antineoplastic radiation therapy: Secondary | ICD-10-CM | POA: Diagnosis not present

## 2019-08-23 ENCOUNTER — Encounter: Payer: Self-pay | Admitting: *Deleted

## 2019-08-23 ENCOUNTER — Ambulatory Visit
Admission: RE | Admit: 2019-08-23 | Discharge: 2019-08-23 | Disposition: A | Discharge status: Home / Self Care | Payer: BC Managed Care – PPO | Source: Ambulatory Visit | Attending: Radiation Oncology | Admitting: Radiation Oncology

## 2019-08-23 ENCOUNTER — Other Ambulatory Visit: Payer: Self-pay

## 2019-08-23 DIAGNOSIS — Z510 Encounter for antineoplastic radiation therapy: Secondary | ICD-10-CM | POA: Insufficient documentation

## 2019-08-23 DIAGNOSIS — C50211 Malignant neoplasm of upper-inner quadrant of right female breast: Secondary | ICD-10-CM | POA: Diagnosis present

## 2019-08-24 ENCOUNTER — Ambulatory Visit
Admission: RE | Admit: 2019-08-24 | Discharge: 2019-08-24 | Disposition: A | Discharge status: Home / Self Care | Payer: BC Managed Care – PPO | Source: Ambulatory Visit | Attending: Radiation Oncology | Admitting: Radiation Oncology

## 2019-08-24 ENCOUNTER — Other Ambulatory Visit: Payer: Self-pay

## 2019-08-24 DIAGNOSIS — Z510 Encounter for antineoplastic radiation therapy: Secondary | ICD-10-CM | POA: Diagnosis not present

## 2019-08-27 ENCOUNTER — Ambulatory Visit
Admission: RE | Admit: 2019-08-27 | Discharge: 2019-08-27 | Disposition: A | Discharge status: Home / Self Care | Payer: BC Managed Care – PPO | Source: Ambulatory Visit | Attending: Radiation Oncology | Admitting: Radiation Oncology

## 2019-08-27 ENCOUNTER — Other Ambulatory Visit: Payer: Self-pay

## 2019-08-27 DIAGNOSIS — Z510 Encounter for antineoplastic radiation therapy: Secondary | ICD-10-CM | POA: Diagnosis not present

## 2019-08-28 ENCOUNTER — Ambulatory Visit
Admission: RE | Admit: 2019-08-28 | Discharge: 2019-08-28 | Disposition: A | Discharge status: Home / Self Care | Payer: BC Managed Care – PPO | Source: Ambulatory Visit | Attending: Radiation Oncology | Admitting: Radiation Oncology

## 2019-08-28 ENCOUNTER — Other Ambulatory Visit: Payer: Self-pay

## 2019-08-28 DIAGNOSIS — Z510 Encounter for antineoplastic radiation therapy: Secondary | ICD-10-CM | POA: Diagnosis not present

## 2019-08-29 ENCOUNTER — Other Ambulatory Visit: Payer: Self-pay

## 2019-08-29 ENCOUNTER — Ambulatory Visit
Admission: RE | Admit: 2019-08-29 | Discharge: 2019-08-29 | Disposition: A | Discharge status: Home / Self Care | Payer: BC Managed Care – PPO | Source: Ambulatory Visit | Attending: Radiation Oncology | Admitting: Radiation Oncology

## 2019-08-29 DIAGNOSIS — Z510 Encounter for antineoplastic radiation therapy: Secondary | ICD-10-CM | POA: Diagnosis not present

## 2019-08-30 ENCOUNTER — Ambulatory Visit
Admission: RE | Admit: 2019-08-30 | Discharge: 2019-08-30 | Disposition: A | Discharge status: Home / Self Care | Payer: BC Managed Care – PPO | Source: Ambulatory Visit | Attending: Radiation Oncology | Admitting: Radiation Oncology

## 2019-08-30 ENCOUNTER — Other Ambulatory Visit: Payer: Self-pay

## 2019-08-30 DIAGNOSIS — Z510 Encounter for antineoplastic radiation therapy: Secondary | ICD-10-CM | POA: Diagnosis not present

## 2019-08-31 ENCOUNTER — Other Ambulatory Visit: Payer: Self-pay

## 2019-08-31 ENCOUNTER — Ambulatory Visit: Payer: BC Managed Care – PPO | Admitting: Radiation Oncology

## 2019-08-31 ENCOUNTER — Ambulatory Visit
Admission: RE | Admit: 2019-08-31 | Discharge: 2019-08-31 | Disposition: A | Discharge status: Home / Self Care | Payer: BC Managed Care – PPO | Source: Ambulatory Visit | Attending: Radiation Oncology | Admitting: Radiation Oncology

## 2019-08-31 DIAGNOSIS — Z510 Encounter for antineoplastic radiation therapy: Secondary | ICD-10-CM | POA: Diagnosis not present

## 2019-09-03 ENCOUNTER — Other Ambulatory Visit: Payer: Self-pay

## 2019-09-03 ENCOUNTER — Ambulatory Visit
Admission: RE | Admit: 2019-09-03 | Discharge: 2019-09-03 | Disposition: A | Discharge status: Home / Self Care | Payer: BC Managed Care – PPO | Source: Ambulatory Visit | Attending: Radiation Oncology | Admitting: Radiation Oncology

## 2019-09-03 DIAGNOSIS — Z510 Encounter for antineoplastic radiation therapy: Secondary | ICD-10-CM | POA: Diagnosis not present

## 2019-09-04 ENCOUNTER — Ambulatory Visit
Admission: RE | Admit: 2019-09-04 | Discharge: 2019-09-04 | Disposition: A | Discharge status: Home / Self Care | Payer: BC Managed Care – PPO | Source: Ambulatory Visit | Attending: Radiation Oncology | Admitting: Radiation Oncology

## 2019-09-04 ENCOUNTER — Other Ambulatory Visit: Payer: Self-pay

## 2019-09-04 DIAGNOSIS — Z510 Encounter for antineoplastic radiation therapy: Secondary | ICD-10-CM | POA: Diagnosis not present

## 2019-09-05 ENCOUNTER — Other Ambulatory Visit: Payer: Self-pay

## 2019-09-05 ENCOUNTER — Ambulatory Visit
Admission: RE | Admit: 2019-09-05 | Discharge: 2019-09-05 | Disposition: A | Discharge status: Home / Self Care | Payer: BC Managed Care – PPO | Source: Ambulatory Visit | Attending: Radiation Oncology | Admitting: Radiation Oncology

## 2019-09-05 ENCOUNTER — Other Ambulatory Visit: Payer: Self-pay | Admitting: Hematology and Oncology

## 2019-09-05 DIAGNOSIS — Z510 Encounter for antineoplastic radiation therapy: Secondary | ICD-10-CM | POA: Diagnosis not present

## 2019-09-06 ENCOUNTER — Other Ambulatory Visit: Payer: Self-pay

## 2019-09-06 ENCOUNTER — Ambulatory Visit
Admission: RE | Admit: 2019-09-06 | Discharge: 2019-09-06 | Disposition: A | Discharge status: Home / Self Care | Payer: BC Managed Care – PPO | Source: Ambulatory Visit | Attending: Radiation Oncology | Admitting: Radiation Oncology

## 2019-09-06 DIAGNOSIS — Z510 Encounter for antineoplastic radiation therapy: Secondary | ICD-10-CM | POA: Diagnosis not present

## 2019-09-07 ENCOUNTER — Ambulatory Visit
Admission: RE | Admit: 2019-09-07 | Discharge: 2019-09-07 | Disposition: A | Discharge status: Home / Self Care | Payer: BC Managed Care – PPO | Source: Ambulatory Visit | Attending: Radiation Oncology | Admitting: Radiation Oncology

## 2019-09-07 DIAGNOSIS — Z510 Encounter for antineoplastic radiation therapy: Secondary | ICD-10-CM | POA: Diagnosis not present

## 2019-09-09 NOTE — Progress Notes (Signed)
Patient Care Team: Rankins, Bill Salinas, MD as PCP - General (Family Medicine) Rockwell Germany, RN as Oncology Nurse Navigator Tressie Ellis, Paulette Blanch, RN as Oncology Nurse Navigator Erroll Luna, MD as Consulting Physician (General Surgery) Nicholas Lose, MD as Consulting Physician (Hematology and Oncology) Kyung Rudd, MD as Consulting Physician (Radiation Oncology)  DIAGNOSIS:    ICD-10-CM   1. Malignant neoplasm of upper-inner quadrant of right breast in female, estrogen receptor negative ([place])  C50.211    Z17.1     SUMMARY OF ONCOLOGIC HISTORY: Oncology History  Malignant neoplasm of upper-inner quadrant of right breast in female, estrogen receptor negative ([place])  01/02/2019 Initial Diagnosis   Routine screening mammogram detected a 1.2cm mass in the right breast at the 12:30 location 7 cm from the nipple, no axillary adenopathy. Biopsy showed IDC, grade 2-3, HER-2 - (1+), ER -, PR -, Ki67 80%.    01/03/2019 Cancer Staging   Staging form: Breast, AJCC 8th Edition - Clinical stage from 01/03/2019: Stage IB (cT1c, cN0, cM0, G3, ER-, PR-, HER2-) - Signed by Nicholas Lose, MD on 01/03/2019   03/01/2019 Surgery   Lumpectomy ([place]): IDC, grade 3, 1.4cm, clear margins, 3 lymph nodes negative for carcinoma.    03/09/2019 Cancer Staging   Staging form: Breast, AJCC 8th Edition - Pathologic stage from 03/09/2019: Stage IB (pT1c, pN0, cM0, G3, ER-, PR-, HER2-) - Signed by Nicholas Lose, MD on 03/09/2019   03/27/2019 -  Chemotherapy   The patient had DOXOrubicin (ADRIAMYCIN) chemo injection 118 mg, 50 mg/m2 = 118 mg (83.3 % of original dose 60 mg/m2), Intravenous,  Once, 4 of 4 cycles Dose modification: 50 mg/m2 (original dose 60 mg/m2, Cycle 1, Reason: Provider Judgment), 45 mg/m2 (original dose 60 mg/m2, Cycle 3, Reason: Provider Judgment) Administration: 118 mg (03/27/2019), 118 mg (04/10/2019), 106 mg (04/24/2019), 106 mg (05/08/2019) palonosetron (ALOXI) injection 0.25 mg, 0.25 mg,  Intravenous,  Once, 12 of 15 cycles Administration: 0.25 mg (03/27/2019), 0.25 mg (04/10/2019), 0.25 mg (04/24/2019), 0.25 mg (05/08/2019), 0.25 mg (05/29/2019), 0.25 mg (06/06/2019), 0.25 mg (06/12/2019), 0.25 mg (06/19/2019), 0.25 mg (06/26/2019), 0.25 mg (07/03/2019), 0.25 mg (07/10/2019), 0.25 mg (07/17/2019) pegfilgrastim-jmdb (FULPHILA) injection 6 mg, 6 mg, Subcutaneous,  Once, 4 of 4 cycles Administration: 6 mg (03/29/2019), 6 mg (04/12/2019), 6 mg (04/26/2019), 6 mg (05/10/2019) cyclophosphamide (CYTOXAN) 1,180 mg in sodium chloride 0.9 % 250 mL chemo infusion, 500 mg/m2 = 1,180 mg (83.3 % of original dose 600 mg/m2), Intravenous,  Once, 4 of 4 cycles Dose modification: 500 mg/m2 (original dose 600 mg/m2, Cycle 1, Reason: Provider Judgment), 450 mg/m2 (original dose 600 mg/m2, Cycle 3, Reason: Provider Judgment) Administration: 1,180 mg (03/27/2019), 1,180 mg (04/10/2019), 1,060 mg (04/24/2019), 1,060 mg (05/08/2019) PACLitaxel (TAXOL) 192 mg in sodium chloride 0.9 % 250 mL chemo infusion (</= 25m/m2), 80 mg/m2 = 192 mg, Intravenous,  Once, 9 of 12 cycles Dose modification: 65 mg/m2 (original dose 80 mg/m2, Cycle 12, Reason: Dose not tolerated) Administration: 192 mg (05/22/2019), 192 mg (05/29/2019), 192 mg (06/06/2019), 192 mg (06/12/2019), 192 mg (06/19/2019), 192 mg (06/26/2019), 192 mg (07/03/2019), 156 mg (07/10/2019), 156 mg (07/17/2019) fosaprepitant (EMEND) 150 mg, dexamethasone (DECADRON) 12 mg in sodium chloride 0.9 % 145 mL IVPB, , Intravenous,  Once, 4 of 4 cycles Administration:  (03/27/2019),  (04/10/2019),  (04/24/2019),  (05/08/2019)  for chemotherapy treatment.    08/16/2019 -  Radiation Therapy   Adjuvant radiation     CHIEF COMPLIANT: Follow-up to discuss further treatment   INTERVAL HISTORY: Kathleen Potter is a 64 y.o. with above-mentioned history of triple negative right breast cancer who underwent a lumpectomy, adjuvant chemotherapy, and is currently on radiation treatment.She presents  to the clinic todayto discuss further treatment.   ALLERGIES:  is allergic to latex.  MEDICATIONS:  Current Outpatient Medications  Medication Sig Dispense Refill  . gabapentin (NEURONTIN) 300 MG capsule Take 1 capsule (300 mg total) by mouth at bedtime. 30 capsule 1  . ibuprofen (ADVIL) 800 MG tablet Take 1 tablet (800 mg total) by mouth every 8 (eight) hours as needed. (Patient not taking: Reported on 08/07/2019) 30 tablet 0  . lidocaine-prilocaine (EMLA) cream Apply to affected area once 30 g 3  . LORazepam (ATIVAN) 0.5 MG tablet Take 1 tablet (0.5 mg total) by mouth at bedtime as needed (Nausea or vomiting). (Patient not taking: Reported on 08/07/2019) 30 tablet 0  . omeprazole (PRILOSEC) 20 MG capsule Take one cap daily, 30 minutes before eating 15 capsule 1  . potassium chloride (KLOR-CON) 10 MEQ tablet TAKE 1 TABLET BY MOUTH EVERY DAY 30 tablet 0   No current facility-administered medications for this visit.    PHYSICAL EXAMINATION: ECOG PERFORMANCE STATUS: 1 - Symptomatic but completely ambulatory  Vitals:   09/10/19 1519  BP: 139/79  Pulse: 78  Resp: 18  Temp: 98.7 F (37.1 C)  SpO2: 99%   Filed Weights   09/10/19 1519  Weight: 242 lb 4.8 oz (109.9 kg)       LABORATORY DATA:  I have reviewed the data as listed CMP Latest Ref Rng & Units 07/24/2019 07/17/2019 07/10/2019  Glucose 70 - 99 mg/dL 115(H) 113(H) 114(H)  BUN 8 - 23 mg/dL '10 14 12  ' Creatinine 0.44 - 1.00 mg/dL 0.79 0.80 0.87  Sodium 135 - 145 mmol/L 143 144 145  Potassium 3.5 - 5.1 mmol/L 3.1(L) 3.1(L) 3.2(L)  Chloride 98 - 111 mmol/L 108 110 110  CO2 22 - 32 mmol/L '25 26 25  ' Calcium 8.9 - 10.3 mg/dL 8.4(L) 8.5(L) 8.5(L)  Total Protein 6.5 - 8.1 g/dL 6.5 6.2(L) 6.5  Total Bilirubin 0.3 - 1.2 mg/dL 0.5 0.4 0.4  Alkaline Phos 38 - 126 U/L 62 62 66  AST 15 - 41 U/L 15 14(L) 14(L)  ALT 0 - 44 U/L '17 18 17    ' Lab Results  Component Value Date   WBC 3.7 (L) 07/24/2019   HGB 9.8 (L) 07/24/2019   HCT 31.3  (L) 07/24/2019   MCV 78.1 (L) 07/24/2019   PLT 223 07/24/2019   NEUTROABS 2.5 07/24/2019    ASSESSMENT & PLAN:  Malignant neoplasm of upper-inner quadrant of right breast in female, estrogen receptor negative ([place]) 01/02/2019:Routine screening mammogram detected a 1.2cm mass in the right breast at the 12:30 location 7 cm from the nipple, no axillary adenopathy. Biopsy showed IDC, grade 2-3, HER-2 - (1+), ER -, PR -, Ki67 80%.  03/01/2019: Right lumpectomy: Grade 3 IDC, 1.4 cm, margins clear, 0/3 lymph nodes negative, triple negative stage IIb  Treatment plan: 1.Adjuvant chemotherapy with dose dense Adriamycin and Cytoxan x4 followed by Taxol weekly x9 (completed February 2021) 2.Followed by radiation therapy completed 09/11/2019  Patient enrolled in [place] 1714 clinical trial ----------------------------------------------------------------------------------------------------------------------------------------------------- Patient completed radiation therapy and has done extremely well. Surveillance: Breast exams and mammograms. There is no role of antiestrogen therapy because she is ER/PR negative.  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  I encouraged her to exercise and walk regularly.  The neuropathy symptoms are getting better with  time.  Return to clinic in 3 months for survivorship care plan visit.    No orders of the defined types were placed in this encounter.  The patient has a good understanding of the overall plan. she agrees with it. she will call with any problems that may develop before the next visit here.  Total time spent: 20 mins including face to face time and time spent for planning, charting and coordination of care  Nicholas Lose, MD 09/10/2019  I, Cloyde Reams Dorshimer, am acting as scribe for Dr. Nicholas Lose.  I have reviewed the above documentation for accuracy and completeness, and I agree with the above.

## 2019-09-10 ENCOUNTER — Ambulatory Visit
Admission: RE | Admit: 2019-09-10 | Discharge: 2019-09-10 | Disposition: A | Discharge status: Home / Self Care | Payer: BC Managed Care – PPO | Source: Ambulatory Visit | Attending: Radiation Oncology | Admitting: Radiation Oncology

## 2019-09-10 ENCOUNTER — Inpatient Hospital Stay: Payer: BC Managed Care – PPO | Attending: Hematology and Oncology | Admitting: Hematology and Oncology

## 2019-09-10 ENCOUNTER — Other Ambulatory Visit: Payer: Self-pay

## 2019-09-10 ENCOUNTER — Encounter: Payer: Self-pay | Admitting: Hematology and Oncology

## 2019-09-10 ENCOUNTER — Encounter: Payer: Self-pay | Admitting: *Deleted

## 2019-09-10 DIAGNOSIS — Z171 Estrogen receptor negative status [ER-]: Secondary | ICD-10-CM | POA: Insufficient documentation

## 2019-09-10 DIAGNOSIS — C50211 Malignant neoplasm of upper-inner quadrant of right female breast: Secondary | ICD-10-CM | POA: Diagnosis present

## 2019-09-10 DIAGNOSIS — G620 Drug-induced polyneuropathy: Secondary | ICD-10-CM | POA: Insufficient documentation

## 2019-09-10 DIAGNOSIS — Z510 Encounter for antineoplastic radiation therapy: Secondary | ICD-10-CM | POA: Diagnosis not present

## 2019-09-10 NOTE — Assessment & Plan Note (Signed)
01/02/2019:Routine screening mammogram detected a 1.2cm mass in the right breast at the 12:30 location 7 cm from the nipple, no axillary adenopathy. Biopsy showed IDC, grade 2-3, HER-2 - (1+), ER -, PR -, Ki67 80%.  03/01/2019: Right lumpectomy: Grade 3 IDC, 1.4 cm, margins clear, 0/3 lymph nodes negative, triple negative stage IIb  Treatment plan: 1.Adjuvant chemotherapy with dose dense Adriamycin and Cytoxan x4 followed by Taxol weekly x12 2.Followed by radiation therapy  Patient enrolled in [place] 1714 clinical trial ----------------------------------------------------------------------------------------------------------------------------------------------------- Current treatment:Completed 4 cycles of dose denseAdriamycin and Cytoxan.Today is cycle12Taxol Echocardiogram August 2020: EF 60 to 65%  Chemo toxicities: 1.Nausea:No further issues 2.fatigue:Moderate 3.Chemotherapy-induced anemia:Hbis 9.8 4.Chemo-induced peripheral neuropathy: Grade 3.   We are discontinuing her chemotherapy today because of worsening neuropathy. 5.Hypokalemia: Continue with oral potassium 10 mEq daily.  This concludes her chemotherapy treatment. Follow-up back with [place] after radiation is complete.

## 2019-09-11 ENCOUNTER — Ambulatory Visit
Admission: RE | Admit: 2019-09-11 | Discharge: 2019-09-11 | Disposition: A | Discharge status: Home / Self Care | Payer: BC Managed Care – PPO | Source: Ambulatory Visit | Attending: Radiation Oncology | Admitting: Radiation Oncology

## 2019-09-11 ENCOUNTER — Other Ambulatory Visit: Payer: Self-pay

## 2019-09-11 ENCOUNTER — Encounter: Payer: Self-pay | Admitting: Radiation Oncology

## 2019-09-11 ENCOUNTER — Telehealth: Payer: Self-pay | Admitting: Adult Health

## 2019-09-11 DIAGNOSIS — Z510 Encounter for antineoplastic radiation therapy: Secondary | ICD-10-CM | POA: Diagnosis not present

## 2019-09-11 NOTE — Telephone Encounter (Signed)
Scheduled per los, patient has been called and notified. 

## 2019-09-18 ENCOUNTER — Other Ambulatory Visit: Payer: BC Managed Care – PPO

## 2019-10-05 ENCOUNTER — Telehealth: Payer: Self-pay | Admitting: *Deleted

## 2019-10-05 NOTE — Telephone Encounter (Signed)
Received call from pt stating she has been experiencing an increase in bilateral ankle/foot edema for the last few weeks.  Pt states the swelling indents when she presses down on it and is worse towards the end of the day.  Pt also reports that swelling does not completely disappear with rest and elevation. Pt states she watches her diet closely and limits high sodium foods. Pt states the swelling mixed with her neuropathy is causing her to experience difficulty with walking.  RN will review with MD for further recommendations.

## 2019-10-06 ENCOUNTER — Other Ambulatory Visit: Payer: Self-pay | Admitting: Hematology and Oncology

## 2019-10-08 ENCOUNTER — Telehealth: Payer: Self-pay | Admitting: Radiation Oncology

## 2019-10-08 ENCOUNTER — Other Ambulatory Visit: Payer: Self-pay | Admitting: *Deleted

## 2019-10-08 DIAGNOSIS — C50211 Malignant neoplasm of upper-inner quadrant of right female breast: Secondary | ICD-10-CM

## 2019-10-08 NOTE — Progress Notes (Signed)
Per MD pt to receive echocardiogram and bilateral lower extremity Vas [place] to r/o DVT for increase in lower extremity edema.  Orders placed and apt scheduled.  Pt notified and verbalized understanding of apt date and time.

## 2019-10-08 NOTE — Telephone Encounter (Signed)
  Radiation Oncology         [phone]) [phone] ________________________________  Name: Kathleen Potter MRN: [id number]  Date of Service: 10/10/19       DOB: 01-Aug-1955  Post Treatment Telephone Note  Diagnosis:  Stage IB, pT1cN0M0 grade 3, triple negative invasive ductal carcinoma of the right breast.  Interval Since Last Radiation:  4 weeks   08/15/19-09/11/19: The right breast was treated to 42.56 Gy in 16 fractions followed by an 8 Gy boost to the surgical site in 4 fractions  Narrative:  The patient was contacted today for routine follow-up. During treatment she did very well with radiotherapy and did not have significant desquamation. She reports she is doing pretty well and her skin is healing up.   Impression/Plan: 1. Stage IB, pT1cN0M0 grade 3, triple negative invasive ductal carcinoma of the right breast. The patient has been doing well since completion of radiotherapy. We discussed that we would be happy to continue to follow her as needed, but she will also continue to follow up with Dr. Lindi Adie in medical oncology. She was counseled on skin care as well as measures to avoid sun exposure to this area.  2. Survivorship. We discussed the importance of survivorship evaluation and encouraged her to attend her upcoming visit with that clinic.    Carola Rhine, PAC

## 2019-10-11 NOTE — Progress Notes (Signed)
  Radiation Oncology         [phone]) [phone] ________________________________  Name: Kathleen Potter MRN: [id number]  Date: 09/11/2019  DOB: 29-Jun-1955  End of Treatment Note  Diagnosis:   Right-sided breast cancer     Indication for treatment:  Curative       Radiation treatment dates:   08/15/2019 2021  Site/dose:   The patient initially received a dose of 42.56 Gy in 16 fractions to the breast using whole-breast tangent fields. This was delivered using a 3-D conformal technique. The patient then received a boost to the seroma. This delivered an additional 10 Gy in 14fractions using an en face electron field due to the depth of the seroma. The total dose was 52.56Gy.  Narrative: The patient tolerated radiation treatment relatively well.   The patient had some expected skin irritation as she progressed during treatment. Moist desquamation was not present at the end of treatment.  Plan: The patient has completed radiation treatment. The patient will return to radiation oncology clinic for routine followup in one month. I advised the patient to call or return sooner if they have any questions or concerns related to their recovery or treatment. ________________________________  Jodelle Gross, M.D., Ph.D.

## 2019-10-12 ENCOUNTER — Ambulatory Visit (HOSPITAL_BASED_OUTPATIENT_CLINIC_OR_DEPARTMENT_OTHER)
Admission: RE | Admit: 2019-10-12 | Discharge: 2019-10-12 | Disposition: A | Discharge status: Home / Self Care | Payer: BC Managed Care – PPO | Source: Ambulatory Visit | Attending: Hematology and Oncology | Admitting: Hematology and Oncology

## 2019-10-12 ENCOUNTER — Ambulatory Visit (INDEPENDENT_AMBULATORY_CARE_PROVIDER_SITE_OTHER): Payer: BC Managed Care – PPO | Admitting: Internal Medicine

## 2019-10-12 ENCOUNTER — Ambulatory Visit (HOSPITAL_COMMUNITY)
Admission: RE | Admit: 2019-10-12 | Discharge: 2019-10-12 | Disposition: A | Discharge status: Home / Self Care | Payer: BC Managed Care – PPO | Source: Ambulatory Visit | Attending: Hematology and Oncology | Admitting: Hematology and Oncology

## 2019-10-12 ENCOUNTER — Encounter: Payer: Self-pay | Admitting: Internal Medicine

## 2019-10-12 ENCOUNTER — Encounter: Payer: Self-pay | Admitting: *Deleted

## 2019-10-12 ENCOUNTER — Telehealth: Payer: Self-pay | Admitting: Internal Medicine

## 2019-10-12 ENCOUNTER — Other Ambulatory Visit: Payer: Self-pay

## 2019-10-12 VITALS — BP 126/78 | HR 78 | Ht 66.0 in | Wt 243.6 lb

## 2019-10-12 DIAGNOSIS — C50211 Malignant neoplasm of upper-inner quadrant of right female breast: Secondary | ICD-10-CM

## 2019-10-12 DIAGNOSIS — Z171 Estrogen receptor negative status [ER-]: Secondary | ICD-10-CM

## 2019-10-12 DIAGNOSIS — I5189 Other ill-defined heart diseases: Secondary | ICD-10-CM | POA: Diagnosis not present

## 2019-10-12 DIAGNOSIS — I10 Essential (primary) hypertension: Secondary | ICD-10-CM | POA: Insufficient documentation

## 2019-10-12 DIAGNOSIS — M7989 Other specified soft tissue disorders: Secondary | ICD-10-CM | POA: Insufficient documentation

## 2019-10-12 MED ORDER — APIXABAN 5 MG PO TABS: 5.0000 mg | ORAL_TABLET | Freq: Two times a day (BID) | ORAL | 2 refills | Status: DC

## 2019-10-12 NOTE — Telephone Encounter (Signed)
Thanks Gaya - I'll see what I can do for her.  -[place]ali

## 2019-10-12 NOTE — Progress Notes (Signed)
  Echocardiogram 2D Echocardiogram has been performed.  Maximilian Tallo G Kalesha Irving 10/12/2019, 11:10 AM

## 2019-10-12 NOTE — Telephone Encounter (Signed)
I was contacted by Cloria Spring, sonographer, regarding critical finding on echocardiogram performed today. 1.5 x 1.3 cm echodensity seen in the right atrium, not previously noted on echo from 12/2018. Nonmobile, possibly adherent to catheter tip vs atrial aspect of tricuspid valve.   Patient has breast cancer and receiving chemotherapy with Port in place. Findings suggestive of possible acute vs organized, chronic thrombus on catheter tip vs infectious vegetation in setting of immunosuppression.   We attempted to coordinate TEE today given patient's NPO status however no availability in endoscopy. Patient is currently asymptomatic from cardiopulmonary standpoint.   Arranged same day consultation in cardiology with DOD Dr. Debara Pickett for physical examination, laboratory work if indicated and possible initiation of anticoagulation.   I discussed these recommendations and plan of care with the patient who demonstrated understanding of the plan and is in agreement with cardiology consultation today. Instructions provided on presentation to [place] office.

## 2019-10-12 NOTE — Patient Instructions (Addendum)
Medication Instructions:  Start Eliquis 5 mg twice daily   *If you need a refill on your cardiac medications before your next appointment, please call your pharmacy*   Lab Work: CBC, BMET today COVID TESTING- 05/22 11:55 AM (right before lunch)  If you have labs (blood work) drawn today and your tests are completely normal, you will receive your results only by: Marland Kitchen MyChart Message (if you have MyChart) OR . A paper copy in the mail If you have any lab test that is abnormal or we need to change your treatment, we will call you to review the results.   Testing/Procedures: Your physician has requested that you have a TEE. During a TEE, sound waves are used to create images of your heart. It provides your doctor with information about the size and shape of your heart and how well your heart's chambers and valves are working. In this test, a transducer is attached to the end of a flexible tube that is guided down you throat and into your esophagus (the tube leading from your mouth to your stomach) to get a more detailed image of your heart.   Follow-Up: At [facility], you and your health needs are our priority.  As part of our continuing mission to provide you with exceptional heart care, we have created designated Provider Care Teams.  These Care Teams include your primary Cardiologist (physician) and Advanced Practice Providers (APPs -  Physician Assistants and Nurse Practitioners) who all work together to provide you with the care you need, when you need it.  We recommend signing up for the patient portal called "MyChart".  Sign up information is provided on this After Visit Summary.  MyChart is used to connect with patients for Virtual Visits (Telemedicine).  Patients are able to view lab/test results, encounter notes, upcoming appointments, etc.  Non-urgent messages can be sent to your provider as well.   To learn more about what you can do with MyChart, go to [email or web address].     Your next appointment:   1-2 month(s)  The format for your next appointment:   In Person  Provider:   Raliegh Ip Mali Hilty, MD   Other Instructions  You are scheduled for a TEE on Tuesday with Dr. Debara Pickett.  Please arrive at the [facility] (Main Entrance A) at [facility]: [address] [place], [place] [address] at 11:30 am. (1 hour prior to procedure unless lab work is needed; if lab work is needed arrive 1.5 hours ahead)  DIET: Nothing to eat or drink after midnight except a sip of water with medications (see medication instructions below)  Medication Instructions:  Continue your anticoagulant: Eliquis  You will need to continue your anticoagulant after your procedure until you  are told by your  Provider that it is safe to stop   Labs: BMET, CBC today   You must have a responsible person to drive you home and stay in the waiting area during your procedure. Failure to do so could result in cancellation.  Bring your insurance cards.  *Special Note: Every effort is made to have your procedure done on time. Occasionally there are emergencies that occur at the hospital that may cause delays. Please be patient if a delay does occur.

## 2019-10-12 NOTE — Progress Notes (Signed)
Lower extremity venous has been completed.   Preliminary results in CV Proc.   Abram Sander 10/12/2019 8:48 AM

## 2019-10-12 NOTE — Progress Notes (Signed)
Received call from Dr. Debara Pickett stating during pt echocardiogram today, it was noted pt to have possible thrombus vs vegetation at the tip of the port venous catheter in the right atrium.  Dr. Debara Pickett states pt will be started on Elaquis and will receive a TEE on Tuesday 10/16/19.  RN will review with MD.

## 2019-10-13 ENCOUNTER — Other Ambulatory Visit (HOSPITAL_COMMUNITY)
Admission: RE | Admit: 2019-10-13 | Discharge: 2019-10-13 | Disposition: A | Discharge status: Home / Self Care | Payer: BC Managed Care – PPO | Source: Ambulatory Visit | Attending: Internal Medicine | Admitting: Internal Medicine

## 2019-10-13 DIAGNOSIS — Z01812 Encounter for preprocedural laboratory examination: Secondary | ICD-10-CM | POA: Diagnosis present

## 2019-10-13 DIAGNOSIS — Z20822 Contact with and (suspected) exposure to covid-19: Secondary | ICD-10-CM | POA: Insufficient documentation

## 2019-10-13 LAB — SARS CORONAVIRUS 2 (TAT 6-24 HRS): SARS Coronavirus 2: NEGATIVE

## 2019-10-14 ENCOUNTER — Encounter: Payer: Self-pay | Admitting: Internal Medicine

## 2019-10-14 NOTE — Progress Notes (Signed)
OFFICE CONSULT NOTE  Chief Complaint:  Evaluate for abnormal echo finding  Primary Care Physician: Aretta Nip, MD  HPI:  Kathleen Potter is a 64 y.o. female who is being seen today for the evaluation of abnormal echo finding at the request of Rankins, Bill Salinas, MD.  This is a very pleasant 64 year old female who unfortunately has breast cancer and has been undergoing treatment for that.  She had an echocardiogram ordered by her oncologist because she has had some progressive lower extremity swelling.  She also had lower extremity venous Dopplers performed.  Painfully were negative for DVT however her echocardiogram was abnormal.  This was performed and interpreted today by the reading cardiologist Dr. Margaretann Loveless, who noted that the EF was normal 55 to 60% with grade 1 diastolic dysfunction.  There was a finding of a 1.5 x 1.35 cm echodensity in the right atrium possibly associated with a venous port catheter suggestive of thrombus or vegetation.  Attempts were made to arrange for possible TEE however no availability was made and ultimately she was scheduled for me as the doctor of the day at the [facility] office to see.  Kathleen Potter feels well, denied chest pain or shortness of breath.  She has had no fever or chills, weight loss or other constitutional symptoms concerning for possible endocarditis.  She reports she had the port placed in October 2020 when she had lumpectomy and sentinel lymph node biopsy.  PMHx:  Past Medical History:  Diagnosis Date  . Family history of breast cancer   . Family history of prostate cancer   . Hypertension    Pt states her BP has been running high lately.    Past Surgical History:  Procedure Laterality Date  . BREAST BIOPSY Right   . BREAST LUMPECTOMY WITH RADIOACTIVE SEED AND SENTINEL LYMPH NODE BIOPSY Right 03/01/2019   Procedure: RIGHT BREAST LUMPECTOMY WITH RADIOACTIVE SEED;  Surgeon: Erroll Luna, MD;  Location: [place];  Service:  General;  Laterality: Right;  . PARTIAL HYSTERECTOMY    . PORTACATH PLACEMENT N/A 03/01/2019   Procedure: INSERTION PORT-A-CATH WITH ULTRASOUND;  Surgeon: Erroll Luna, MD;  Location: [place];  Service: General;  Laterality: N/A;  . SENTINEL NODE BIOPSY Right 03/01/2019   Procedure: Right Sentinel Lymph Node Biopsy;  Surgeon: Erroll Luna, MD;  Location: [place];  Service: General;  Laterality: Right;    FAMHx:  Family History  Problem Relation Age of Onset  . Breast cancer Sister 42  . Prostate cancer Paternal Uncle        dx. early-mid 7s, not metastatic    SOCHx:   reports that she has never smoked. She has never used smokeless tobacco. She reports previous alcohol use. She reports that she does not use drugs.  ALLERGIES:  Allergies  Allergen Reactions  . Latex Itching    ROS: Pertinent items noted in HPI and remainder of comprehensive ROS otherwise negative.  HOME MEDS: Current Outpatient Medications on File Prior to Visit  Medication Sig Dispense Refill  . lidocaine-prilocaine (EMLA) cream Apply to affected area once (Patient not taking: Reported on 10/12/2019) 30 g 3  . potassium chloride (KLOR-CON) 10 MEQ tablet TAKE 1 TABLET BY MOUTH EVERY DAY (Patient taking differently: Take 10 mEq by mouth daily. ) 30 tablet 0   No current facility-administered medications on file prior to visit.    LABS/IMAGING: No results found for this or any previous visit (from the past 48 hour(s)). No results found.  LIPID PANEL: No results found for: CHOL, TRIG, HDL, CHOLHDL, VLDL, LDLCALC, LDLDIRECT  WEIGHTS: Wt Readings from Last 3 Encounters:  10/12/19 243 lb 9.6 oz (110.5 kg)  09/10/19 242 lb 4.8 oz (109.9 kg)  07/24/19 247 lb 3.2 oz (112.1 kg)    VITALS: BP 126/78   Pulse 78   Ht 5\' 6"  (1.676 m)   Wt 243 lb 9.6 oz (110.5 kg)   SpO2 98%   BMI 39.32 kg/m   EXAM: General appearance: alert and no distress Neck: no carotid bruit, no JVD and thyroid not enlarged,  symmetric, no tenderness/mass/nodules Lungs: clear to auscultation bilaterally Heart: regular rate and rhythm Abdomen: soft, non-tender; bowel sounds normal; no masses,  no organomegaly Extremities: extremities normal, atraumatic, no cyanosis or edema Pulses: 2+ and symmetric Skin: Skin color, texture, turgor normal. No rashes or lesions Neurologic: Grossly normal Psych: Pleasant  EKG: Normal sinus rhythm 78- personally reviewed  ASSESSMENT: 1. 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 Breast cancer-undergoing therapy 3. Bilateral leg edema  PLAN: 1.   Kathleen Potter has a right atrial mass which is suggestive of thrombus associated with her venous infusion port.  She has no constitutional symptoms or concerning features for endocarditis.  She will need further imaging to better characterize relationship of the mass with regards to her catheter and help further determine the best method to remove that catheter.  Because of the embolic risk associated with this, I review the literature suggests that anticoagulation is recommended.  We will start Eliquis 5 mg twice daily.  I reached out to Dr. Geralyn Flash office Friday afternoon however he was not in the office.  I did speak with his nurse who felt that if I recommended starting anticoagulation he would agree with it.  We also scheduled the patient for a TEE, the next available date is next Tuesday which happens to be with me.  I did discuss the TEE procedure today, including the risk, benefits and alternatives and she is agreeable to proceed.  Kathleen Casino, MD, Kathleen Potter, Kathleen Potter Director of the Advanced Lipid Disorders &  Cardiovascular Risk Reduction Clinic Diplomate of the American Board of Clinical Lipidology Attending Cardiologist  Direct Dial: [phone]  Fax: [phone]  Website:  [email or web address] 10/14/2019, 9:07 PM

## 2019-10-14 NOTE — Progress Notes (Signed)
Patient Care Team: Rankins, Bill Salinas, MD as PCP - General (Family Medicine) Rockwell Germany, RN as Oncology Nurse Navigator Tressie Ellis, Paulette Blanch, RN as Oncology Nurse Navigator Erroll Luna, MD as Consulting Physician (General Surgery) Nicholas Lose, MD as Consulting Physician (Hematology and Oncology) Kyung Rudd, MD as Consulting Physician (Radiation Oncology)  DIAGNOSIS:    ICD-10-CM   1. Malignant neoplasm of upper-inner quadrant of right breast in female, estrogen receptor negative ([place])  C50.211 CBC with Differential ([place] Only)   F02.7 [place] HISTORY: Oncology History  Malignant neoplasm of upper-inner quadrant of right breast in female, estrogen receptor negative ([place])  01/02/2019 Initial Diagnosis   Routine screening mammogram detected a 1.2cm mass in the right breast at the 12:30 location 7 cm from the nipple, no axillary adenopathy. Biopsy showed IDC, grade 2-3, HER-2 - (1+), ER -, PR -, Ki67 80%.    01/03/2019 Cancer Staging   Staging form: Breast, AJCC 8th Edition - Clinical stage from 01/03/2019: Stage IB (cT1c, cN0, cM0, G3, ER-, PR-, HER2-) - Signed by Nicholas Lose, MD on 01/03/2019   03/01/2019 Surgery   Lumpectomy ([place]): IDC, grade 3, 1.4cm, clear margins, 3 lymph nodes negative for carcinoma.    03/09/2019 Cancer Staging   Staging form: Breast, AJCC 8th Edition - Pathologic stage from 03/09/2019: Stage IB (pT1c, pN0, cM0, G3, ER-, PR-, HER2-) - Signed by Nicholas Lose, MD on 03/09/2019   03/27/2019 -  Chemotherapy   The patient had DOXOrubicin (ADRIAMYCIN) chemo injection 118 mg, 50 mg/m2 = 118 mg (83.3 % of original dose 60 mg/m2), Intravenous,  Once, 4 of 4 cycles Dose modification: 50 mg/m2 (original dose 60 mg/m2, Cycle 1, Reason: Provider Judgment), 45 mg/m2 (original dose 60 mg/m2, Cycle 3, Reason: Provider Judgment) Administration: 118 mg (03/27/2019), 118 mg (04/10/2019), 106 mg  (04/24/2019), 106 mg (05/08/2019) palonosetron (ALOXI) injection 0.25 mg, 0.25 mg, Intravenous,  Once, 12 of 15 cycles Administration: 0.25 mg (03/27/2019), 0.25 mg (04/10/2019), 0.25 mg (04/24/2019), 0.25 mg (05/08/2019), 0.25 mg (05/29/2019), 0.25 mg (06/06/2019), 0.25 mg (06/12/2019), 0.25 mg (06/19/2019), 0.25 mg (06/26/2019), 0.25 mg (07/03/2019), 0.25 mg (07/10/2019), 0.25 mg (07/17/2019) pegfilgrastim-jmdb (FULPHILA) injection 6 mg, 6 mg, Subcutaneous,  Once, 4 of 4 cycles Administration: 6 mg (03/29/2019), 6 mg (04/12/2019), 6 mg (04/26/2019), 6 mg (05/10/2019) cyclophosphamide (CYTOXAN) 1,180 mg in sodium chloride 0.9 % 250 mL chemo infusion, 500 mg/m2 = 1,180 mg (83.3 % of original dose 600 mg/m2), Intravenous,  Once, 4 of 4 cycles Dose modification: 500 mg/m2 (original dose 600 mg/m2, Cycle 1, Reason: Provider Judgment), 450 mg/m2 (original dose 600 mg/m2, Cycle 3, Reason: Provider Judgment) Administration: 1,180 mg (03/27/2019), 1,180 mg (04/10/2019), 1,060 mg (04/24/2019), 1,060 mg (05/08/2019) PACLitaxel (TAXOL) 192 mg in sodium chloride 0.9 % 250 mL chemo infusion (</= 6m/m2), 80 mg/m2 = 192 mg, Intravenous,  Once, 9 of 12 cycles Dose modification: 65 mg/m2 (original dose 80 mg/m2, Cycle 12, Reason: Dose not tolerated) Administration: 192 mg (05/22/2019), 192 mg (05/29/2019), 192 mg (06/06/2019), 192 mg (06/12/2019), 192 mg (06/19/2019), 192 mg (06/26/2019), 192 mg (07/03/2019), 156 mg (07/10/2019), 156 mg (07/17/2019) fosaprepitant (EMEND) 150 mg, dexamethasone (DECADRON) 12 mg in sodium chloride 0.9 % 145 mL IVPB, , Intravenous,  Once, 4 of 4 cycles Administration:  (03/27/2019),  (04/10/2019),  (04/24/2019),  (05/08/2019)  for chemotherapy treatment.    08/16/2019 - 09/11/2019 Radiation Therapy   Adjuvant radiation     CHIEF COMPLIANT:  Surveillance of triple negative right breast cancer   INTERVAL HISTORY: Kathleen Potter is a 64 y.o. with above-mentioned history of triple negative right breast cancer  who underwent a lumpectomy, adjuvant chemotherapy, radiation, and is currently on surviellance.Echo on 10/12/18 showed an ejection fraction of 55-60%. Bilateral lower extremity US following increased edema showed no evidence of DVT. She presents to the clinic todayfor follow-up.  The echocardiogram showed blood clot at the tip of the port.  She saw Dr. Debara Pickett who is planning to perform a TEE.  She was started on anticoagulation with Eliquis.  ALLERGIES:  is allergic to latex.  MEDICATIONS:  Current Outpatient Medications  Medication Sig Dispense Refill   apixaban (ELIQUIS) 5 MG TABS tablet Take 1 tablet (5 mg total) by mouth 2 (two) times daily. 60 tablet 2   Cholecalciferol (VITAMIN D) 50 MCG (2000 UT) tablet Take 2,000 Units by mouth daily.     Cyanocobalamin (B-12-SL) 1000 MCG SUBL Place 1,000 mcg under the tongue daily.     furosemide (LASIX) 20 MG tablet Take 1 tablet (20 mg total) by mouth daily. 30 tablet 1   ibuprofen (ADVIL) 800 MG tablet Take 800 mg by mouth every 8 (eight) hours as needed for moderate pain.     lidocaine-prilocaine (EMLA) cream Apply to affected area once (Patient not taking: Reported on 10/12/2019) 30 g 3   potassium chloride (KLOR-CON) 10 MEQ tablet TAKE 1 TABLET BY MOUTH EVERY DAY (Patient taking differently: Take 10 mEq by mouth daily. ) 30 tablet 0   pyridOXINE (VITAMIN B-6) 100 MG tablet Take 100 mg by mouth 3 (three) times a week.     TURMERIC CURCUMIN PO Take 1 tablet by mouth 3 (three) times a week.     Wound Dressings (SONAFINE EX) Apply 1 application topically daily as needed (radiation burn).     No current facility-administered medications for this visit.    PHYSICAL EXAMINATION: ECOG PERFORMANCE STATUS: 1 - Symptomatic but completely ambulatory  Vitals:   10/15/19 1044  BP: (!) 150/79  Pulse: 85  Resp: 18  Temp: 98.7 F (37.1 C)  SpO2: 99%   Filed Weights   10/15/19 1044  Weight: 241 lb 14.4 oz (109.7 kg)    BREAST: No palpable  masses or nodules in either right or left breasts. No palpable axillary supraclavicular or infraclavicular adenopathy no breast tenderness or nipple discharge. (exam performed in the presence of a chaperone)  LABORATORY DATA:  I have reviewed the data as listed CMP Latest Ref Rng & Units 07/24/2019 07/17/2019 07/10/2019  Glucose 70 - 99 mg/dL 115(H) 113(H) 114(H)  BUN 8 - 23 mg/dL '10 14 12  ' Creatinine 0.44 - 1.00 mg/dL 0.79 0.80 0.87  Sodium 135 - 145 mmol/L 143 144 145  Potassium 3.5 - 5.1 mmol/L 3.1(L) 3.1(L) 3.2(L)  Chloride 98 - 111 mmol/L 108 110 110  CO2 22 - 32 mmol/L '25 26 25  ' Calcium 8.9 - 10.3 mg/dL 8.4(L) 8.5(L) 8.5(L)  Total Protein 6.5 - 8.1 g/dL 6.5 6.2(L) 6.5  Total Bilirubin 0.3 - 1.2 mg/dL 0.5 0.4 0.4  Alkaline Phos 38 - 126 U/L 62 62 66  AST 15 - 41 U/L 15 14(L) 14(L)  ALT 0 - 44 U/L '17 18 17    ' Lab Results  Component Value Date   WBC 3.7 (L) 07/24/2019   HGB 9.8 (L) 07/24/2019   HCT 31.3 (L) 07/24/2019   MCV 78.1 (L) 07/24/2019   PLT 223 07/24/2019   NEUTROABS 2.5  07/24/2019    ASSESSMENT & PLAN:  Malignant neoplasm of upper-inner quadrant of right breast in female, estrogen receptor negative ([place]) 01/02/2019:Routine screening mammogram detected a 1.2cm mass in the right breast at the 12:30 location 7 cm from the nipple, no axillary adenopathy. Biopsy showed IDC, grade 2-3, HER-2 - (1+), ER -, PR -, Ki67 80%.  03/01/2019: Right lumpectomy: Grade 3 IDC, 1.4 cm, margins clear, 0/3 lymph nodes negative, triple negative stage IIb  Treatment plan: 1.Adjuvant chemotherapy with dose dense Adriamycin and Cytoxan x4 followed by Taxol weekly x9 (completed February 2021) 2.Followed by radiation therapy completed 09/11/2019  Patient enrolled in [place] 1714 clinical trial ----------------------------------------------------------------------------------------------------------------------------------------------------- Surveillance: Breast exams 10/15/2019:  Benign mammograms: Will be scheduled once a year. There is no role of antiestrogen therapy because she is ER/PR negative.  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  I encouraged her to exercise and walk regularly.  The neuropathy symptoms are getting better with time.  Lower extremity edema: 10/12/2019 echocardiogram of the heart: EF 55 to 60%: LV function normal however there was a clot at the tip of the port.  She is scheduled to have TEE done tomorrow by Dr. Debara Pickett.  She was started on Eliquis anticoagulation on 10/12/2019. I suggested that she should remain on anticoagulation for at least 3 months before rechecking for the blood clot.  If the clot has resolved then she can have the port removed. For the lower extremity swelling I sent a prescription for Lasix to be taken once a day until the leg swelling goes away. She already takes potassium supplement.  ultrasound Dopplers of lower extremities were negative for DVT Today we will draw CBC and BMP for her TEE test.  Return to clinic in 3 months for follow-up    Orders Placed This Encounter  Procedures   CBC with Differential ([place] Only)    Standing Status:   Future    Standing Expiration Date:   2/87/8676   Basic Metabolic Panel - [place] Only    Standing Status:   Future    Standing Expiration Date:   10/14/2020   The patient has a good understanding of the overall plan. she agrees with it. she will call with any problems that may develop before the next visit here.  Total time spent: 30 mins including face to face time and time spent for planning, charting and coordination of care  Nicholas Lose, MD 10/15/2019  I, Cloyde Reams Dorshimer, am acting as scribe for Dr. Nicholas Lose.  I have reviewed the above documentation for accuracy and completeness, and I agree with the above.

## 2019-10-15 ENCOUNTER — Other Ambulatory Visit: Payer: Self-pay

## 2019-10-15 ENCOUNTER — Inpatient Hospital Stay: Payer: BC Managed Care – PPO

## 2019-10-15 ENCOUNTER — Inpatient Hospital Stay: Payer: BC Managed Care – PPO | Attending: Hematology and Oncology | Admitting: Hematology and Oncology

## 2019-10-15 DIAGNOSIS — Z9221 Personal history of antineoplastic chemotherapy: Secondary | ICD-10-CM | POA: Diagnosis not present

## 2019-10-15 DIAGNOSIS — Z7901 Long term (current) use of anticoagulants: Secondary | ICD-10-CM | POA: Insufficient documentation

## 2019-10-15 DIAGNOSIS — I749 Embolism and thrombosis of unspecified artery: Secondary | ICD-10-CM | POA: Diagnosis not present

## 2019-10-15 DIAGNOSIS — Z923 Personal history of irradiation: Secondary | ICD-10-CM | POA: Diagnosis not present

## 2019-10-15 DIAGNOSIS — Z171 Estrogen receptor negative status [ER-]: Secondary | ICD-10-CM

## 2019-10-15 DIAGNOSIS — G620 Drug-induced polyneuropathy: Secondary | ICD-10-CM | POA: Insufficient documentation

## 2019-10-15 DIAGNOSIS — M7989 Other specified soft tissue disorders: Secondary | ICD-10-CM | POA: Insufficient documentation

## 2019-10-15 DIAGNOSIS — C50211 Malignant neoplasm of upper-inner quadrant of right female breast: Secondary | ICD-10-CM | POA: Insufficient documentation

## 2019-10-15 LAB — CBC WITH DIFFERENTIAL (CANCER CENTER ONLY)
Abs Immature Granulocytes: 0.01 10*3/uL (ref 0.00–0.07)
Basophils Absolute: 0 10*3/uL (ref 0.0–0.1)
Basophils Relative: 0 %
Eosinophils Absolute: 0.1 10*3/uL (ref 0.0–0.5)
Eosinophils Relative: 1 %
HCT: 38.1 % (ref 36.0–46.0)
Hemoglobin: 11.8 g/dL — ABNORMAL LOW (ref 12.0–15.0)
Immature Granulocytes: 0 %
Lymphocytes Relative: 23 %
Lymphs Abs: 1.1 10*3/uL (ref 0.7–4.0)
MCH: 23.1 pg — ABNORMAL LOW (ref 26.0–34.0)
MCHC: 31 g/dL (ref 30.0–36.0)
MCV: 74.6 fL — ABNORMAL LOW (ref 80.0–100.0)
Monocytes Absolute: 0.4 10*3/uL (ref 0.1–1.0)
Monocytes Relative: 8 %
Neutro Abs: 3.3 10*3/uL (ref 1.7–7.7)
Neutrophils Relative %: 68 %
Platelet Count: 166 10*3/uL (ref 150–400)
RBC: 5.11 MIL/uL (ref 3.87–5.11)
RDW: 16.2 % — ABNORMAL HIGH (ref 11.5–15.5)
WBC Count: 4.8 10*3/uL (ref 4.0–10.5)
nRBC: 0 % (ref 0.0–0.2)

## 2019-10-15 LAB — BASIC METABOLIC PANEL - CANCER CENTER ONLY
Anion gap: 8 (ref 5–15)
BUN: 13 mg/dL (ref 8–23)
CO2: 26 mmol/L (ref 22–32)
Calcium: 8.8 mg/dL — ABNORMAL LOW (ref 8.9–10.3)
Chloride: 111 mmol/L (ref 98–111)
Creatinine: 0.86 mg/dL (ref 0.44–1.00)
GFR, Est AFR Am: >60 mL/min (ref >60)
GFR, Estimated: >60 mL/min (ref >60)
Glucose, Bld: 95 mg/dL (ref 70–99)
Potassium: 3.8 mmol/L (ref 3.5–5.1)
Sodium: 145 mmol/L (ref 135–145)

## 2019-10-15 MED ORDER — FUROSEMIDE 20 MG PO TABS
20.0000 mg | ORAL_TABLET | Freq: Every day | ORAL | 1 refills | Status: DC
Start: 2019-10-15 — End: 2019-11-16

## 2019-10-15 NOTE — Assessment & Plan Note (Signed)
01/02/2019:Routine screening mammogram detected a 1.2cm mass in the right breast at the 12:30 location 7 cm from the nipple, no axillary adenopathy. Biopsy showed IDC, grade 2-3, HER-2 - (1+), ER -, PR -, Ki67 80%.  03/01/2019: Right lumpectomy: Grade 3 IDC, 1.4 cm, margins clear, 0/3 lymph nodes negative, triple negative stage IIb  Treatment plan: 1.Adjuvant chemotherapy with dose dense Adriamycin and Cytoxan x4 followed by Taxol weekly x9 (completed February 2021) 2.Followed by radiation therapy completed 09/11/2019  Patient enrolled in [place] 1714 clinical trial ----------------------------------------------------------------------------------------------------------------------------------------------------- Surveillance: Breast exams 10/15/2019: Benign mammograms: Will be scheduled once a year. There is no role of antiestrogen therapy because she is ER/PR negative.  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  I encouraged her to exercise and walk regularly.  The neuropathy symptoms are getting better with time.  Lower extremity edema: 10/12/2019 echocardiogram of the heart: EF 55 to 60%: LV function normal ultrasound Dopplers of lower extremities were negative for DVT  Return to clinic in 6 months for follow-up

## 2019-10-16 ENCOUNTER — Encounter (HOSPITAL_COMMUNITY): Payer: Self-pay | Admitting: Internal Medicine

## 2019-10-16 ENCOUNTER — Ambulatory Visit (HOSPITAL_BASED_OUTPATIENT_CLINIC_OR_DEPARTMENT_OTHER): Payer: BC Managed Care – PPO

## 2019-10-16 ENCOUNTER — Ambulatory Visit (HOSPITAL_COMMUNITY)
Admission: RE | Admit: 2019-10-16 | Discharge: 2019-10-16 | Disposition: A | Discharge status: Home / Self Care | Payer: BC Managed Care – PPO | Attending: Internal Medicine | Admitting: Internal Medicine

## 2019-10-16 ENCOUNTER — Ambulatory Visit (HOSPITAL_COMMUNITY): Payer: BC Managed Care – PPO | Admitting: Anesthesiology

## 2019-10-16 ENCOUNTER — Encounter (HOSPITAL_COMMUNITY)
Admission: RE | Disposition: A | Discharge status: Home / Self Care | Payer: Self-pay | Source: Home / Self Care | Attending: Internal Medicine

## 2019-10-16 DIAGNOSIS — Z8042 Family history of malignant neoplasm of prostate: Secondary | ICD-10-CM | POA: Diagnosis not present

## 2019-10-16 DIAGNOSIS — Z90711 Acquired absence of uterus with remaining cervical stump: Secondary | ICD-10-CM | POA: Diagnosis not present

## 2019-10-16 DIAGNOSIS — Z91041 Radiographic dye allergy status: Secondary | ICD-10-CM | POA: Diagnosis not present

## 2019-10-16 DIAGNOSIS — I5189 Other ill-defined heart diseases: Secondary | ICD-10-CM | POA: Insufficient documentation

## 2019-10-16 DIAGNOSIS — I513 Intracardiac thrombosis, not elsewhere classified: Secondary | ICD-10-CM | POA: Diagnosis not present

## 2019-10-16 DIAGNOSIS — Z79899 Other long term (current) drug therapy: Secondary | ICD-10-CM | POA: Diagnosis not present

## 2019-10-16 DIAGNOSIS — C50211 Malignant neoplasm of upper-inner quadrant of right female breast: Secondary | ICD-10-CM | POA: Diagnosis not present

## 2019-10-16 DIAGNOSIS — Z803 Family history of malignant neoplasm of breast: Secondary | ICD-10-CM | POA: Diagnosis not present

## 2019-10-16 DIAGNOSIS — I10 Essential (primary) hypertension: Secondary | ICD-10-CM | POA: Insufficient documentation

## 2019-10-16 DIAGNOSIS — R600 Localized edema: Secondary | ICD-10-CM | POA: Diagnosis not present

## 2019-10-16 DIAGNOSIS — Z171 Estrogen receptor negative status [ER-]: Secondary | ICD-10-CM | POA: Insufficient documentation

## 2019-10-16 HISTORY — PX: TEE WITHOUT CARDIOVERSION: SHX5443

## 2019-10-16 SURGERY — ECHOCARDIOGRAM, TRANSESOPHAGEAL
Anesthesia: Monitor Anesthesia Care

## 2019-10-16 MED ORDER — PROPOFOL 500 MG/50ML IV EMUL
INTRAVENOUS | Status: DC | PRN
  Administered 2019-10-16: 150 ug/kg/min via INTRAVENOUS

## 2019-10-16 MED ORDER — SODIUM CHLORIDE 0.9 % IV SOLN
INTRAVENOUS | Status: AC | PRN
  Administered 2019-10-16: 500 mL via INTRAMUSCULAR

## 2019-10-16 MED ORDER — SODIUM CHLORIDE 0.9 % IV SOLN: INTRAVENOUS | Status: DC

## 2019-10-16 MED ORDER — LIDOCAINE 2% (20 MG/ML) 5 ML SYRINGE
INTRAMUSCULAR | Status: DC | PRN
  Administered 2019-10-16: 60 mg via INTRAVENOUS

## 2019-10-16 NOTE — H&P (Signed)
   INTERVAL PROCEDURE H&P  History and Physical Interval Note:  10/16/2019 11:33 AM  Kathleen Potter has presented today for their planned procedure. The various methods of treatment have been discussed with the patient and family. After consideration of risks, benefits and other options for treatment, the patient has consented to the procedure.  The patients' outpatient history has been reviewed, patient examined, and no change in status from most recent office note within the past 30 days. I have reviewed the patients' chart and labs and will proceed as planned. Questions were answered to the patient's satisfaction.   Kathleen Casino, MD, [facility], [place] Director of the Advanced Lipid Disorders &  Cardiovascular Risk Reduction Clinic Diplomate of the American Board of Clinical Lipidology Attending Cardiologist  Direct Dial: [phone]  Fax: [phone]  Website:  [email or web address] 10/16/2019, 11:33 AM

## 2019-10-16 NOTE — Anesthesia Postprocedure Evaluation (Signed)
Anesthesia Post Note  Patient: Marveen Donlon  Procedure(s) Performed: TRANSESOPHAGEAL ECHOCARDIOGRAM (TEE) (N/A )     Patient location during evaluation: Endoscopy Anesthesia Type: MAC Level of consciousness: awake and alert Pain management: pain level controlled Vital Signs Assessment: post-procedure vital signs reviewed and stable Respiratory status: spontaneous breathing, nonlabored ventilation and respiratory function stable Cardiovascular status: stable and blood pressure returned to baseline Postop Assessment: no apparent nausea or vomiting Anesthetic complications: no    Last Vitals:  Vitals:   10/16/19 1315 10/16/19 1323  BP: (!) 143/77 (!) 150/82  Pulse: 82 76  Resp: 20 (!) 23  Temp: 36.5 C   SpO2: 100% 99%    Last Pain:  Vitals:   10/16/19 1323  TempSrc:   PainSc: 0-No pain                 Caylynn Minchew,W. EDMOND

## 2019-10-16 NOTE — Discharge Instructions (Signed)

## 2019-10-16 NOTE — Anesthesia Preprocedure Evaluation (Addendum)
Anesthesia Evaluation  Patient identified by MRN, date of birth, ID band Patient awake    Reviewed: Allergy & Precautions, H&P , NPO status , Patient's Chart, lab work & pertinent test results  Airway Mallampati: II  TM Distance: >3 FB Neck ROM: Full    Dental no notable dental hx. (+) Poor Dentition, Dental Advisory Given   Pulmonary neg pulmonary ROS,    Pulmonary exam normal breath sounds clear to auscultation       Cardiovascular hypertension, Pt. on medications  Rhythm:Regular Rate:Normal     Neuro/Psych negative neurological ROS  negative psych ROS   GI/Hepatic negative GI ROS, Neg liver ROS,   Endo/Other  Morbid obesity  Renal/GU negative Renal ROS  negative genitourinary   Musculoskeletal   Abdominal   Peds  Hematology negative hematology ROS (+)   Anesthesia Other Findings   Reproductive/Obstetrics negative OB ROS                            Anesthesia Physical Anesthesia Plan  ASA: III  Anesthesia Plan: MAC   Post-op Pain Management:    Induction: Intravenous  PONV Risk Score and Plan: 2 and Propofol infusion and Treatment may vary due to age or medical condition  Airway Management Planned: Nasal Cannula  Additional Equipment:   Intra-op Plan:   Post-operative Plan:   Informed Consent: I have reviewed the patients History and Physical, chart, labs and discussed the procedure including the risks, benefits and alternatives for the proposed anesthesia with the patient or authorized representative who has indicated his/her understanding and acceptance.     Dental advisory given  Plan Discussed with: CRNA  Anesthesia Plan Comments:         Anesthesia Quick Evaluation

## 2019-10-16 NOTE — Transfer of Care (Signed)
Immediate Anesthesia Transfer of Care Note  Patient: Eriana Suliman  Procedure(s) Performed: TRANSESOPHAGEAL ECHOCARDIOGRAM (TEE) (N/A )  Patient Location: Endoscopy Unit  Anesthesia Type:MAC  Level of Consciousness: awake, alert  and oriented  Airway & Oxygen Therapy: Patient Spontanous Breathing and Patient connected to nasal cannula oxygen  Post-op Assessment: Report given to RN, Post -op Vital signs reviewed and stable and Patient moving all extremities  Post vital signs: Reviewed and stable  Last Vitals:  Vitals Value Taken Time  BP    Temp    Pulse    Resp    SpO2      Last Pain:  Vitals:   10/16/19 1149  TempSrc: Oral  PainSc: 0-No pain         Complications: No apparent anesthesia complications

## 2019-10-16 NOTE — CV Procedure (Signed)
TRANSESOPHAGEAL ECHOCARDIOGRAM (TEE) NOTE  INDICATIONS: right atrial mass  PROCEDURE:   Informed consent was obtained prior to the procedure. The risks, benefits and alternatives for the procedure were discussed and the patient comprehended these risks.  Risks include, but are not limited to, cough, sore throat, vomiting, nausea, somnolence, esophageal and stomach trauma or perforation, bleeding, low blood pressure, aspiration, pneumonia, infection, trauma to the teeth and death.    After a procedural time-out, the patient was given propofol for sedation.  The patient's heart rate, blood pressure, and oxygen saturation are monitored continuously during the procedure. The transesophageal probe was inserted in the esophagus and stomach without difficulty and multiple views were obtained.  The patient was kept under observation until the patient left the procedure room.  I was present face-to-face 100% of this time. The patient left the procedure room in stable condition.   Agitated microbubble saline contrast was not administered.  COMPLICATIONS:    There were no immediate complications.  Findings:  1. LEFT VENTRICLE: The left ventricular wall thickness is mildly increased.  The left ventricular cavity is normal in size. Wall motion is normal.  LVEF is 55-60%.  2. RIGHT VENTRICLE:  The right ventricle is normal in structure and function without any thrombus or masses.    3. LEFT ATRIUM:  The left atrium is normal in size without any thrombus or masses.  There is not spontaneous echo contrast ("smoke") in the left atrium consistent with a low flow state.  4. LEFT ATRIAL APPENDAGE:  The left atrial appendage is free of any thrombus or masses. The appendage has single lobes. Pulse doppler indicates moderate flow in the appendage.  5. ATRIAL SEPTUM:  The atrial septum appears intact and is free of thrombus and/or masses.  There is no evidence for interatrial shunting by color  doppler.  6. RIGHT ATRIUM:  The right atrium is normal in size and function. There is a large 2.0 x 1.5 cm adherent mass that is along the RV free wall near the IVC junction. The mass is echogenic and has a gelatinous appearance. It moves with the atrium, but independent of the well-visualized catheter tip, suggesting it is not adherent to the catheter.   7. MITRAL VALVE:  The mitral valve is normal in structure and function with no regurgitation.  There were no vegetations or stenosis.  8. AORTIC VALVE:  The aortic valve is trileaflet, normal in structure and function with no regurgitation.  There were no vegetations or stenosis  9. TRICUSPID VALVE:  The tricuspid valve is normal in structure and function with trivial regurgitation.  There were no vegetations or stenosis  10.  PULMONIC VALVE:  The pulmonic valve is normal in structure and function with no regurgitation.  There were no vegetations or stenosis.   11. AORTIC ARCH, ASCENDING AND DESCENDING AORTA:  There was grade 1 Ron Parker et. Al, 1992) atherosclerosis of the ascending aorta, aortic arch, or proximal descending aorta.  12. PULMONARY VEINS: Anomalous pulmonary venous return was not noted.  13. PERICARDIUM: The pericardium appeared normal and non-thickened.  There is no pericardial effusion.  IMPRESSION:   1. 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 The infusion catheter tip moves separate of this, suggesting that the mass is not associated with the catheter tip. DDx include thrombus, tumor (either primary or metastatic) or less likely vegetation -no constitutional symptoms. I am concerned that if this  is thrombus that more material may be in the IVC and perhaps may be causing venous hypertension/poor LE venous return and her leg edema.  RECOMMENDATIONS:    1. Would recommend continuing anticoagulation. Will d/w Dr. Lindi Adie about removing the port - however, may be  reasonable to continue the current plan of 3 months anticoagulation - would recommend repeat TEE to evaluate mass to determine if it has resolved. Also, would consider a CT venogram to evaluate the IVC/RA junction and r/o possible obstructive thrombus -CT can also help differentiate thrombus vs tumor by different densities.  Time Spent Directly with the Patient:  60 minutes   Pixie Casino, MD, [facility], [place] Director of the [facility] Lipid [facility] &  Cardiovascular Risk Reduction Clinic Diplomate of the American Board of Clinical Lipidology Attending Cardiologist  Direct Dial: [phone]  Fax: [phone]  Website:  [email or web address]  Nadean Corwin Ysabel Cowgill 10/16/2019, 1:26 PM

## 2019-10-24 ENCOUNTER — Telehealth: Payer: Self-pay | Admitting: Adult Health

## 2019-10-24 NOTE — Telephone Encounter (Signed)
Called patient regarding upcoming appointments, patient's voicemail is not set up. Calender will be mailed.

## 2019-11-04 ENCOUNTER — Other Ambulatory Visit: Payer: Self-pay | Admitting: Hematology and Oncology

## 2019-11-16 ENCOUNTER — Telehealth: Payer: Self-pay | Admitting: *Deleted

## 2019-11-16 MED ORDER — FUROSEMIDE 20 MG PO TABS: 20.0000 mg | ORAL_TABLET | Freq: Every day | ORAL | 1 refills | Status: DC

## 2019-11-16 NOTE — Telephone Encounter (Signed)
Ms Kathleen Potter called to see if Dr Lindi Adie wants her to continue taking lasix. States she continues to have swelling in her legs- especially feet. Continues to take potassium and is limiting salt intake.

## 2019-11-16 NOTE — Telephone Encounter (Signed)
Notified to continue taking the lasix as long as there is swelling.

## 2019-11-30 ENCOUNTER — Other Ambulatory Visit: Payer: Self-pay | Admitting: Hematology and Oncology

## 2019-12-07 ENCOUNTER — Telehealth: Payer: Self-pay | Admitting: Internal Medicine

## 2019-12-07 NOTE — Telephone Encounter (Signed)
    Pt said she is getting a 2nd opinion and would like a CD copy of pt's TEE

## 2019-12-09 ENCOUNTER — Encounter (HOSPITAL_COMMUNITY): Payer: Self-pay | Admitting: Emergency Medicine

## 2019-12-09 ENCOUNTER — Emergency Department (HOSPITAL_COMMUNITY)
Admission: EM | Admit: 2019-12-09 | Discharge: 2019-12-10 | Disposition: A | Discharge status: Home / Self Care | Payer: BC Managed Care – PPO | Source: Home / Self Care

## 2019-12-09 ENCOUNTER — Other Ambulatory Visit: Payer: Self-pay

## 2019-12-09 DIAGNOSIS — R931 Abnormal findings on diagnostic imaging of heart and coronary circulation: Secondary | ICD-10-CM | POA: Diagnosis not present

## 2019-12-09 DIAGNOSIS — Z9221 Personal history of antineoplastic chemotherapy: Secondary | ICD-10-CM | POA: Diagnosis not present

## 2019-12-09 DIAGNOSIS — C50211 Malignant neoplasm of upper-inner quadrant of right female breast: Secondary | ICD-10-CM | POA: Diagnosis not present

## 2019-12-09 DIAGNOSIS — Z171 Estrogen receptor negative status [ER-]: Secondary | ICD-10-CM | POA: Diagnosis not present

## 2019-12-09 DIAGNOSIS — Z5321 Procedure and treatment not carried out due to patient leaving prior to being seen by health care provider: Secondary | ICD-10-CM | POA: Insufficient documentation

## 2019-12-09 DIAGNOSIS — Z923 Personal history of irradiation: Secondary | ICD-10-CM | POA: Diagnosis not present

## 2019-12-09 DIAGNOSIS — G620 Drug-induced polyneuropathy: Secondary | ICD-10-CM | POA: Diagnosis not present

## 2019-12-09 DIAGNOSIS — R42 Dizziness and giddiness: Secondary | ICD-10-CM | POA: Insufficient documentation

## 2019-12-09 LAB — BASIC METABOLIC PANEL
Anion gap: 8 (ref 5–15)
BUN: 12 mg/dL (ref 8–23)
CO2: 24 mmol/L (ref 22–32)
Calcium: 9 mg/dL (ref 8.9–10.3)
Chloride: 106 mmol/L (ref 98–111)
Creatinine, Ser: 0.84 mg/dL (ref 0.44–1.00)
GFR calc Af Amer: >60 mL/min (ref >60)
GFR calc non Af Amer: >60 mL/min (ref >60)
Glucose, Bld: 92 mg/dL (ref 70–99)
Potassium: 3.8 mmol/L (ref 3.5–5.1)
Sodium: 138 mmol/L (ref 135–145)

## 2019-12-09 LAB — CBC
HCT: 37.5 % (ref 36.0–46.0)
Hemoglobin: 11.6 g/dL — ABNORMAL LOW (ref 12.0–15.0)
MCH: 22.7 pg — ABNORMAL LOW (ref 26.0–34.0)
MCHC: 30.9 g/dL (ref 30.0–36.0)
MCV: 73.2 fL — ABNORMAL LOW (ref 80.0–100.0)
Platelets: 193 10*3/uL (ref 150–400)
RBC: 5.12 MIL/uL — ABNORMAL HIGH (ref 3.87–5.11)
RDW: 16.3 % — ABNORMAL HIGH (ref 11.5–15.5)
WBC: 5.8 10*3/uL (ref 4.0–10.5)
nRBC: 0 % (ref 0.0–0.2)

## 2019-12-09 MED ORDER — SODIUM CHLORIDE 0.9% FLUSH: 3.0000 mL | Freq: Once | INTRAVENOUS | Status: DC

## 2019-12-09 NOTE — ED Triage Notes (Signed)
Pt reports syncopal episode on Friday after carrying groceries in from car.  States it was hot outside.  Sat down inside and states she passed out. When she woke up she had urinary incontinence.  Was at home by herself.  Denies pain.  Denies weakness/dizziness.  Denies fall.  No complaints at this time.

## 2019-12-09 NOTE — ED Notes (Signed)
Pt did not answer for room. 

## 2019-12-09 NOTE — Progress Notes (Signed)
SURVIVORSHIP VISIT:   BRIEF ONCOLOGIC HISTORY:  Oncology History  Malignant neoplasm of upper-inner quadrant of right breast in female, estrogen receptor negative ([place])  01/02/2019 Initial Diagnosis   Routine screening mammogram detected a 1.2cm mass in the right breast at the 12:30 location 7 cm from the nipple, no axillary adenopathy. Biopsy showed IDC, grade 2-3, HER-2 - (1+), ER -, PR -, Ki67 80%.    01/03/2019 Cancer Staging   Staging form: Breast, AJCC 8th Edition - Clinical stage from 01/03/2019: Stage IB (cT1c, cN0, cM0, G3, ER-, PR-, HER2-)    03/01/2019 Surgery   Right breast lumpectomy ([facility]) ([id number]): IDC, grade 3, 1.4cm, clear margins, 3 lymph nodes negative for carcinoma.    03/09/2019 Cancer Staging   Staging form: Breast, AJCC 8th Edition - Pathologic stage from 03/09/2019: Stage IB (pT1c, pN0, cM0, G3, ER-, PR-, HER2-)    03/27/2019 - 07/17/2019 Chemotherapy   DOXOrubicin (ADRIAMYCIN) chemo injection 118 mg, 50 mg/m2 = 118 mg (83.3 % of original dose 60 mg/m2), Intravenous,  Once, 4 of 4 cycles. Dose modification: 50 mg/m2 (original dose 60 mg/m2, Cycle 1, Reason: Provider Judgment), 45 mg/m2 (original dose 60 mg/m2, Cycle 3, Reason: Provider Judgment) Administration: 118 mg (03/27/2019), 118 mg (04/10/2019), 106 mg (04/24/2019), 106 mg (05/08/2019)  palonosetron (ALOXI) injection 0.25 mg, 0.25 mg, Intravenous,  Once, 12 of 15 cycles Administration: 0.25 mg (03/27/2019), 0.25 mg (04/10/2019), 0.25 mg (04/24/2019), 0.25 mg (05/08/2019), 0.25 mg (05/29/2019), 0.25 mg (06/06/2019), 0.25 mg (06/12/2019), 0.25 mg (06/19/2019), 0.25 mg (06/26/2019), 0.25 mg (07/03/2019), 0.25 mg (07/10/2019), 0.25 mg (07/17/2019)  pegfilgrastim-jmdb (FULPHILA) injection 6 mg, 6 mg, Subcutaneous,  Once, 4 of 4 cycles. Administration: 6 mg (03/29/2019), 6 mg (04/12/2019), 6 mg (04/26/2019), 6 mg (05/10/2019)  cyclophosphamide (CYTOXAN) 1,180 mg in sodium chloride 0.9 % 250 mL chemo infusion, 500 mg/m2 =  1,180 mg (83.3 % of original dose 600 mg/m2), Intravenous,  Once, 4 of 4 cycles Dose modification: 500 mg/m2 (original dose 600 mg/m2, Cycle 1, Reason: Provider Judgment), 450 mg/m2 (original dose 600 mg/m2, Cycle 3, Reason: Provider Judgment) Administration: 1,180 mg (03/27/2019), 1,180 mg (04/10/2019), 1,060 mg (04/24/2019), 1,060 mg (05/08/2019)  PACLitaxel (TAXOL) 192 mg in sodium chloride 0.9 % 250 mL chemo infusion (</= 62m/m2), 80 mg/m2 = 192 mg, Intravenous,  Once, 9 of 12 cycles Dose modification: 65 mg/m2 (original dose 80 mg/m2, Cycle 12, Reason: Dose not tolerated) Administration: 192 mg (05/22/2019), 192 mg (05/29/2019), 192 mg (06/06/2019), 192 mg (06/12/2019), 192 mg (06/19/2019), 192 mg (06/26/2019), 192 mg (07/03/2019), 156 mg (07/10/2019), 156 mg (07/17/2019)  fosaprepitant (EMEND) 150 mg, dexamethasone (DECADRON) 12 mg in sodium chloride 0.9 % 145 mL IVPB, , Intravenous,  Once, 4 of 4 cycles Administration:  (03/27/2019),  (04/10/2019),  (04/24/2019),  (05/08/2019)   08/15/2019 - 09/11/2019 Radiation Therapy   The patient initially received a dose of 42.56 Gy in 16 fractions to the breast using whole-breast tangent fields. This was delivered using a 3-D conformal technique. The patient then received a boost to the seroma. This delivered an additional 10 Gy in 383fctions using an en face electron field due to the depth of the seroma. The total dose was 52.56Gy.     INTERVAL HISTORY:  Kathleen Potter review her survivorship care plan detailing her treatment course for breast cancer, as well as monitoring long-term side effects of that treatment, education regarding health maintenance, screening, and overall wellness and health promotion.     Overall, Kathleen Potter feeling quite well.  She had a fainting spell on Friday.  She ntoes that she was outside, and hot, and hadn't drank enough water.  She noted that things got blurry and dark and she went to sit down and did.  She woke up and had  urinated on the floor.  She went to the ER for evaluation, however left after reportedly being there for 8 hours with no room.  She completed the survivorship survey.  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  She notes that she has an atrial mass that showed up on the RA and IVC junction on TEE on 5/25 and she was started on Apixaban at that time.  She has continued on Apixaban BID and she tolerates it well.    REVIEW OF SYSTEMS:  Review of Systems  Constitutional: Negative for appetite change, chills, fatigue and unexpected weight change.  HENT:   Negative for hearing loss, lump/mass and sore throat.   Eyes: Negative for eye problems and icterus.  Respiratory: Negative for chest tightness, cough and shortness of breath.   Cardiovascular: Negative for chest pain, leg swelling and palpitations.  Gastrointestinal: Negative for abdominal distention, abdominal pain, blood in stool, constipation, diarrhea, nausea and vomiting.  Endocrine: Negative for hot flashes.  Genitourinary: Negative for difficulty urinating.   Musculoskeletal: Negative for arthralgias.  Skin: Negative for itching and rash.  Neurological: Negative for dizziness, extremity weakness, light-headedness and numbness.  Hematological: Negative for adenopathy. Does not bruise/bleed easily.  Psychiatric/Behavioral: Negative for depression and sleep disturbance. The patient is not nervous/anxious.   Breast: Denies any new nodularity, masses, tenderness, nipple changes, or nipple discharge.      ONCOLOGY TREATMENT TEAM:  1. Surgeon:  Dr. Brantley Stage at [facility] 2. Medical Oncologist: Dr. Lindi Adie  3. Radiation Oncologist: Dr. Lisbeth Renshaw    PAST MEDICAL/SURGICAL HISTORY:  Past Medical History:  Diagnosis Date  . Family history of  breast cancer   . Family history of prostate cancer   . Hypertension    Pt states her BP has been running high lately.   Past Surgical History:  Procedure Laterality Date  . BREAST BIOPSY Right   . BREAST LUMPECTOMY WITH RADIOACTIVE SEED AND SENTINEL LYMPH NODE BIOPSY Right 03/01/2019   Procedure: RIGHT BREAST LUMPECTOMY WITH RADIOACTIVE SEED;  Surgeon: Erroll Luna, MD;  Location: [place];  Service: General;  Laterality: Right;  . PARTIAL HYSTERECTOMY    . PORTACATH PLACEMENT N/A 03/01/2019   Procedure: INSERTION PORT-A-CATH WITH ULTRASOUND;  Surgeon: Erroll Luna, MD;  Location: [place];  Service: General;  Laterality: N/A;  . SENTINEL NODE BIOPSY Right 03/01/2019   Procedure: Right Sentinel Lymph Node Biopsy;  Surgeon: Erroll Luna, MD;  Location: [place];  Service: General;  Laterality: Right;  . TEE WITHOUT CARDIOVERSION N/A 10/16/2019   Procedure: TRANSESOPHAGEAL ECHOCARDIOGRAM (TEE);  Surgeon: Pixie Casino, MD;  Location: [facility] ENDOSCOPY;  Service: Cardiovascular;  Laterality: N/A;     ALLERGIES:  Allergies  Allergen Reactions  . Latex Itching     CURRENT MEDICATIONS:  Facility-Administered Encounter Medications as of 12/10/2019  Medication  . sodium chloride flush (NS) 0.9 % injection 3 mL   Outpatient Encounter Medications as of 12/10/2019  Medication Sig  . apixaban (ELIQUIS) 5 MG TABS tablet Take 1 tablet (5 mg total) by mouth 2 (two) times  daily.  . Cholecalciferol (VITAMIN D) 50 MCG (2000 UT) tablet Take 2,000 Units by mouth daily.  . Cyanocobalamin (B-12-SL) 1000 MCG SUBL Place 1,000 mcg under the tongue daily.  . furosemide (LASIX) 20 MG tablet Take 1 tablet (20 mg total) by mouth daily.  Marland Kitchen ibuprofen (ADVIL) 800 MG tablet Take 800 mg by mouth every 8 (eight) hours as needed for moderate pain.  Marland Kitchen lidocaine-prilocaine (EMLA) cream Apply to affected area once (Patient not taking: Reported on 10/12/2019)  . potassium chloride (KLOR-CON) 10 MEQ tablet TAKE 1 TABLET BY  MOUTH EVERY DAY  . pyridOXINE (VITAMIN B-6) 100 MG tablet Take 100 mg by mouth 3 (three) times a week.  . TURMERIC CURCUMIN PO Take 1 tablet by mouth 3 (three) times a week.  . Wound Dressings (SONAFINE EX) Apply 1 application topically daily as needed (radiation burn).     ONCOLOGIC FAMILY HISTORY:  Family History  Problem Relation Age of Onset  . Breast cancer Sister 10  . Prostate cancer Paternal Uncle        dx. early-mid 42s, not metastatic     GENETIC COUNSELING/TESTING: Not at this time  SOCIAL HISTORY:  Social History   Socioeconomic History  . Marital status: Single    Spouse name: Not on file  . Number of children: Not on file  . Years of education: Not on file  . Highest education level: Not on file  Occupational History  . Not on file  Tobacco Use  . Smoking status: Never Smoker  . Smokeless tobacco: Never Used  Vaping Use  . Vaping Use: Never used  Substance and Sexual Activity  . Alcohol use: Not Currently  . Drug use: Never  . Sexual activity: Not on file  Other Topics Concern  . Not on file  Social History Narrative  . Not on file   Social Determinants of Health   Financial Resource Strain:   . Difficulty of Paying Living Expenses:   Food Insecurity:   . Worried About Charity fundraiser in the Last Year:   . Arboriculturist in the Last Year:   Transportation Needs:   . Film/video editor (Medical):   Marland Kitchen Lack of Transportation (Non-Medical):   Physical Activity:   . Days of Exercise per Week:   . Minutes of Exercise per Session:   Stress:   . Feeling of Stress :   Social Connections:   . Frequency of Communication with Friends and Family:   . Frequency of Social Gatherings with Friends and Family:   . Attends Religious Services:   . Active Member of Clubs or Organizations:   . Attends Archivist Meetings:   Marland Kitchen Marital Status:   Intimate Partner Violence:   . Fear of Current or Ex-Partner:   . Emotionally Abused:   Marland Kitchen  Physically Abused:   . Sexually Abused:      OBSERVATIONS/OBJECTIVE:  BP 118/77 (BP Location: Left Arm, Patient Position: Sitting)   Pulse 95   Temp 98.9 F (37.2 C) (Temporal)   Resp 18   Ht 5' 6" (1.676 m)   Wt 247 lb 12.8 oz (112.4 kg)   SpO2 100%   BMI 40.00 kg/m  GENERAL: Patient is a well appearing female in no acute distress HEENT:  Sclerae anicteric.  Oropharynx clear and moist. No ulcerations or evidence of oropharyngeal candidiasis. Neck is supple.  NODES:  No cervical, supraclavicular, or axillary lymphadenopathy palpated.  BREAST EXAM:  Right breast s/p  lumpectomy and radiation, no sign of local recurrence, left breast benign LUNGS:  Clear to auscultation bilaterally.  No wheezes or rhonchi. HEART:  Regular rate and rhythm. No murmur appreciated. ABDOMEN:  Soft, nontender.  Positive, normoactive bowel sounds. No organomegaly palpated. MSK:  No focal spinal tenderness to palpation. Full range of motion bilaterally in the upper extremities. EXTREMITIES:  No peripheral edema.   SKIN:  Clear with no obvious rashes or skin changes. No nail dyscrasia. NEURO:  Nonfocal. Well oriented.  Appropriate affect.    LABORATORY DATA:  None for this visit.  DIAGNOSTIC IMAGING:  None for this visit.      ASSESSMENT AND PLAN:  Kathleen Potter is a pleasant 64 y.o. female with Stage IB right breast invasive ductal carcinoma, ER-/PR-/HER2-, diagnosed in 12/2018, treated with lumpectomy, adjuvant chemotherapy, and adjuvant radiation therapy.  She presents to the Survivorship Clinic for our initial meeting and routine follow-up post-completion of treatment for breast cancer.    1. Stage IB right breast cancer:  Kathleen Potter is continuing to recover from definitive treatment for breast cancer. She will follow-up with her medical oncologist, Dr. Lindi Adie in 03/2020 with history and physical exam per surveillance protocol.  Her mammogram is due 02/2020; orders placed today.   Today, a  comprehensive survivorship care plan and treatment summary was reviewed with the patient today detailing her breast cancer diagnosis, treatment course, potential late/long-term effects of treatment, appropriate follow-up care with recommendations for the future, and patient education resources.  A copy of this summary, along with a letter will be sent to the patient's primary care provider via mail/fax/In Basket message after today's visit.    2. Atrial Mass: She has f/u with Dr. Debara Pickett in August with imaging to evaluate whether or not the blood thinner is improving it.    3. 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  She will call if this doesn't help and we will refer her to PT.    4. Bone health:  She was given education on specific activities to promote bone health.  5. Cancer screening:  Due to Kathleen Potter's history and her age, she should receive screening for skin cancers, colon cancer, and gynecologic cancers.  The information and recommendations are listed on the patient's comprehensive care plan/treatment summary and were reviewed in detail with the patient.    6. Health maintenance and wellness promotion: Kathleen Potter was encouraged to consume 5-7 servings of fruits and vegetables per day. We reviewed the "Nutrition Rainbow" handout, as well as the handout "Take Control of Your Health and Reduce Your Cancer Risk" from the [place].  She was also encouraged to engage in moderate to vigorous exercise for 30 minutes per day most days of the week. We discussed the [employer] fitness program, which is designed for cancer survivors to help them become more physically fit after cancer treatments.  She was instructed to limit her alcohol consumption and continue to abstain from tobacco use.     7. Support services/counseling: It is not uncommon for this period of the patient's cancer care trajectory to be one  of many emotions and stressors.  We discussed how this can be increasingly difficult during the times of quarantine and social distancing due to the COVID-19 pandemic.   She was given information regarding our available services and encouraged to contact me with any questions or for help enrolling in any of our support group/programs.  Follow up instructions:    -Return to cancer center in 12/2019 for f/u with Dr. Lindi Adie  -Mammogram due in 02/2020 -Follow up with surgery 01/2020 -She is welcome to return back to the Survivorship Clinic at any time; no additional follow-up needed at this time.  -Consider referral back to survivorship as a long-term survivor for continued surveillance  The patient was provided an opportunity to ask questions and all were answered. The patient agreed with the plan and demonstrated an understanding of the instructions.   Total encounter time: 45 minutes*  Wilber Bihari, NP 12/09/19 4:06 PM Medical Oncology and Hematology [facility] [place], [place] [address] Tel. [phone]    Fax. [phone]  *Total Encounter Time as defined by the Centers for Medicare and Medicaid Services includes, in addition to the face-to-face time of a patient visit (documented in the note above) non-face-to-face time: obtaining and reviewing outside history, ordering and reviewing medications, tests or procedures, care coordination (communications with other health care professionals or caregivers) and documentation in the medical record.

## 2019-12-09 NOTE — ED Notes (Signed)
Pt did not answer for vitals x3 

## 2019-12-10 ENCOUNTER — Other Ambulatory Visit: Payer: Self-pay

## 2019-12-10 ENCOUNTER — Telehealth: Payer: Self-pay | Admitting: Adult Health

## 2019-12-10 ENCOUNTER — Encounter: Payer: Self-pay | Admitting: Adult Health

## 2019-12-10 ENCOUNTER — Inpatient Hospital Stay: Payer: BC Managed Care – PPO | Attending: Hematology and Oncology | Admitting: Adult Health

## 2019-12-10 VITALS — BP 118/77 | HR 95 | Temp 98.9°F | Resp 18 | Ht 66.0 in | Wt 247.8 lb

## 2019-12-10 DIAGNOSIS — R931 Abnormal findings on diagnostic imaging of heart and coronary circulation: Secondary | ICD-10-CM | POA: Insufficient documentation

## 2019-12-10 DIAGNOSIS — Z9221 Personal history of antineoplastic chemotherapy: Secondary | ICD-10-CM | POA: Insufficient documentation

## 2019-12-10 DIAGNOSIS — C50211 Malignant neoplasm of upper-inner quadrant of right female breast: Secondary | ICD-10-CM | POA: Diagnosis not present

## 2019-12-10 DIAGNOSIS — Z171 Estrogen receptor negative status [ER-]: Secondary | ICD-10-CM | POA: Diagnosis not present

## 2019-12-10 DIAGNOSIS — Z923 Personal history of irradiation: Secondary | ICD-10-CM | POA: Insufficient documentation

## 2019-12-10 DIAGNOSIS — G620 Drug-induced polyneuropathy: Secondary | ICD-10-CM | POA: Insufficient documentation

## 2019-12-10 MED ORDER — DULOXETINE HCL 30 MG PO CPEP: ORAL_CAPSULE | ORAL | 5 refills | Status: DC

## 2019-12-10 NOTE — Telephone Encounter (Signed)
Left message with echo department at [address] to see if they can help with CD.

## 2019-12-10 NOTE — Telephone Encounter (Signed)
Called pt per 7/19 sch msg - no answer and no vmail set up- mailed letter with appt date and time

## 2019-12-12 ENCOUNTER — Telehealth: Payer: Self-pay | Admitting: Adult Health

## 2019-12-12 NOTE — Telephone Encounter (Signed)
No 7/19 los. No changes made to pt's schedule.

## 2019-12-13 NOTE — Telephone Encounter (Signed)
Spoke with pt, she is requesting the CD for a 2 nd option. Aware will need to get from the hospital and will let her know when we get it. Spoke to the echo department at [facility]. They will burn the CD and mail it to the patient. Patient made aware.

## 2019-12-17 ENCOUNTER — Encounter: Payer: Self-pay | Admitting: Internal Medicine

## 2019-12-17 ENCOUNTER — Other Ambulatory Visit: Payer: Self-pay

## 2019-12-17 ENCOUNTER — Ambulatory Visit (INDEPENDENT_AMBULATORY_CARE_PROVIDER_SITE_OTHER): Payer: BC Managed Care – PPO | Admitting: Internal Medicine

## 2019-12-17 VITALS — BP 115/66 | HR 68 | Ht 66.0 in | Wt 248.6 lb

## 2019-12-17 DIAGNOSIS — I5189 Other ill-defined heart diseases: Secondary | ICD-10-CM | POA: Diagnosis not present

## 2019-12-17 NOTE — Patient Instructions (Signed)
Medication Instructions:  No Changes *If you need a refill on your cardiac medications before your next appointment, please call your pharmacy*   Lab Work: None ordered If you have labs (blood work) drawn today and your tests are completely normal, you will receive your results only by: Marland Kitchen MyChart Message (if you have MyChart) OR . A paper copy in the mail If you have any lab test that is abnormal or we need to change your treatment, we will call you to review the results.   Testing/Procedures: None Ordered   Follow-Up: At [facility], you and your health needs are our priority.  As part of our continuing mission to provide you with exceptional heart care, we have created designated Provider Care Teams.  These Care Teams include your primary Cardiologist (physician) and Advanced Practice Providers (APPs -  Physician Assistants and Nurse Practitioners) who all work together to provide you with the care you need, when you need it.  We recommend signing up for the patient portal called "MyChart".  Sign up information is provided on this After Visit Summary.  MyChart is used to connect with patients for Virtual Visits (Telemedicine).  Patients are able to view lab/test results, encounter notes, upcoming appointments, etc.  Non-urgent messages can be sent to your provider as well.   To learn more about what you can do with MyChart, go to [email or web address].    Your next appointment:   As Needed  Provider:   You may see Dr. Debara [facility] or one of the following Advanced Practice Providers on your designated Care Team:    Almyra Deforest, PA-C  Fabian Sharp, [place] or   Roby Lofts, [place]    Other Instructions

## 2019-12-17 NOTE — Progress Notes (Signed)
OFFICE CONSULT NOTE  Chief Complaint:  Follow-up TEE  Primary Care Physician: Aretta Nip, MD  HPI:  Kathleen Potter is a 64 y.o. female who is being seen today for the evaluation of abnormal echo finding at the request of Rankins, Bill Salinas, MD.  This is a very pleasant 64 year old female who unfortunately has breast cancer and has been undergoing treatment for that.  She had an echocardiogram ordered by her oncologist because she has had some progressive lower extremity swelling.  She also had lower extremity venous Dopplers performed.  Painfully were negative for DVT however her echocardiogram was abnormal.  This was performed and interpreted today by the reading cardiologist Dr. Margaretann Loveless, who noted that the EF was normal 55 to 60% with grade 1 diastolic dysfunction.  There was a finding of a 1.5 x 1.35 cm echodensity in the right atrium possibly associated with a venous port catheter suggestive of thrombus or vegetation.  Attempts were made to arrange for possible TEE however no availability was made and ultimately she was scheduled for me as the doctor of the day at the [facility] line office to see.  Kathleen Potter feels well, denied chest pain or shortness of breath.  She has had no fever or chills, weight loss or other constitutional symptoms concerning for possible endocarditis.  She reports she had the port placed in October 2020 when she had lumpectomy and sentinel lymph node biopsy.  12/17/2019  Kathleen Potter returns today for follow-up of her TEE.  I discussed the findings with Dr. Quintella Reichert and recommended starting anticoagulation.  She has been anticoagulated without any side effects.  More recently she sought another opinion from Dr. Newman Pies at [facility].  It seems that he agrees that it is more likely that this was a thrombus in her right atrium.  He was going to try to review our echo images.  He had recommended a cardiac MRI to evaluate for resolution of the thrombus or mass and follow-up  with him.  Kathleen Potter intends to keep that follow-up appointment.  PMHx:  Past Medical History:  Diagnosis Date  . Family history of breast cancer   . Family history of prostate cancer   . Hypertension    Pt states her BP has been running high lately.    Past Surgical History:  Procedure Laterality Date  . BREAST BIOPSY Right   . BREAST LUMPECTOMY WITH RADIOACTIVE SEED AND SENTINEL LYMPH NODE BIOPSY Right 03/01/2019   Procedure: RIGHT BREAST LUMPECTOMY WITH RADIOACTIVE SEED;  Surgeon: Erroll Luna, MD;  Location: [place];  Service: General;  Laterality: Right;  . PARTIAL HYSTERECTOMY    . PORTACATH PLACEMENT N/A 03/01/2019   Procedure: INSERTION PORT-A-CATH WITH ULTRASOUND;  Surgeon: Erroll Luna, MD;  Location: [place];  Service: General;  Laterality: N/A;  . SENTINEL NODE BIOPSY Right 03/01/2019   Procedure: Right Sentinel Lymph Node Biopsy;  Surgeon: Erroll Luna, MD;  Location: [place];  Service: General;  Laterality: Right;  . TEE WITHOUT CARDIOVERSION N/A 10/16/2019   Procedure: TRANSESOPHAGEAL ECHOCARDIOGRAM (TEE);  Surgeon: Pixie Casino, MD;  Location: [facility] END[facility]COPY;  Service: Cardiovascular;  Laterality: N/A;    FAMHx:  Family History  Problem Relation Age of Onset  . Breast cancer Sister 10  . Prostate cancer Paternal Uncle        dx. early-mid 28s, not metastatic    SOCHx:   reports that she has never smoked. She has never used smokeless tobacco. She reports previous alcohol use.  She reports that she does not use drugs.  ALLERGIES:  Allergies  Allergen Reactions  . Latex Itching    ROS: Pertinent items noted in HPI and remainder of comprehensive ROS otherwise negative.  HOME MEDS: Current Outpatient Medications on File Prior to Visit  Medication Sig Dispense Refill  . apixaban (ELIQUIS) 5 MG TABS tablet Take 1 tablet (5 mg total) by mouth 2 (two) times daily. 60 tablet 2  . Cholecalciferol (VITAMIN D) 50 MCG (2000 UT) tablet Take 2,000 Units by mouth  daily.    . Cyanocobalamin (B-12-SL) 1000 MCG SUBL Place 1,000 mcg under the tongue daily.    . DULoxetine (CYMBALTA) 30 MG capsule Take 1 tablet daily x 1-2 weeks, then increase to 1 tablet bid. 60 capsule 5  . furosemide (LASIX) 20 MG tablet Take 1 tablet (20 mg total) by mouth daily. 30 tablet 1  . ibuprofen (ADVIL) 800 MG tablet Take 800 mg by mouth every 8 (eight) hours as needed for moderate pain.    Marland Kitchen lidocaine-prilocaine (EMLA) cream Apply to affected area once 30 g 3  . potassium chloride (KLOR-CON) 10 MEQ tablet TAKE 1 TABLET BY MOUTH EVERY DAY 30 tablet 0  . pyridOXINE (VITAMIN B-6) 100 MG tablet Take 100 mg by mouth 3 (three) times a week.    . TURMERIC CURCUMIN PO Take 1 tablet by mouth 3 (three) times a week.    . Wound Dressings (SONAFINE EX) Apply 1 application topically daily as needed (radiation burn).     No current facility-administered medications on file prior to visit.    LABS/IMAGING: No results found for this or any previous visit (from the past 48 hour(s)). No results found.  LIPID PANEL: No results found for: CHOL, TRIG, HDL, CHOLHDL, VLDL, LDLCALC, LDLDIRECT  WEIGHTS: Wt Readings from Last 3 Encounters:  12/17/19 (!) 248 lb 9.6 oz (112.8 kg)  12/10/19 247 lb 12.8 oz (112.4 kg)  12/09/19 246 lb (111.6 kg)    VITALS: BP 115/66   Pulse 68   Ht 5\' 6"  (1.676 m)   Wt (!) 248 lb 9.6 oz (112.8 kg)   SpO2 99%   BMI 40.13 kg/m   EXAM: General appearance: alert and no distress Neck: no carotid bruit, no JVD and thyroid not enlarged, symmetric, no tenderness/mass/nodules Lungs: clear to auscultation bilaterally Heart: regular rate and rhythm Abdomen: soft, non-tender; bowel sounds normal; no masses,  no organomegaly Extremities: extremities normal, atraumatic, no cyanosis or edema Pulses: 2+ and symmetric Skin: Skin color, texture, turgor normal. No rashes or lesions Neurologic: Grossly normal Psych: Pleasant  EKG: Normal sinus rhythm 68-personally  reviewed  ASSESSMENT: 1. 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 Breast cancer-undergoing therapy 3. Bilateral leg edema  PLAN: 1.   Kathleen Potter has a large mass on her TEE which is suspicious for thrombus.  She is now been anticoagulated for at least 2 months.  She saw cardiologist to [facility] for second opinion who also felt that this is likely thrombus.  A cardiac MRI has been ordered with the plan follow-up there.  Ultimately if it resolves, I would advise continuing her anticoagulation until her port is removed.  She will need to follow-up with Dr. Lindi Adie which is scheduled in August to discuss this further.  Follow-up with me as needed.  Pixie Casino, MD, [facility], [place] Director of the Advanced Lipid Disorders &  Cardiovascular Risk Reduction Clinic Diplomate of the  American Board of Clinical Lipidology Attending Cardiologist  Direct Dial: [phone]  Fax: [phone]  Website:  [email or web address]  Nadean Corwin Genella Bas 12/17/2019, 10:37 AM

## 2019-12-21 ENCOUNTER — Other Ambulatory Visit: Payer: Self-pay | Admitting: *Deleted

## 2019-12-21 MED ORDER — FUROSEMIDE 20 MG PO TABS: 20.0000 mg | ORAL_TABLET | Freq: Every day | ORAL | 1 refills | Status: DC

## 2020-01-09 ENCOUNTER — Telehealth: Payer: Self-pay | Admitting: Internal Medicine

## 2020-01-09 NOTE — Telephone Encounter (Signed)
Patient is calling to get advise from Dr. Debara [place] on her Eliquis. She states that she has hemorrhoids and that she had a break through bleed this morning, the bleeding finally stopped. But is wanting to know if Dr. Debara [facility] wants her to continue with the Eliquis. Please advise.

## 2020-01-09 NOTE — Telephone Encounter (Signed)
Kathleen Casino, MD  You 1 minute ago (9:25 AM)     She is being followed by a cardiologist at [facility] now - they did an MRI recently which shows persistent right atrial mass. Would recommend she contact that cardiologist regarding duration of anticoagulation, but probably stay on it for now. Should add stool softners to lessen the chance of hemorrhoidal bleeding and possibly see PCP or GI if bleeding starts again.  Dr. Debara [facility]     Patient made aware of the above from Dr. Debara [facility]. She verbalized understanding.

## 2020-01-09 NOTE — Telephone Encounter (Signed)
She is being followed by a cardiologist at [facility] now - they did an MRI recently which shows persistent right atrial mass. Would recommend she contact that cardiologist regarding duration of anticoagulation, but probably stay on it for now. Should add stool softners to lessen the chance of hemorrhoidal bleeding and possibly see PCP or GI if bleeding starts again.  Dr. Debara [facility]

## 2020-01-11 ENCOUNTER — Other Ambulatory Visit: Payer: Self-pay | Admitting: Internal Medicine

## 2020-01-11 NOTE — Telephone Encounter (Signed)
Prescription refill request for Eliquis received. Indication: DVT Last office visit: 12/17/2019 Hilty Scr: 0.84 12/09/2019 Age: 64 Weight: 112.8 kg  Prescription refilled

## 2020-01-20 NOTE — Progress Notes (Signed)
Patient Care Team: Rankins, Bill Salinas, MD as PCP - General (Family Medicine) Erroll Luna, MD as Consulting Physician (General Surgery) Nicholas Lose, MD as Consulting Physician (Hematology and Oncology) Kyung Rudd, MD as Consulting Physician (Radiation Oncology)  DIAGNOSIS:    ICD-10-CM   1. Malignant neoplasm of upper-inner quadrant of right breast in female, estrogen receptor negative ([place])  C50.211    Z17.1     SUMMARY OF ONCOLOGIC HISTORY: Oncology History  Malignant neoplasm of upper-inner quadrant of right breast in female, estrogen receptor negative ([place])  01/02/2019 Initial Diagnosis   Routine screening mammogram detected a 1.2cm mass in the right breast at the 12:30 location 7 cm from the nipple, no axillary adenopathy. Biopsy showed IDC, grade 2-3, HER-2 - (1+), ER -, PR -, Ki67 80%.    01/03/2019 Cancer Staging   Staging form: Breast, AJCC 8th Edition - Clinical stage from 01/03/2019: Stage IB (cT1c, cN0, cM0, G3, ER-, PR-, HER2-)    03/01/2019 Surgery   Right breast lumpectomy ([place]) ([id number]): IDC, grade 3, 1.4cm, clear margins, 3 lymph nodes negative for carcinoma.    03/09/2019 Cancer Staging   Staging form: Breast, AJCC 8th Edition - Pathologic stage from 03/09/2019: Stage IB (pT1c, pN0, cM0, G3, ER-, PR-, HER2-)    03/27/2019 - 07/17/2019 Chemotherapy   DOXOrubicin (ADRIAMYCIN) chemo injection 118 mg, 50 mg/m2 = 118 mg (83.3 % of original dose 60 mg/m2), Intravenous,  Once, 4 of 4 cycles. Dose modification: 50 mg/m2 (original dose 60 mg/m2, Cycle 1, Reason: Provider Judgment), 45 mg/m2 (original dose 60 mg/m2, Cycle 3, Reason: Provider Judgment) Administration: 118 mg (03/27/2019), 118 mg (04/10/2019), 106 mg (04/24/2019), 106 mg (05/08/2019)  palonosetron (ALOXI) injection 0.25 mg, 0.25 mg, Intravenous,  Once, 12 of 15 cycles Administration: 0.25 mg (03/27/2019), 0.25 mg (04/10/2019), 0.25 mg (04/24/2019), 0.25 mg (05/08/2019), 0.25 mg (05/29/2019), 0.25 mg  (06/06/2019), 0.25 mg (06/12/2019), 0.25 mg (06/19/2019), 0.25 mg (06/26/2019), 0.25 mg (07/03/2019), 0.25 mg (07/10/2019), 0.25 mg (07/17/2019)  pegfilgrastim-jmdb (FULPHILA) injection 6 mg, 6 mg, Subcutaneous,  Once, 4 of 4 cycles. Administration: 6 mg (03/29/2019), 6 mg (04/12/2019), 6 mg (04/26/2019), 6 mg (05/10/2019)  cyclophosphamide (CYTOXAN) 1,180 mg in sodium chloride 0.9 % 250 mL chemo infusion, 500 mg/m2 = 1,180 mg (83.3 % of original dose 600 mg/m2), Intravenous,  Once, 4 of 4 cycles Dose modification: 500 mg/m2 (original dose 600 mg/m2, Cycle 1, Reason: Provider Judgment), 450 mg/m2 (original dose 600 mg/m2, Cycle 3, Reason: Provider Judgment) Administration: 1,180 mg (03/27/2019), 1,180 mg (04/10/2019), 1,060 mg (04/24/2019), 1,060 mg (05/08/2019)  PACLitaxel (TAXOL) 192 mg in sodium chloride 0.9 % 250 mL chemo infusion (</= 76m/m2), 80 mg/m2 = 192 mg, Intravenous,  Once, 9 of 12 cycles Dose modification: 65 mg/m2 (original dose 80 mg/m2, Cycle 12, Reason: Dose not tolerated) Administration: 192 mg (05/22/2019), 192 mg (05/29/2019), 192 mg (06/06/2019), 192 mg (06/12/2019), 192 mg (06/19/2019), 192 mg (06/26/2019), 192 mg (07/03/2019), 156 mg (07/10/2019), 156 mg (07/17/2019)  fosaprepitant (EMEND) 150 mg, dexamethasone (DECADRON) 12 mg in sodium chloride 0.9 % 145 mL IVPB, , Intravenous,  Once, 4 of 4 cycles Administration:  (03/27/2019),  (04/10/2019),  (04/24/2019),  (05/08/2019)   08/15/2019 - 09/11/2019 Radiation Therapy   The patient initially received a dose of 42.56 Gy in 16 fractions to the breast using whole-breast tangent fields. This was delivered using a 3-D conformal technique. The patient then received a boost to the seroma. This delivered an additional 10 Gy in 349fctions using an en face electron  field due to the depth of the seroma. The total dose was 52.56Gy.     CHIEF COMPLIANT: Surveillance of triple negative right breast cancer  INTERVAL HISTORY: [name] is a 64 y.o. with  above-mentioned history of triple negative right breast cancer who underwent a lumpectomy,adjuvant chemotherapy, radiation, and is currently on surviellance. Echo on 10/12/19 showed a blood clot at the tip of her port and she has been on Eliquis for 3 months. She presents to the clinic today for follow-up.  Patient is very frustrated about the right atrial thrombus/mass.  She has seen [facility] who performed MRI but did not tell her if the size of the thrombus is any smaller than before.  She has another appointment in 3 months to discuss.  She has been on blood thinners for the past 3 months.  ALLERGIES:  is allergic to latex.  MEDICATIONS:  Current Outpatient Medications  Medication Sig Dispense Refill  . Cholecalciferol (VITAMIN D) 50 MCG (2000 UT) tablet Take 2,000 Units by mouth daily.    . Cyanocobalamin (B-12-SL) 1000 MCG SUBL Place 1,000 mcg under the tongue daily.    . DULoxetine (CYMBALTA) 30 MG capsule Take 1 tablet daily x 1-2 weeks, then increase to 1 tablet bid. 60 capsule 5  . ELIQUIS 5 MG TABS tablet TAKE 1 TABLET BY MOUTH TWICE A DAY 60 tablet 5  . furosemide (LASIX) 20 MG tablet Take 1 tablet (20 mg total) by mouth daily. 30 tablet 1  . ibuprofen (ADVIL) 800 MG tablet Take 800 mg by mouth every 8 (eight) hours as needed for moderate pain.    Marland Kitchen lidocaine-prilocaine (EMLA) cream Apply to affected area once 30 g 3  . potassium chloride (KLOR-CON) 10 MEQ tablet TAKE 1 TABLET BY MOUTH EVERY DAY 30 tablet 0  . pyridOXINE (VITAMIN B-6) 100 MG tablet Take 100 mg by mouth 3 (three) times a week.    . TURMERIC CURCUMIN PO Take 1 tablet by mouth 3 (three) times a week.    . Wound Dressings (SONAFINE EX) Apply 1 application topically daily as needed (radiation burn).     No current facility-administered medications for this visit.    PHYSICAL EXAMINATION: ECOG PERFORMANCE STATUS: 1 - Symptomatic but completely ambulatory  Vitals:   01/21/20 1448  BP: 127/73  Pulse: 70  Resp: 18  Temp:  (!) 97.2 F (36.2 C)  SpO2: 100%   Filed Weights   01/21/20 1448  Weight: 247 lb 6.4 oz (112.2 kg)    LABORATORY DATA:  I have reviewed the data as listed CMP Latest Ref Rng & Units 01/21/2020 12/09/2019 10/15/2019  Glucose 70 - 99 mg/dL 91 92 95  BUN 8 - 23 mg/dL _0 Creatinine 0.44 - 1.00 mg/dL 0.91 0.84 0.86  Sodium 135 - 145 mmol/L 139 138 145  Potassium 3.5 - 5.1 mmol/L 3.8 3.8 3.8  Chloride 98 - 111 mmol/L 105 106 111  CO2 22 - 32 mmol/L _1 Calcium 8.9 - 10.3 mg/dL 10.0 9.0 8.8(L)  Total Protein 6.5 - 8.1 g/dL 7.4 - -  Total Bilirubin 0.3 - 1.2 mg/dL 0.4 - -  Alkaline Phos 38 - 126 U/L 82 - -  AST 15 - 41 U/L 25 - -  ALT 0 - 44 U/L 27 - -    Lab Results  Component Value Date   WBC 4.4 01/21/2020   HGB 12.5 01/21/2020   HCT 40.1 01/21/2020   MCV 74.3 (L) 01/21/2020  PLT 191 01/21/2020   NEUTROABS 2.6 01/21/2020    ASSESSMENT & PLAN:  Malignant neoplasm of upper-inner quadrant of right breast in female, estrogen receptor negative ([place]) 01/02/2019:Routine screening mammogram detected a 1.2cm mass in the right breast at the 12:30 location 7 cm from the nipple, no axillary adenopathy. Biopsy showed IDC, grade 2-3, HER-2 - (1+), ER -, PR -, Ki67 80%.  03/01/2019: Right lumpectomy: Grade 3 IDC, 1.4 cm, margins clear, 0/3 lymph nodes negative, triple negative stage IIb  Treatment plan: 1.Adjuvant chemotherapy with dose dense Adriamycin and Cytoxan x4 followed by Taxol weekly x9 (completed February 2021) 2.Followed by radiation therapycompleted 09/11/2019  Patient enrolled in SWOG 1714 clinical trial ----------------------------------------------------------------------------------------------------------------------------------------------------- Surveillance:  Breast exams 10/15/2019: Benign Mammograms: Have been ordered but patient does not want to do the mammograms until the port is removed.  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 I encouraged her to exercise and walk regularly. The neuropathy symptoms are getting better with time.  Lower extremity edema: No clear etiology.  Currently on Eliquis because of the blood clot at the tip of the port. 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  Could be thrombus or tumor. TEE August 2021 at [facility]: Presence of the right atrial mass is still detected but the did not measure it. Patient is being followed at [facility] cardiology for this. I discussed with her that we can remove the port since it is not connected to the mass. However she wants to wait for the cardiologist to give her the clearance.  She will continue 3 more months of anticoagulation.  I will arrange for her to get the port flushes. Return to clinic in December for follow-up    No orders of the defined types were placed in this encounter.  The patient has a good understanding of the overall plan. she agrees with it. she will call with any problems that may develop before the next visit here.  Total time spent: 30 mins including face to face time and time spent for planning, charting and coordination of care  Nicholas Lose, MD 01/21/2020  I, Cloyde Reams Dorshimer, am acting as scribe for Dr. Nicholas Lose.  I have reviewed the above documentation for accuracy and completeness, and I agree with the above.

## 2020-01-21 ENCOUNTER — Other Ambulatory Visit: Payer: Self-pay | Admitting: Genetic Counselor

## 2020-01-21 ENCOUNTER — Encounter: Payer: Self-pay | Admitting: Genetic Counselor

## 2020-01-21 ENCOUNTER — Inpatient Hospital Stay: Payer: BC Managed Care – PPO | Attending: Hematology and Oncology | Admitting: Hematology and Oncology

## 2020-01-21 ENCOUNTER — Other Ambulatory Visit: Payer: Self-pay

## 2020-01-21 ENCOUNTER — Inpatient Hospital Stay: Payer: BC Managed Care – PPO

## 2020-01-21 ENCOUNTER — Inpatient Hospital Stay (HOSPITAL_BASED_OUTPATIENT_CLINIC_OR_DEPARTMENT_OTHER): Payer: BC Managed Care – PPO | Admitting: Genetic Counselor

## 2020-01-21 DIAGNOSIS — Z315 Encounter for genetic counseling: Secondary | ICD-10-CM

## 2020-01-21 DIAGNOSIS — G620 Drug-induced polyneuropathy: Secondary | ICD-10-CM | POA: Insufficient documentation

## 2020-01-21 DIAGNOSIS — Z7901 Long term (current) use of anticoagulants: Secondary | ICD-10-CM | POA: Insufficient documentation

## 2020-01-21 DIAGNOSIS — C50211 Malignant neoplasm of upper-inner quadrant of right female breast: Secondary | ICD-10-CM

## 2020-01-21 DIAGNOSIS — Z803 Family history of malignant neoplasm of breast: Secondary | ICD-10-CM

## 2020-01-21 DIAGNOSIS — Z8042 Family history of malignant neoplasm of prostate: Secondary | ICD-10-CM | POA: Diagnosis not present

## 2020-01-21 DIAGNOSIS — Z923 Personal history of irradiation: Secondary | ICD-10-CM | POA: Diagnosis not present

## 2020-01-21 DIAGNOSIS — I513 Intracardiac thrombosis, not elsewhere classified: Secondary | ICD-10-CM | POA: Insufficient documentation

## 2020-01-21 DIAGNOSIS — Z171 Estrogen receptor negative status [ER-]: Secondary | ICD-10-CM

## 2020-01-21 DIAGNOSIS — Z9221 Personal history of antineoplastic chemotherapy: Secondary | ICD-10-CM | POA: Insufficient documentation

## 2020-01-21 DIAGNOSIS — R609 Edema, unspecified: Secondary | ICD-10-CM | POA: Insufficient documentation

## 2020-01-21 LAB — CMP (CANCER CENTER ONLY)
ALT: 27 U/L (ref 0–44)
AST: 25 U/L (ref 15–41)
Albumin: 3.9 g/dL (ref 3.5–5.0)
Alkaline Phosphatase: 82 U/L (ref 38–126)
Anion gap: 6 (ref 5–15)
BUN: 13 mg/dL (ref 8–23)
CO2: 28 mmol/L (ref 22–32)
Calcium: 10 mg/dL (ref 8.9–10.3)
Chloride: 105 mmol/L (ref 98–111)
Creatinine: 0.91 mg/dL (ref 0.44–1.00)
GFR, Est AFR Am: >60 mL/min (ref >60)
GFR, Estimated: >60 mL/min (ref >60)
Glucose, Bld: 91 mg/dL (ref 70–99)
Potassium: 3.8 mmol/L (ref 3.5–5.1)
Sodium: 139 mmol/L (ref 135–145)
Total Bilirubin: 0.4 mg/dL (ref 0.3–1.2)
Total Protein: 7.4 g/dL (ref 6.5–8.1)

## 2020-01-21 LAB — CBC WITH DIFFERENTIAL (CANCER CENTER ONLY)
Abs Immature Granulocytes: 0.01 10*3/uL (ref 0.00–0.07)
Basophils Absolute: 0 10*3/uL (ref 0.0–0.1)
Basophils Relative: 1 %
Eosinophils Absolute: 0 10*3/uL (ref 0.0–0.5)
Eosinophils Relative: 1 %
HCT: 40.1 % (ref 36.0–46.0)
Hemoglobin: 12.5 g/dL (ref 12.0–15.0)
Immature Granulocytes: 0 %
Lymphocytes Relative: 31 %
Lymphs Abs: 1.4 10*3/uL (ref 0.7–4.0)
MCH: 23.1 pg — ABNORMAL LOW (ref 26.0–34.0)
MCHC: 31.2 g/dL (ref 30.0–36.0)
MCV: 74.3 fL — ABNORMAL LOW (ref 80.0–100.0)
Monocytes Absolute: 0.4 10*3/uL (ref 0.1–1.0)
Monocytes Relative: 8 %
Neutro Abs: 2.6 10*3/uL (ref 1.7–7.7)
Neutrophils Relative %: 59 %
Platelet Count: 191 10*3/uL (ref 150–400)
RBC: 5.4 MIL/uL — ABNORMAL HIGH (ref 3.87–5.11)
RDW: 16.5 % — ABNORMAL HIGH (ref 11.5–15.5)
WBC Count: 4.4 10*3/uL (ref 4.0–10.5)
nRBC: 0 % (ref 0.0–0.2)

## 2020-01-21 LAB — GENETIC SCREENING ORDER

## 2020-01-21 NOTE — Assessment & Plan Note (Signed)
01/02/2019:Routine screening mammogram detected a 1.2cm mass in the right breast at the 12:30 location 7 cm from the nipple, no axillary adenopathy. Biopsy showed IDC, grade 2-3, HER-2 - (1+), ER -, PR -, Ki67 80%.  03/01/2019: Right lumpectomy: Grade 3 IDC, 1.4 cm, margins clear, 0/3 lymph nodes negative, triple negative stage IIb  Treatment plan: 1.Adjuvant chemotherapy with dose dense Adriamycin and Cytoxan x4 followed by Taxol weekly x9 (completed February 2021) 2.Followed by radiation therapycompleted 09/11/2019  Patient enrolled in SWOG 1714 clinical trial ----------------------------------------------------------------------------------------------------------------------------------------------------- Surveillance:  Breast exams 10/15/2019: Benign Mammograms: Have been ordered to be done this month.  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 I encouraged her to exercise and walk regularly. The neuropathy symptoms are getting better with time.  Lower extremity edema: No clear etiology.  Currently on Eliquis because of the blood clot at the tip of the port. 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  Could be thrombus or tumor. Patient is being followed at [facility] cardiology for this.  Return to clinic in 1 year for follow-up

## 2020-01-21 NOTE — Progress Notes (Signed)
REFERRING PROVIDER: Nicholas Lose, MD [place],  [place] [id number]  PRIMARY PROVIDER:  Aretta Nip, MD  PRIMARY REASON FOR VISIT:  1. Malignant neoplasm of upper-inner quadrant of right breast in female, estrogen receptor negative ([place])   2. Family history of prostate cancer   3. Family history of breast cancer      HISTORY OF PRESENT ILLNESS:   Kathleen Potter, a 64 y.o. female, was seen for a [place] cancer genetics consultation at the request of Dr. Lindi Adie due to a personal and family history of breast cancer.  Kathleen Potter presents to clinic today to discuss the possibility of a hereditary predisposition to cancer, genetic testing, and to further clarify her future cancer risks, as well as potential cancer risks for family members.   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    CANCER HISTORY:  Oncology History  Malignant neoplasm of upper-inner quadrant of right breast in female, estrogen receptor negative ([place])  01/02/2019 Initial Diagnosis   Routine screening mammogram detected a 1.2cm mass in the right breast at the 12:30 location 7 cm from the nipple, no axillary adenopathy. Biopsy showed IDC, grade 2-3, HER-2 - (1+), ER -, PR -, Ki67 80%.    01/03/2019 Cancer Staging   Staging form: Breast, AJCC 8th Edition - Clinical stage from 01/03/2019: Stage IB (cT1c, cN0, cM0, G3, ER-, PR-, HER2-)    03/01/2019 Surgery   Right breast lumpectomy ([facility]) ([id number]): IDC, grade 3, 1.4cm, clear margins, 3 lymph nodes negative for carcinoma.    03/09/2019 Cancer Staging   Staging form: Breast, AJCC 8th Edition - Pathologic stage from 03/09/2019: Stage IB (pT1c, pN0, cM0, G3, ER-, PR-, HER2-)    03/27/2019 - 07/17/2019 Chemotherapy   DOXOrubicin (ADRIAMYCIN) chemo injection 118 mg, 50 mg/m2 = 118 mg (83.3 % of original dose 60 mg/m2), Intravenous,  Once, 4 of 4 cycles. Dose  modification: 50 mg/m2 (original dose 60 mg/m2, Cycle 1, Reason: Provider Judgment), 45 mg/m2 (original dose 60 mg/m2, Cycle 3, Reason: Provider Judgment) Administration: 118 mg (03/27/2019), 118 mg (04/10/2019), 106 mg (04/24/2019), 106 mg (05/08/2019)  palonosetron (ALOXI) injection 0.25 mg, 0.25 mg, Intravenous,  Once, 12 of 15 cycles Administration: 0.25 mg (03/27/2019), 0.25 mg (04/10/2019), 0.25 mg (04/24/2019), 0.25 mg (05/08/2019), 0.25 mg (05/29/2019), 0.25 mg (06/06/2019), 0.25 mg (06/12/2019), 0.25 mg (06/19/2019), 0.25 mg (06/26/2019), 0.25 mg (07/03/2019), 0.25 mg (07/10/2019), 0.25 mg (07/17/2019)  pegfilgrastim-jmdb (FULPHILA) injection 6 mg, 6 mg, Subcutaneous,  Once, 4 of 4 cycles. Administration: 6 mg (03/29/2019), 6 mg (04/12/2019), 6 mg (04/26/2019), 6 mg (05/10/2019)  cyclophosphamide (CYTOXAN) 1,180 mg in sodium chloride 0.9 % 250 mL chemo infusion, 500 mg/m2 = 1,180 mg (83.3 % of original dose 600 mg/m2), Intravenous,  Once, 4 of 4 cycles Dose modification: 500 mg/m2 (original dose 600 mg/m2, Cycle 1, Reason: Provider Judgment), 450 mg/m2 (original dose 600 mg/m2, Cycle 3, Reason: Provider Judgment) Administration: 1,180 mg (03/27/2019), 1,180 mg (04/10/2019), 1,060 mg (04/24/2019), 1,060 mg (05/08/2019)  PACLitaxel (TAXOL) 192 mg in sodium chloride 0.9 % 250 mL chemo infusion (</= 32m/m2), 80 mg/m2 = 192 mg, Intravenous,  Once, 9 of 12 cycles Dose modification: 65 mg/m2 (original dose 80 mg/m2, Cycle 12, Reason: Dose not tolerated) Administration: 192 mg (05/22/2019), 192 mg (05/29/2019), 192 mg (06/06/2019), 192 mg (06/12/2019), 192 mg (06/19/2019), 192 mg (06/26/2019), 192 mg (07/03/2019), 156 mg (07/10/2019), 156 mg (07/17/2019)  fosaprepitant (EMEND)  150 mg, dexamethasone (DECADRON) 12 mg in sodium chloride 0.9 % 145 mL IVPB, , Intravenous,  Once, 4 of 4 cycles Administration:  (03/27/2019),  (04/10/2019),  (04/24/2019),  (05/08/2019)   08/15/2019 - 09/11/2019 Radiation Therapy   The patient initially  received a dose of 42.56 Gy in 16 fractions to the breast using whole-breast tangent fields. This was delivered using a 3-D conformal technique. The patient then received a boost to the seroma. This delivered an additional 10 Gy in 42factions using an en face electron field due to the depth of the seroma. The total dose was 52.56Gy.      RISK FACTORS:  Menarche was at age [age]  First live birth at age 89  OCP use for approximately 8 years.  Ovaries intact: yes.  Hysterectomy: yes.  Menopausal status: postmenopausal.  HRT use: 0 years. Colonoscopy: yes; normal. Mammogram within the last year: yes. Number of breast biopsies: 0. Up to date with pelvic exams: yes. Any excessive radiation exposure in the past: no  Past Medical History:  Diagnosis Date  . Family history of breast cancer   . Family history of prostate cancer   . Hypertension    Pt states her BP has been running high lately.    Past Surgical History:  Procedure Laterality Date  . BREAST BIOPSY Right   . BREAST LUMPECTOMY WITH RADIOACTIVE SEED AND SENTINEL LYMPH NODE BIOPSY Right 03/01/2019   Procedure: RIGHT BREAST LUMPECTOMY WITH RADIOACTIVE SEED;  Surgeon: CErroll Luna MD;  [facility]  Service: General;  Laterality: Right;  . PARTIAL HYSTERECTOMY    . PORTACATH PLACEMENT N/A 03/01/2019   Procedure: INSERTION PORT-A-CATH WITH ULTRASOUND;  Surgeon: CErroll Luna MD;  [facility]  Service: General;  Laterality: N/A;  . SENTINEL NODE BIOPSY Right 03/01/2019   Procedure: Right Sentinel Lymph Node Biopsy;  Surgeon: CErroll Luna MD;  [facility]  Service: General;  Laterality: Right;  . TEE WITHOUT CARDIOVERSION N/A 10/16/2019   Procedure: TRANSESOPHAGEAL ECHOCARDIOGRAM (TEE);  Surgeon: HPixie Casino MD;  [facility];  Service: Cardiovascular;  Laterality: N/A;    Social History   Socioeconomic History  . Marital status: Single    Spouse name: Not on file  . Number of children:  Not on file  . Years of education: Not on file  . Highest education level: Not on file  Occupational History  . Not on file  Tobacco Use  . Smoking status: Never Smoker  . Smokeless tobacco: Never Used  Vaping Use  . Vaping Use: Never used  Substance and Sexual Activity  . Alcohol use: Not Currently  . Drug use: Never  . Sexual activity: Not on file  Other Topics Concern  . Not on file  Social History Narrative  . Not on file   Social Determinants of Health   Financial Resource Strain:   . Difficulty of Paying Living Expenses: Not on file  Food Insecurity:   . Worried About RCharity fundraiserin the Last Year: Not on file  . Ran Out of Food in the Last Year: Not on file  Transportation Needs:   . Lack of Transportation (Medical): Not on file  . Lack of Transportation (Non-Medical): Not on file  Physical Activity:   . Days of Exercise per Week: Not on file  . Minutes of Exercise per Session: Not on file  Stress:   . Feeling of Stress : Not on file  Social Connections:   . Frequency of  Communication with Friends and Family: Not on file  . Frequency of Social Gatherings with Friends and Family: Not on file  . Attends Religious Services: Not on file  . Active Member of Clubs or Organizations: Not on file  . Attends Archivist Meetings: Not on file  . Marital Status: Not on file     FAMILY HISTORY:  We obtained a detailed, 4-generation family history.  Significant diagnoses are listed below: Family History  Problem Relation Age of Onset  . Breast cancer Sister 87  . Breast cancer Maternal Aunt        dx >50  . Prostate cancer Paternal Uncle        dx. early-mid 33s, not metastatic    The patient has one son who is cancer free.  She has three sisters, one who had breast cancer at 23.  Both parents are deceased.  The patient's mother died at 77 from sarcoidosis.  She had a twin brother, and another brother and sister.  The sister had breast cancer over age  59.  The maternal grandparents are deceased.  The grandmother may have had a heart condition.  The patient's father died at 18 from 10old age'.  He had seven brothers, one who had prostate cancer (and was married to the patient's maternal aunt who had breast cancer).  She thinks that some of her paternal cousins may have had cancer.  The paternal grandparents are deceased from heart disease.  Kathleen Potter is unaware of previous family history of genetic testing for hereditary cancer risks. Patient's maternal ancestors are of African American descent, and paternal ancestors are of African American descent. There is no reported Ashkenazi Jewish ancestry. There is no known consanguinity.  GENETIC COUNSELING ASSESSMENT: Kathleen Potter is a 63 y.o. female with a personal and family history of cancer which is somewhat suggestive of a hereditary cancer syndrome and predisposition to cancer given the number of women in the family with breast cancer and the triple negative status of some of the cancers. We, therefore, discussed and recommended the following at today's visit.   DISCUSSION: We discussed that 5 - 10% of breast cancer is hereditary, with most cases associated with BRCA mutations.  There are other genes that can be associated with hereditary breast cancer syndromes.  These include ATM, CHEK2 and PALB2.  We discussed that testing is beneficial for several reasons including knowing how to follow individuals after completing their treatment, identifying whether potential treatment options such as PARP inhibitors would be beneficial, and understand if other family members could be at risk for cancer and allow them to undergo genetic testing.   We reviewed the characteristics, features and inheritance patterns of hereditary cancer syndromes. We also discussed genetic testing, including the appropriate family members to test, the process of testing, insurance coverage and turn-around-time for results. We  discussed the implications of a negative, positive, carrier and/or variant of uncertain significant result. We recommended Kathleen Potter pursue genetic testing for the common hereditary cancer gene panel. The Common Hereditary Gene Panel offered by [facility] includes sequencing and/or deletion duplication testing of the following 48 genes: APC, ATM, AXIN2, BARD1, BMPR1A, BRCA1, BRCA2, BRIP1, CDH1, CDK4, CDKN2A (p14ARF), CDKN2A (p16INK4a), CHEK2, CTNNA1, DICER1, EPCAM (Deletion/duplication testing only), GREM1 (promoter region deletion/duplication testing only), KIT, MEN1, MLH1, MSH2, MSH3, MSH6, MUTYH, NBN, NF1, NHTL1, PALB2, PDGFRA, PMS2, POLD1, POLE, PTEN, RAD50, RAD51C, RAD51D, RNF43, SDHB, SDHC, SDHD, SMAD4, SMARCA4. STK11, TP53, TSC1, TSC2, and VHL.  The following genes were  evaluated for sequence changes only: SDHA and HOXB13 c.251G>A variant only.   Based on Kathleen Potter's personal and family history of cancer, she meets medical criteria for genetic testing. Despite that she meets criteria, she may still have an out of pocket cost. We discussed that if her out of pocket cost for testing is over $100, the laboratory will call and confirm whether she wants to proceed with testing.  If the out of pocket cost of testing is less than $100 she will be billed by the genetic testing laboratory.   PLAN: After considering the risks, benefits, and limitations, Kathleen Potter provided informed consent to pursue genetic testing and the blood sample was sent to [facility] for analysis of the common hereditary cancer panel. Results should be available within approximately 2-3 weeks' time, at which point they will be disclosed by telephone to Kathleen Potter, as will any additional recommendations warranted by these results. Kathleen Potter will receive a summary of her genetic counseling visit and a copy of her results once available. This information will also be available in Epic.   Lastly, we encouraged Kathleen Potter  to remain in contact with cancer genetics annually so that we can continuously update the family history and inform her of any changes in cancer genetics and testing that may be of benefit for this family.   Kathleen Potter questions were answered to her satisfaction today. Our contact information was provided should additional questions or concerns arise. Thank you for the referral and allowing [place] to share in the care of your patient.   Landen Knoedler P. Florene Glen, [place], [facility] Licensed, Insurance risk surveyor [email or web address] phone: [phone]  The patient was seen for a total of 45 minutes in face-to-face genetic counseling.  This patient was discussed with Drs. Magrinat, Lindi Adie and/or Burr Medico who agrees with the above.    _______________________________________________________________________ For Office Staff:  Number of people involved in session: 1 Was an Intern/ student involved with case: no

## 2020-01-25 ENCOUNTER — Inpatient Hospital Stay: Payer: BC Managed Care – PPO | Attending: Hematology and Oncology

## 2020-01-25 ENCOUNTER — Other Ambulatory Visit: Payer: Self-pay

## 2020-01-25 DIAGNOSIS — Z452 Encounter for adjustment and management of vascular access device: Secondary | ICD-10-CM | POA: Insufficient documentation

## 2020-01-25 DIAGNOSIS — C50211 Malignant neoplasm of upper-inner quadrant of right female breast: Secondary | ICD-10-CM | POA: Insufficient documentation

## 2020-01-25 DIAGNOSIS — Z171 Estrogen receptor negative status [ER-]: Secondary | ICD-10-CM | POA: Insufficient documentation

## 2020-01-25 DIAGNOSIS — Z95828 Presence of other vascular implants and grafts: Secondary | ICD-10-CM

## 2020-01-25 MED ORDER — HEPARIN SOD (PORK) LOCK FLUSH 100 UNIT/ML IV SOLN
500.0000 [IU] | Freq: Once | INTRAVENOUS | Status: AC
  Administered 2020-01-25: 500 [IU]
  Filled 2020-01-25: qty 5

## 2020-01-25 MED ORDER — SODIUM CHLORIDE 0.9% FLUSH
10.0000 mL | Freq: Once | INTRAVENOUS | Status: AC
  Administered 2020-01-25: 10 mL
  Filled 2020-01-25: qty 10

## 2020-01-25 NOTE — Patient Instructions (Signed)

## 2020-02-19 ENCOUNTER — Telehealth: Payer: Self-pay | Admitting: Genetic Counselor

## 2020-02-19 ENCOUNTER — Encounter: Payer: Self-pay | Admitting: Genetic Counselor

## 2020-02-19 DIAGNOSIS — Z1379 Encounter for other screening for genetic and chromosomal anomalies: Secondary | ICD-10-CM | POA: Insufficient documentation

## 2020-02-19 NOTE — Telephone Encounter (Signed)
No answer

## 2020-02-21 ENCOUNTER — Telehealth: Payer: Self-pay | Admitting: Genetic Counselor

## 2020-02-21 ENCOUNTER — Ambulatory Visit: Payer: Self-pay | Admitting: Genetic Counselor

## 2020-02-21 DIAGNOSIS — Z1379 Encounter for other screening for genetic and chromosomal anomalies: Secondary | ICD-10-CM

## 2020-02-21 NOTE — Progress Notes (Signed)
HPI:  Kathleen Potter was previously seen in the [facility] clinic due to a personal and family history of breast cancer and concerns regarding a hereditary predisposition to cancer. Please refer to our prior cancer genetics clinic note for more information regarding our discussion, assessment and recommendations, at the time. Kathleen Potter recent genetic test results were disclosed to her, as were recommendations warranted by these results. These results and recommendations are discussed in more detail below.  CANCER HISTORY:  Oncology History  Malignant neoplasm of upper-inner quadrant of right breast in female, estrogen receptor negative ([place])  01/02/2019 Initial Diagnosis   Routine screening mammogram detected a 1.2cm mass in the right breast at the 12:30 location 7 cm from the nipple, no axillary adenopathy. Biopsy showed IDC, grade 2-3, HER-2 - (1+), ER -, PR -, Ki67 80%.    01/03/2019 Cancer Staging   Staging form: Breast, AJCC 8th Edition - Clinical stage from 01/03/2019: Stage IB (cT1c, cN0, cM0, G3, ER-, PR-, HER2-)    03/01/2019 Surgery   Right breast lumpectomy ([facility]) ([id number]): IDC, grade 3, 1.4cm, clear margins, 3 lymph nodes negative for carcinoma.    03/09/2019 Cancer Staging   Staging form: Breast, AJCC 8th Edition - Pathologic stage from 03/09/2019: Stage IB (pT1c, pN0, cM0, G3, ER-, PR-, HER2-)    03/27/2019 - 07/17/2019 Chemotherapy   DOXOrubicin (ADRIAMYCIN) chemo injection 118 mg, 50 mg/m2 = 118 mg (83.3 % of original dose 60 mg/m2), Intravenous,  Once, 4 of 4 cycles. Dose modification: 50 mg/m2 (original dose 60 mg/m2, Cycle 1, Reason: Provider Judgment), 45 mg/m2 (original dose 60 mg/m2, Cycle 3, Reason: Provider Judgment) Administration: 118 mg (03/27/2019), 118 mg (04/10/2019), 106 mg (04/24/2019), 106 mg (05/08/2019)  palonosetron (ALOXI) injection 0.25 mg, 0.25 mg, Intravenous,  Once, 12 of 15 cycles Administration: 0.25 mg (03/27/2019), 0.25 mg  (04/10/2019), 0.25 mg (04/24/2019), 0.25 mg (05/08/2019), 0.25 mg (05/29/2019), 0.25 mg (06/06/2019), 0.25 mg (06/12/2019), 0.25 mg (06/19/2019), 0.25 mg (06/26/2019), 0.25 mg (07/03/2019), 0.25 mg (07/10/2019), 0.25 mg (07/17/2019)  pegfilgrastim-jmdb (FULPHILA) injection 6 mg, 6 mg, Subcutaneous,  Once, 4 of 4 cycles. Administration: 6 mg (03/29/2019), 6 mg (04/12/2019), 6 mg (04/26/2019), 6 mg (05/10/2019)  cyclophosphamide (CYTOXAN) 1,180 mg in sodium chloride 0.9 % 250 mL chemo infusion, 500 mg/m2 = 1,180 mg (83.3 % of original dose 600 mg/m2), Intravenous,  Once, 4 of 4 cycles Dose modification: 500 mg/m2 (original dose 600 mg/m2, Cycle 1, Reason: Provider Judgment), 450 mg/m2 (original dose 600 mg/m2, Cycle 3, Reason: Provider Judgment) Administration: 1,180 mg (03/27/2019), 1,180 mg (04/10/2019), 1,060 mg (04/24/2019), 1,060 mg (05/08/2019)  PACLitaxel (TAXOL) 192 mg in sodium chloride 0.9 % 250 mL chemo infusion (</= 68m/m2), 80 mg/m2 = 192 mg, Intravenous,  Once, 9 of 12 cycles Dose modification: 65 mg/m2 (original dose 80 mg/m2, Cycle 12, Reason: Dose not tolerated) Administration: 192 mg (05/22/2019), 192 mg (05/29/2019), 192 mg (06/06/2019), 192 mg (06/12/2019), 192 mg (06/19/2019), 192 mg (06/26/2019), 192 mg (07/03/2019), 156 mg (07/10/2019), 156 mg (07/17/2019)  fosaprepitant (EMEND) 150 mg, dexamethasone (DECADRON) 12 mg in sodium chloride 0.9 % 145 mL IVPB, , Intravenous,  Once, 4 of 4 cycles Administration:  (03/27/2019),  (04/10/2019),  (04/24/2019),  (05/08/2019)   08/15/2019 - 09/11/2019 Radiation Therapy   The patient initially received a dose of 42.56 Gy in 16 fractions to the breast using whole-breast tangent fields. This was delivered using a 3-D conformal technique. The patient then received a boost to the seroma. This delivered an additional 10  Gy in 17factions using an en face electron field due to the depth of the seroma. The total dose was 52.56Gy.     FAMILY HISTORY:  We obtained a detailed,  4-generation family history.  Significant diagnoses are listed below: Family History  Problem Relation Age of Onset  . Breast cancer Sister 558 . Breast cancer Maternal Aunt        dx >50  . Prostate cancer Paternal Uncle        dx. early-mid 758s not metastatic    The patient has one son who is cancer free.  She has three sisters, one who had breast cancer at 551  Both parents are deceased.  The patient's mother died at 378from sarcoidosis.  She had a twin brother, and another brother and sister.  The sister had breast cancer over age 65  The maternal grandparents are deceased.  The grandmother may have had a heart condition.  The patient's father died at 857from '59ld age'.  He had seven brothers, one who had prostate cancer (and was married to the patient's maternal aunt who had breast cancer).  She thinks that some of her paternal cousins may have had cancer.  The paternal grandparents are deceased from heart disease.  Kathleen Potter unaware of previous family history of genetic testing for hereditary cancer risks. Patient's maternal ancestors are of African American descent, and paternal ancestors are of African American descent. There is no reported Ashkenazi Jewish ancestry. There is no known consanguinity.   GENETIC TEST RESULTS: Genetic testing reported out on February 18, 2020 through the common hereditary cancer panel found no pathogenic mutations. The Common Hereditary Gene Panel offered by [facility] includes sequencing and/or deletion duplication testing of the following 48 genes: APC, ATM, AXIN2, BARD1, BMPR1A, BRCA1, BRCA2, BRIP1, CDH1, CDK4, CDKN2A (p14ARF), CDKN2A (p16INK4a), CHEK2, CTNNA1, DICER1, EPCAM (Deletion/duplication testing only), GREM1 (promoter region deletion/duplication testing only), KIT, MEN1, MLH1, MSH2, MSH3, MSH6, MUTYH, NBN, NF1, NHTL1, PALB2, PDGFRA, PMS2, POLD1, POLE, PTEN, RAD50, RAD51C, RAD51D, RNF43, SDHB, SDHC, SDHD, SMAD4, SMARCA4. STK11, TP53, TSC1,  TSC2, and VHL.  The following genes were evaluated for sequence changes only: SDHA and HOXB13 c.251G>A variant only. The test report has been scanned into EPIC and is located under the Molecular Pathology section of the Results Review tab.  A portion of the result report is included below for reference.     We discussed with Kathleen Potter because current genetic testing is not perfect, it is possible there may be a gene mutation in one of these genes that current testing cannot detect, but that chance is small.  We also discussed, that there could be another gene that has not yet been discovered, or that we have not yet tested, that is responsible for the cancer diagnoses in the family. It is also possible there is a hereditary cause for the cancer in the family that Kathleen Potter not inherit and therefore was not identified in her testing.  Therefore, it is important to remain in touch with cancer genetics in the future so that we can continue to offer Ms. SLangworthythe most up to date genetic testing.   ADDITIONAL GENETIC TESTING: We discussed with Kathleen Potter there are other genes that are associated with increased cancer risk that can be analyzed. Should Kathleen Potter to pursue additional genetic testing, we are happy to discuss and coordinate this testing, at any time.    CANCER SCREENING RECOMMENDATIONS: Ms. SStandishtest  result is considered negative (normal).  This means that we have not identified a hereditary cause for her personal and family history of breast cancer at this time. Most cancers happen by chance and this negative test suggests that her cancer may fall into this category.    While reassuring, this does not definitively rule out a hereditary predisposition to cancer. It is still possible that there could be genetic mutations that are undetectable by current technology. There could be genetic mutations in genes that have not been tested or identified to increase  cancer risk.  Therefore, it is recommended she continue to follow the cancer management and screening guidelines provided by her oncology and primary healthcare provider.   An individual's cancer risk and medical management are not determined by genetic test results alone. Overall cancer risk assessment incorporates additional factors, including personal medical history, family history, and any available genetic information that may result in a personalized plan for cancer prevention and surveillance.  RECOMMENDATIONS FOR FAMILY MEMBERS:  Individuals in this family might be at some increased risk of developing cancer, over the general population risk, simply due to the family history of cancer.  We recommended women in this family have a yearly mammogram beginning at age 3, or 44 years younger than the earliest onset of cancer, an annual clinical breast exam, and perform monthly breast self-exams. Women in this family should also have a gynecological exam as recommended by their primary provider. All family members should be referred for colonoscopy starting at age 94.  FOLLOW-UP: Lastly, we discussed with Kathleen Potter that cancer genetics is a rapidly advancing field and it is possible that new genetic tests will be appropriate for her and/or her family members in the future. We encouraged her to remain in contact with cancer genetics on an annual basis so we can update her personal and family histories and let her know of advances in cancer genetics that may benefit this family.   Our contact number was provided. Kathleen Potter questions were answered to her satisfaction, and she knows she is welcome to call us at anytime with additional questions or concerns.   Roma Kayser, [place], [facility] Licensed, Certified Genetic Counselor [email or web address]

## 2020-02-21 NOTE — Telephone Encounter (Signed)
Revealed negative genetic testing.  Discussed that we do not know why she has breast cancer or why there is cancer in the family. It could be due to a different gene that we are not testing, or maybe our current technology may not be able to pick something up.  It will be important for her to keep in contact with genetics to keep up with whether additional testing may be needed. 

## 2020-03-26 ENCOUNTER — Inpatient Hospital Stay: Payer: BC Managed Care – PPO | Attending: Hematology and Oncology

## 2020-03-26 ENCOUNTER — Other Ambulatory Visit: Payer: Self-pay

## 2020-03-26 VITALS — BP 124/83 | HR 91 | Temp 98.2°F | Resp 18

## 2020-03-26 DIAGNOSIS — Z452 Encounter for adjustment and management of vascular access device: Secondary | ICD-10-CM | POA: Insufficient documentation

## 2020-03-26 DIAGNOSIS — Z95828 Presence of other vascular implants and grafts: Secondary | ICD-10-CM

## 2020-03-26 DIAGNOSIS — C50211 Malignant neoplasm of upper-inner quadrant of right female breast: Secondary | ICD-10-CM

## 2020-03-26 DIAGNOSIS — C50212 Malignant neoplasm of upper-inner quadrant of left female breast: Secondary | ICD-10-CM | POA: Diagnosis not present

## 2020-03-26 DIAGNOSIS — Z171 Estrogen receptor negative status [ER-]: Secondary | ICD-10-CM | POA: Diagnosis not present

## 2020-03-26 MED ORDER — HEPARIN SOD (PORK) LOCK FLUSH 100 UNIT/ML IV SOLN
500.0000 [IU] | Freq: Once | INTRAVENOUS | Status: AC
  Administered 2020-03-26: 500 [IU]
  Filled 2020-03-26: qty 5

## 2020-03-26 MED ORDER — SODIUM CHLORIDE 0.9% FLUSH
10.0000 mL | Freq: Once | INTRAVENOUS | Status: AC
  Administered 2020-03-26: 10 mL
  Filled 2020-03-26: qty 10

## 2020-03-26 NOTE — Patient Instructions (Signed)

## 2020-04-03 ENCOUNTER — Other Ambulatory Visit: Payer: Self-pay | Admitting: Hematology and Oncology

## 2020-04-05 ENCOUNTER — Other Ambulatory Visit: Payer: Self-pay | Admitting: Hematology and Oncology

## 2020-04-27 NOTE — Progress Notes (Signed)
Patient Care Team: Rankins, Bill Salinas, MD as PCP - General (Family Medicine) Erroll Luna, MD as Consulting Physician (General Surgery) Nicholas Lose, MD as Consulting Physician (Hematology and Oncology) Kyung Rudd, MD as Consulting Physician (Radiation Oncology)  DIAGNOSIS:    ICD-10-CM   1. Malignant neoplasm of upper-inner quadrant of right breast in female, estrogen receptor negative ([place])  C50.211    Z17.1     SUMMARY OF ONCOLOGIC HISTORY: Oncology History  Malignant neoplasm of upper-inner quadrant of right breast in female, estrogen receptor negative ([place])  01/02/2019 Initial Diagnosis   Routine screening mammogram detected a 1.2cm mass in the right breast at the 12:30 location 7 cm from the nipple, no axillary adenopathy. Biopsy showed IDC, grade 2-3, HER-2 - (1+), ER -, PR -, Ki67 80%.    01/03/2019 Cancer Staging   Staging form: Breast, AJCC 8th Edition - Clinical stage from 01/03/2019: Stage IB (cT1c, cN0, cM0, G3, ER-, PR-, HER2-)    03/01/2019 Surgery   Right breast lumpectomy ([place]) ([id number]): IDC, grade 3, 1.4cm, clear margins, 3 lymph nodes negative for carcinoma.    03/09/2019 Cancer Staging   Staging form: Breast, AJCC 8th Edition - Pathologic stage from 03/09/2019: Stage IB (pT1c, pN0, cM0, G3, ER-, PR-, HER2-)    03/27/2019 - 07/17/2019 Chemotherapy   DOXOrubicin (ADRIAMYCIN) chemo injection 118 mg, 50 mg/m2 = 118 mg (83.3 % of original dose 60 mg/m2), Intravenous,  Once, 4 of 4 cycles. Dose modification: 50 mg/m2 (original dose 60 mg/m2, Cycle 1, Reason: Provider Judgment), 45 mg/m2 (original dose 60 mg/m2, Cycle 3, Reason: Provider Judgment) Administration: 118 mg (03/27/2019), 118 mg (04/10/2019), 106 mg (04/24/2019), 106 mg (05/08/2019)  palonosetron (ALOXI) injection 0.25 mg, 0.25 mg, Intravenous,  Once, 12 of 15 cycles Administration: 0.25 mg (03/27/2019), 0.25 mg (04/10/2019), 0.25 mg (04/24/2019), 0.25 mg (05/08/2019), 0.25 mg (05/29/2019), 0.25 mg  (06/06/2019), 0.25 mg (06/12/2019), 0.25 mg (06/19/2019), 0.25 mg (06/26/2019), 0.25 mg (07/03/2019), 0.25 mg (07/10/2019), 0.25 mg (07/17/2019)  pegfilgrastim-jmdb (FULPHILA) injection 6 mg, 6 mg, Subcutaneous,  Once, 4 of 4 cycles. Administration: 6 mg (03/29/2019), 6 mg (04/12/2019), 6 mg (04/26/2019), 6 mg (05/10/2019)  cyclophosphamide (CYTOXAN) 1,180 mg in sodium chloride 0.9 % 250 mL chemo infusion, 500 mg/m2 = 1,180 mg (83.3 % of original dose 600 mg/m2), Intravenous,  Once, 4 of 4 cycles Dose modification: 500 mg/m2 (original dose 600 mg/m2, Cycle 1, Reason: Provider Judgment), 450 mg/m2 (original dose 600 mg/m2, Cycle 3, Reason: Provider Judgment) Administration: 1,180 mg (03/27/2019), 1,180 mg (04/10/2019), 1,060 mg (04/24/2019), 1,060 mg (05/08/2019)  PACLitaxel (TAXOL) 192 mg in sodium chloride 0.9 % 250 mL chemo infusion (</= 76m/m2), 80 mg/m2 = 192 mg, Intravenous,  Once, 9 of 12 cycles Dose modification: 65 mg/m2 (original dose 80 mg/m2, Cycle 12, Reason: Dose not tolerated) Administration: 192 mg (05/22/2019), 192 mg (05/29/2019), 192 mg (06/06/2019), 192 mg (06/12/2019), 192 mg (06/19/2019), 192 mg (06/26/2019), 192 mg (07/03/2019), 156 mg (07/10/2019), 156 mg (07/17/2019)  fosaprepitant (EMEND) 150 mg, dexamethasone (DECADRON) 12 mg in sodium chloride 0.9 % 145 mL IVPB, , Intravenous,  Once, 4 of 4 cycles Administration:  (03/27/2019),  (04/10/2019),  (04/24/2019),  (05/08/2019)   08/15/2019 - 09/11/2019 Radiation Therapy   The patient initially received a dose of 42.56 Gy in 16 fractions to the breast using whole-breast tangent fields. This was delivered using a 3-D conformal technique. The patient then received a boost to the seroma. This delivered an additional 10 Gy in 349fctions using an en face electron  field due to the depth of the seroma. The total dose was 52.56Gy.     CHIEF COMPLIANT: Surveillance of triple negative right breast cancer  INTERVAL HISTORY: Kathleen Potter is a 64 y.o. with  above-mentioned history of triple negative right breast cancer who underwent a lumpectomy,adjuvant chemotherapy,radiation,and is currently onsurviellance. She presents to the clinic today for follow-up.  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  She thinks that her energy levels are coming back up.  She is willing to follow-up with Dr. Nanetta Batty with cardiology regarding the atrial thrombus.  Port is still in place and she will asked Dr. Nanetta Batty next month when she meets them when she can get the port out.  She is currently on Eliquis but her insurance is changing and that she may need to change to Xarelto.  Denies any problems taking Xarelto.  Denies any lumps or nodules in the breast.  ALLERGIES:  is allergic to latex.  MEDICATIONS:  Current Outpatient Medications  Medication Sig Dispense Refill  . Cholecalciferol (VITAMIN D) 50 MCG (2000 UT) tablet Take 2,000 Units by mouth daily.    . Cyanocobalamin (B-12-SL) 1000 MCG SUBL Place 1,000 mcg under the tongue daily.    Marland Kitchen ELIQUIS 5 MG TABS tablet TAKE 1 TABLET BY MOUTH TWICE A DAY 60 tablet 5  . furosemide (LASIX) 20 MG tablet TAKE 1 TABLET BY MOUTH EVERY DAY 30 tablet 1  . lidocaine-prilocaine (EMLA) cream Apply to affected area once 30 g 3  . potassium chloride (KLOR-CON) 10 MEQ tablet TAKE 1 TABLET BY MOUTH EVERY DAY 30 tablet 0  . pyridOXINE (VITAMIN B-6) 100 MG tablet Take 100 mg by mouth 3 (three) times a week.    . TURMERIC CURCUMIN PO Take 1 tablet by mouth 3 (three) times a week.     No current facility-administered medications for this visit.    PHYSICAL EXAMINATION: ECOG PERFORMANCE STATUS: 1 - Symptomatic but completely ambulatory  Vitals:   04/28/20 1341  BP: 128/68  Pulse: 68  Resp: 17  Temp: (!) 97.1 F (36.2 C)  SpO2: 99%   Filed Weights   04/28/20 1341  Weight: 258 lb 1.6 oz (117.1 kg)    BREAST: No palpable masses or nodules in either right or left breasts. No palpable axillary  supraclavicular or infraclavicular adenopathy no breast tenderness or nipple discharge. (exam performed in the presence of a chaperone)  LABORATORY DATA:  I have reviewed the data as listed CMP Latest Ref Rng & Units 01/21/2020 12/09/2019 10/15/2019  Glucose 70 - 99 mg/dL 91 92 95  BUN 8 - 23 mg/dL _0 Creatinine 0.44 - 1.00 mg/dL 0.91 0.84 0.86  Sodium 135 - 145 mmol/L 139 138 145  Potassium 3.5 - 5.1 mmol/L 3.8 3.8 3.8  Chloride 98 - 111 mmol/L 105 106 111  CO2 22 - 32 mmol/L _1 Calcium 8.9 - 10.3 mg/dL 10.0 9.0 8.8(L)  Total Protein 6.5 - 8.1 g/dL 7.4 - -  Total Bilirubin 0.3 - 1.2 mg/dL 0.4 - -  Alkaline Phos 38 - 126 U/L 82 - -  AST 15 - 41 U/L 25 - -  ALT 0 - 44 U/L 27 - -    Lab Results  Component Value Date   WBC 4.5 04/28/2020   HGB 11.9 (L) 04/28/2020   HCT 37.6 04/28/2020   MCV 73.7 (L) 04/28/2020   PLT 169 04/28/2020   NEUTROABS 2.6 04/28/2020  ASSESSMENT & PLAN:  Malignant neoplasm of upper-inner quadrant of right breast in female, estrogen receptor negative ([place]) 01/02/2019:Routine screening mammogram detected a 1.2cm mass in the right breast at the 12:30 location 7 cm from the nipple, no axillary adenopathy. Biopsy showed IDC, grade 2-3, HER-2 - (1+), ER -, PR -, Ki67 80%.  03/01/2019: Right lumpectomy: Grade 3 IDC, 1.4 cm, margins clear, 0/3 lymph nodes negative, triple negative stage IIb  Treatment plan: 1.Adjuvant chemotherapy with dose dense Adriamycin and Cytoxan x4 followed by Taxol weekly x9 (completed February 2021) 2.Followed by radiation therapycompleted 09/11/2019  Patient enrolled in SWOG 1714 clinical trial ----------------------------------------------------------------------------------------------------------------------------------------------------- Surveillance:  Breast exams12/10/2019: Benign Mammograms: Have been ordered but patient does not want to do the mammograms until the port is removed.  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 I encouraged her to exercise and walk regularly. The neuropathy symptoms are getting better with time.  Lower extremity edema: No clear etiology.  Currently on Eliquis because of the blood clot at the tip of the port. 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  Could be thrombus or tumor. TEE August 2021 at [facility]: Presence of the right atrial mass is still detected but the did not measure it. Patient is being followed by cardiology for this.  She will check with cardiology if she can switch to Xarelto.  Her insurance is changing and that is what they recommend.  I will arrange for her to get the port flushes until she gets her port removed. Return to clinic in 1 year for follow-up     No orders of the defined types were placed in this encounter.  The patient has a good understanding of the overall plan. she agrees with it. she will call with any problems that may develop before the next visit here.  Total time spent: 20 mins including face to face time and time spent for planning, charting and coordination of care  Nicholas Lose, MD 04/28/2020  I, Cloyde Reams Dorshimer, am acting as scribe for Dr. Nicholas Lose.  I have reviewed the above documentation for accuracy and completeness, and I agree with the above.

## 2020-04-28 ENCOUNTER — Inpatient Hospital Stay: Payer: BC Managed Care – PPO

## 2020-04-28 ENCOUNTER — Other Ambulatory Visit: Payer: Self-pay

## 2020-04-28 ENCOUNTER — Other Ambulatory Visit: Payer: Self-pay | Admitting: *Deleted

## 2020-04-28 ENCOUNTER — Inpatient Hospital Stay: Payer: BC Managed Care – PPO | Attending: Hematology and Oncology | Admitting: Hematology and Oncology

## 2020-04-28 DIAGNOSIS — Z171 Estrogen receptor negative status [ER-]: Secondary | ICD-10-CM

## 2020-04-28 DIAGNOSIS — Z923 Personal history of irradiation: Secondary | ICD-10-CM | POA: Insufficient documentation

## 2020-04-28 DIAGNOSIS — Z9221 Personal history of antineoplastic chemotherapy: Secondary | ICD-10-CM | POA: Insufficient documentation

## 2020-04-28 DIAGNOSIS — G620 Drug-induced polyneuropathy: Secondary | ICD-10-CM | POA: Diagnosis not present

## 2020-04-28 DIAGNOSIS — C50211 Malignant neoplasm of upper-inner quadrant of right female breast: Secondary | ICD-10-CM | POA: Diagnosis present

## 2020-04-28 DIAGNOSIS — I749 Embolism and thrombosis of unspecified artery: Secondary | ICD-10-CM | POA: Insufficient documentation

## 2020-04-28 DIAGNOSIS — Z7901 Long term (current) use of anticoagulants: Secondary | ICD-10-CM | POA: Insufficient documentation

## 2020-04-28 LAB — CBC WITH DIFFERENTIAL (CANCER CENTER ONLY)
Abs Immature Granulocytes: 0.01 10*3/uL (ref 0.00–0.07)
Basophils Absolute: 0 10*3/uL (ref 0.0–0.1)
Basophils Relative: 0 %
Eosinophils Absolute: 0 10*3/uL (ref 0.0–0.5)
Eosinophils Relative: 1 %
HCT: 37.6 % (ref 36.0–46.0)
Hemoglobin: 11.9 g/dL — ABNORMAL LOW (ref 12.0–15.0)
Immature Granulocytes: 0 %
Lymphocytes Relative: 35 %
Lymphs Abs: 1.6 10*3/uL (ref 0.7–4.0)
MCH: 23.3 pg — ABNORMAL LOW (ref 26.0–34.0)
MCHC: 31.6 g/dL (ref 30.0–36.0)
MCV: 73.7 fL — ABNORMAL LOW (ref 80.0–100.0)
Monocytes Absolute: 0.3 10*3/uL (ref 0.1–1.0)
Monocytes Relative: 7 %
Neutro Abs: 2.6 10*3/uL (ref 1.7–7.7)
Neutrophils Relative %: 57 %
Platelet Count: 169 10*3/uL (ref 150–400)
RBC: 5.1 MIL/uL (ref 3.87–5.11)
RDW: 16.4 % — ABNORMAL HIGH (ref 11.5–15.5)
WBC Count: 4.5 10*3/uL (ref 4.0–10.5)
nRBC: 0 % (ref 0.0–0.2)

## 2020-04-28 LAB — CMP (CANCER CENTER ONLY)
ALT: 25 U/L (ref 0–44)
AST: 23 U/L (ref 15–41)
Albumin: 3.6 g/dL (ref 3.5–5.0)
Alkaline Phosphatase: 75 U/L (ref 38–126)
Anion gap: 6 (ref 5–15)
BUN: 14 mg/dL (ref 8–23)
CO2: 26 mmol/L (ref 22–32)
Calcium: 9.1 mg/dL (ref 8.9–10.3)
Chloride: 109 mmol/L (ref 98–111)
Creatinine: 0.92 mg/dL (ref 0.44–1.00)
GFR, Estimated: >60 mL/min (ref >60)
Glucose, Bld: 85 mg/dL (ref 70–99)
Potassium: 3.8 mmol/L (ref 3.5–5.1)
Sodium: 141 mmol/L (ref 135–145)
Total Bilirubin: 0.4 mg/dL (ref 0.3–1.2)
Total Protein: 6.9 g/dL (ref 6.5–8.1)

## 2020-04-28 NOTE — Assessment & Plan Note (Signed)
01/02/2019:Routine screening mammogram detected a 1.2cm mass in the right breast at the 12:30 location 7 cm from the nipple, no axillary adenopathy. Biopsy showed IDC, grade 2-3, HER-2 - (1+), ER -, PR -, Ki67 80%.  03/01/2019: Right lumpectomy: Grade 3 IDC, 1.4 cm, margins clear, 0/3 lymph nodes negative, triple negative stage IIb  Treatment plan: 1.Adjuvant chemotherapy with dose dense Adriamycin and Cytoxan x4 followed by Taxol weekly x9 (completed February 2021) 2.Followed by radiation therapycompleted 09/11/2019  Patient enrolled in SWOG 1714 clinical trial ----------------------------------------------------------------------------------------------------------------------------------------------------- Surveillance:  Breast exams5/24/2021: Benign Mammograms: Have been ordered but patient does not want to do the mammograms until the port is removed.  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 I encouraged her to exercise and walk regularly. The neuropathy symptoms are getting better with time.  Lower extremity edema: No clear etiology.  Currently on Eliquis because of the blood clot at the tip of the port. 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  Could be thrombus or tumor. TEE August 2021 at [facility]: Presence of the right atrial mass is still detected but the did not measure it. Patient is being followed at [facility] cardiology for this. I discussed with her that we can remove the port since it is not connected to the mass. However she wants to wait for the cardiologist to give her the clearance.  She will continue 3 more months of anticoagulation.  I will arrange for her to get the port flushes. Return to clinic in December for follow-up

## 2020-04-29 ENCOUNTER — Other Ambulatory Visit: Payer: Self-pay | Admitting: Hematology and Oncology

## 2020-05-25 ENCOUNTER — Other Ambulatory Visit: Payer: Self-pay | Admitting: Hematology and Oncology

## 2020-05-27 ENCOUNTER — Telehealth: Payer: Self-pay | Admitting: Pharmacist Clinician (PhC)/ Clinical Pharmacy Specialist

## 2020-05-27 NOTE — Telephone Encounter (Signed)
Patient insurance requires switch from Eliquis to Xarelto in 2022.  States she has about 2 months more of Eliquis.  Asked her to call office once she is down to 2 weeks of Eliquis and we can make the conversion

## 2020-06-03 ENCOUNTER — Telehealth: Payer: Self-pay | Admitting: Pharmacist Clinician (PhC)/ Clinical Pharmacy Specialist

## 2020-06-03 MED ORDER — RIVAROXABAN 20 MG PO TABS: 20.0000 mg | ORAL_TABLET | Freq: Every day | ORAL | 1 refills | Status: DC

## 2020-06-03 NOTE — Telephone Encounter (Signed)
Spoke with patient - she actually only has about 1-2 weeks of Eliquis at home.  Explained that transition is that she should take first dose of Xarelto 12 hours after last dose of Eliquis.  She should plan to take her last Eliquis dose in a morning so that she can start Xarelto 20 mg each night with supper.  Stressed the need to take with food and patient verbalized understanding.    Will [facility] [place].    CrCl is 59.3 with IBW.

## 2020-06-24 ENCOUNTER — Other Ambulatory Visit: Payer: Self-pay | Admitting: Hematology and Oncology

## 2020-07-01 ENCOUNTER — Inpatient Hospital Stay: Payer: BC Managed Care – PPO | Attending: Hematology and Oncology

## 2020-07-01 ENCOUNTER — Other Ambulatory Visit: Payer: Self-pay

## 2020-07-01 DIAGNOSIS — C50211 Malignant neoplasm of upper-inner quadrant of right female breast: Secondary | ICD-10-CM | POA: Diagnosis not present

## 2020-07-01 DIAGNOSIS — Z171 Estrogen receptor negative status [ER-]: Secondary | ICD-10-CM | POA: Insufficient documentation

## 2020-07-01 DIAGNOSIS — Z452 Encounter for adjustment and management of vascular access device: Secondary | ICD-10-CM | POA: Insufficient documentation

## 2020-07-01 DIAGNOSIS — Z95828 Presence of other vascular implants and grafts: Secondary | ICD-10-CM

## 2020-07-01 MED ORDER — HEPARIN SOD (PORK) LOCK FLUSH 100 UNIT/ML IV SOLN
500.0000 [IU] | Freq: Once | INTRAVENOUS | Status: AC
  Administered 2020-07-01: 500 [IU]
  Filled 2020-07-01: qty 5

## 2020-07-01 MED ORDER — SODIUM CHLORIDE 0.9% FLUSH
10.0000 mL | Freq: Once | INTRAVENOUS | Status: AC
  Administered 2020-07-01: 10 mL
  Filled 2020-07-01: qty 10

## 2020-07-01 NOTE — Patient Instructions (Signed)
Implanted Port Insertion, Care After This sheet gives you information about how to care for yourself after your procedure. Your health care provider may also give you more specific instructions. If you have problems or questions, contact your health care provider. What can I expect after the procedure? After the procedure, it is common to have:  Discomfort at the port insertion site.  Bruising on the skin over the port. This should improve over 3-4 days. Follow these instructions at home: Port care  After your port is placed, you will get a manufacturer's information card. The card has information about your port. Keep this card with you at all times.  Take care of the port as told by your health care provider. Ask your health care provider if you or a family member can get training for taking care of the port at home. A home health care nurse may also take care of the port.  Make sure to remember what type of port you have. Incision care  Follow instructions from your health care provider about how to take care of your port insertion site. Make sure you: ? Wash your hands with soap and water before and after you change your bandage (dressing). If soap and water are not available, use hand sanitizer. ? Change your dressing as told by your health care provider. ? Leave stitches (sutures), skin glue, or adhesive strips in place. These skin closures may need to stay in place for 2 weeks or longer. If adhesive strip edges start to loosen and curl up, you may trim the loose edges. Do not remove adhesive strips completely unless your health care provider tells you to do that.  Check your port insertion site every day for signs of infection. Check for: ? Redness, swelling, or pain. ? Fluid or blood. ? Warmth. ? Pus or a bad smell.      Activity  Return to your normal activities as told by your health care provider. Ask your health care provider what activities are safe for you.  Do not  lift anything that is heavier than 10 lb (4.5 kg), or the limit that you are told, until your health care provider says that it is safe. General instructions  Take over-the-counter and prescription medicines only as told by your health care provider.  Do not take baths, swim, or use a hot tub until your health care provider approves. Ask your health care provider if you may take showers. You may only be allowed to take sponge baths.  Do not drive for 24 hours if you were given a sedative during your procedure.  Wear a medical alert bracelet in case of an emergency. This will tell any health care providers that you have a port.  Keep all follow-up visits as told by your health care provider. This is important. Contact a health care provider if:  You cannot flush your port with saline as directed, or you cannot draw blood from the port.  You have a fever or chills.  You have redness, swelling, or pain around your port insertion site.  You have fluid or blood coming from your port insertion site.  Your port insertion site feels warm to the touch.  You have pus or a bad smell coming from the port insertion site. Get help right away if:  You have chest pain or shortness of breath.  You have bleeding from your port that you cannot control. Summary  Take care of the port as told by your   health care provider. Keep the manufacturer's information card with you at all times.  Change your dressing as told by your health care provider.  Contact a health care provider if you have a fever or chills or if you have redness, swelling, or pain around your port insertion site.  Keep all follow-up visits as told by your health care provider. This information is not intended to replace advice given to you by your health care provider. Make sure you discuss any questions you have with your health care provider. Document Revised: 12/06/2017 Document Reviewed: 12/06/2017 Elsevier Patient Education   2021 Elsevier Inc.  

## 2020-09-01 ENCOUNTER — Telehealth: Payer: Self-pay | Admitting: Internal Medicine

## 2020-09-01 NOTE — Telephone Encounter (Signed)
*  STAT* If patient is at the pharmacy, call can be transferred to refill team.   1. Which medications need to be refilled? (please list name of each medication and dose if known) rivaroxaban (XARELTO) 20 MG TABS tablet  2. Which pharmacy/location (including street and city if local pharmacy) is medication to be sent to? [id number] [place], [place] - [address]  3. Do they need a 30 day or 90 day supply? [place]

## 2020-09-02 ENCOUNTER — Other Ambulatory Visit: Payer: Self-pay | Admitting: Hematology and Oncology

## 2020-09-02 MED ORDER — RIVAROXABAN 20 MG PO TABS: 20.0000 mg | ORAL_TABLET | Freq: Every day | ORAL | 1 refills | Status: DC

## 2020-09-02 NOTE — Telephone Encounter (Signed)
58f, 117.1kg, scr 0.92 04/28/20, lovw/hilty 10/12/19, ccr 112.26ml/min

## 2020-09-30 ENCOUNTER — Inpatient Hospital Stay: Payer: BC Managed Care – PPO | Attending: Hematology and Oncology

## 2020-09-30 ENCOUNTER — Other Ambulatory Visit: Payer: Self-pay

## 2020-09-30 DIAGNOSIS — Z171 Estrogen receptor negative status [ER-]: Secondary | ICD-10-CM | POA: Diagnosis not present

## 2020-09-30 DIAGNOSIS — Z452 Encounter for adjustment and management of vascular access device: Secondary | ICD-10-CM | POA: Diagnosis present

## 2020-09-30 DIAGNOSIS — C50211 Malignant neoplasm of upper-inner quadrant of right female breast: Secondary | ICD-10-CM | POA: Insufficient documentation

## 2020-09-30 DIAGNOSIS — Z95828 Presence of other vascular implants and grafts: Secondary | ICD-10-CM

## 2020-09-30 MED ORDER — SODIUM CHLORIDE 0.9% FLUSH
10.0000 mL | Freq: Once | INTRAVENOUS | Status: AC
  Administered 2020-09-30: 10 mL
  Filled 2020-09-30: qty 10

## 2020-09-30 MED ORDER — HEPARIN SOD (PORK) LOCK FLUSH 100 UNIT/ML IV SOLN
500.0000 [IU] | Freq: Once | INTRAVENOUS | Status: AC
Start: 2020-09-30 — End: 2020-09-30
  Administered 2020-09-30: 500 [IU]
  Filled 2020-09-30: qty 5

## 2020-09-30 NOTE — Patient Instructions (Signed)
Implanted Port Insertion, Care After This sheet gives you information about how to care for yourself after your procedure. Your health care provider may also give you more specific instructions. If you have problems or questions, contact your health care provider. What can I expect after the procedure? After the procedure, it is common to have:  Discomfort at the port insertion site.  Bruising on the skin over the port. This should improve over 3-4 days. Follow these instructions at home: Port care  After your port is placed, you will get a manufacturer's information card. The card has information about your port. Keep this card with you at all times.  Take care of the port as told by your health care provider. Ask your health care provider if you or a family member can get training for taking care of the port at home. A home health care nurse may also take care of the port.  Make sure to remember what type of port you have. Incision care  Follow instructions from your health care provider about how to take care of your port insertion site. Make sure you: ? Wash your hands with soap and water before and after you change your bandage (dressing). If soap and water are not available, use hand sanitizer. ? Change your dressing as told by your health care provider. ? Leave stitches (sutures), skin glue, or adhesive strips in place. These skin closures may need to stay in place for 2 weeks or longer. If adhesive strip edges start to loosen and curl up, you may trim the loose edges. Do not remove adhesive strips completely unless your health care provider tells you to do that.  Check your port insertion site every day for signs of infection. Check for: ? Redness, swelling, or pain. ? Fluid or blood. ? Warmth. ? Pus or a bad smell.      Activity  Return to your normal activities as told by your health care provider. Ask your health care provider what activities are safe for you.  Do not  lift anything that is heavier than 10 lb (4.5 kg), or the limit that you are told, until your health care provider says that it is safe. General instructions  Take over-the-counter and prescription medicines only as told by your health care provider.  Do not take baths, swim, or use a hot tub until your health care provider approves. Ask your health care provider if you may take showers. You may only be allowed to take sponge baths.  Do not drive for 24 hours if you were given a sedative during your procedure.  Wear a medical alert bracelet in case of an emergency. This will tell any health care providers that you have a port.  Keep all follow-up visits as told by your health care provider. This is important. Contact a health care provider if:  You cannot flush your port with saline as directed, or you cannot draw blood from the port.  You have a fever or chills.  You have redness, swelling, or pain around your port insertion site.  You have fluid or blood coming from your port insertion site.  Your port insertion site feels warm to the touch.  You have pus or a bad smell coming from the port insertion site. Get help right away if:  You have chest pain or shortness of breath.  You have bleeding from your port that you cannot control. Summary  Take care of the port as told by your   health care provider. Keep the manufacturer's information card with you at all times.  Change your dressing as told by your health care provider.  Contact a health care provider if you have a fever or chills or if you have redness, swelling, or pain around your port insertion site.  Keep all follow-up visits as told by your health care provider. This information is not intended to replace advice given to you by your health care provider. Make sure you discuss any questions you have with your health care provider. Document Revised: 12/06/2017 Document Reviewed: 12/06/2017 Elsevier Patient Education   2021 Elsevier Inc.  

## 2020-12-10 ENCOUNTER — Other Ambulatory Visit: Payer: Self-pay

## 2020-12-10 DIAGNOSIS — C50211 Malignant neoplasm of upper-inner quadrant of right female breast: Secondary | ICD-10-CM

## 2020-12-30 ENCOUNTER — Inpatient Hospital Stay: Payer: BC Managed Care – PPO

## 2021-01-09 ENCOUNTER — Other Ambulatory Visit: Payer: Self-pay

## 2021-01-09 ENCOUNTER — Ambulatory Visit
Admission: RE | Admit: 2021-01-09 | Discharge: 2021-01-09 | Disposition: A | Discharge status: Home / Self Care | Payer: BC Managed Care – PPO | Source: Ambulatory Visit | Attending: Hematology and Oncology | Admitting: Hematology and Oncology

## 2021-01-09 ENCOUNTER — Other Ambulatory Visit: Payer: Self-pay | Admitting: Hematology and Oncology

## 2021-01-09 DIAGNOSIS — C50211 Malignant neoplasm of upper-inner quadrant of right female breast: Secondary | ICD-10-CM

## 2021-01-09 HISTORY — DX: Personal history of irradiation: Z92.3

## 2021-01-09 HISTORY — DX: Personal history of antineoplastic chemotherapy: Z92.21

## 2021-02-23 ENCOUNTER — Other Ambulatory Visit: Payer: Self-pay | Admitting: Internal Medicine

## 2021-02-23 NOTE — Telephone Encounter (Signed)
*  STAT* If patient is at the pharmacy, call can be transferred to refill team.   1. Which medications need to be refilled? (please list name of each medication and dose if known) Xarelto  2. Which pharmacy/location (including street and city if local pharmacy) is medication to be sent to? [facility] [place]  3. Do they need a 30 day or 90 day supply? [address] and refillsy

## 2021-02-23 NOTE — Telephone Encounter (Signed)
Prescription refill request for Xarelto received.  Last office visit:hilty 12/17/19 overdue  GEZMOQ:[id number] Age:71f Scr: 0.92 04/28/20 CrCl:112.7

## 2021-03-12 DIAGNOSIS — I5189 Other ill-defined heart diseases: Secondary | ICD-10-CM

## 2021-03-12 DIAGNOSIS — R600 Localized edema: Secondary | ICD-10-CM | POA: Diagnosis present

## 2021-03-25 ENCOUNTER — Other Ambulatory Visit: Payer: Self-pay | Admitting: Internal Medicine

## 2021-03-25 NOTE — Telephone Encounter (Signed)
I am confused if we are supposed to fill or if [facility] is supposed to fill. Routing to pharmd pool for clarification. Prescription refill request for Xarelto received.  Last office visit:hilty 11/2619 Weight:117.1kg Age:9f Scr:0.92 04/28/20 CrCl:112.7

## 2021-04-26 NOTE — Progress Notes (Signed)
Patient Care Team: Rankins, Bill Salinas, MD as PCP - General (Family Medicine) Erroll Luna, MD as Consulting Physician (General Surgery) Nicholas Lose, MD as Consulting Physician (Hematology and Oncology) Kyung Rudd, MD as Consulting Physician (Radiation Oncology)  DIAGNOSIS:    ICD-10-CM   1. Malignant neoplasm of upper-inner quadrant of right breast in female, estrogen receptor negative ([place])  C50.211    Z17.1       SUMMARY OF ONCOLOGIC HISTORY: Oncology History  Malignant neoplasm of upper-inner quadrant of right breast in female, estrogen receptor negative ([place])  01/02/2019 Initial Diagnosis   Routine screening mammogram detected a 1.2cm mass in the right breast at the 12:30 location 7 cm from the nipple, no axillary adenopathy. Biopsy showed IDC, grade 2-3, HER-2 - (1+), ER -, PR -, Ki67 80%.    01/03/2019 Cancer Staging   Staging form: Breast, AJCC 8th Edition - Clinical stage from 01/03/2019: Stage IB (cT1c, cN0, cM0, G3, ER-, PR-, HER2-)    03/01/2019 Surgery   Right breast lumpectomy ([place]) ([id number]): IDC, grade 3, 1.4cm, clear margins, 3 lymph nodes negative for carcinoma.    03/09/2019 Cancer Staging   Staging form: Breast, AJCC 8th Edition - Pathologic stage from 03/09/2019: Stage IB (pT1c, pN0, cM0, G3, ER-, PR-, HER2-)    03/27/2019 - 07/17/2019 Chemotherapy   DOXOrubicin (ADRIAMYCIN) chemo injection 118 mg, 50 mg/m2 = 118 mg (83.3 % of original dose 60 mg/m2), Intravenous,  Once, 4 of 4 cycles. Dose modification: 50 mg/m2 (original dose 60 mg/m2, Cycle 1, Reason: Provider Judgment), 45 mg/m2 (original dose 60 mg/m2, Cycle 3, Reason: Provider Judgment) Administration: 118 mg (03/27/2019), 118 mg (04/10/2019), 106 mg (04/24/2019), 106 mg (05/08/2019)  palonosetron (ALOXI) injection 0.25 mg, 0.25 mg, Intravenous,  Once, 12 of 15 cycles Administration: 0.25 mg (03/27/2019), 0.25 mg (04/10/2019), 0.25 mg (04/24/2019), 0.25 mg (05/08/2019), 0.25 mg (05/29/2019), 0.25  mg (06/06/2019), 0.25 mg (06/12/2019), 0.25 mg (06/19/2019), 0.25 mg (06/26/2019), 0.25 mg (07/03/2019), 0.25 mg (07/10/2019), 0.25 mg (07/17/2019)  pegfilgrastim-jmdb (FULPHILA) injection 6 mg, 6 mg, Subcutaneous,  Once, 4 of 4 cycles. Administration: 6 mg (03/29/2019), 6 mg (04/12/2019), 6 mg (04/26/2019), 6 mg (05/10/2019)  cyclophosphamide (CYTOXAN) 1,180 mg in sodium chloride 0.9 % 250 mL chemo infusion, 500 mg/m2 = 1,180 mg (83.3 % of original dose 600 mg/m2), Intravenous,  Once, 4 of 4 cycles Dose modification: 500 mg/m2 (original dose 600 mg/m2, Cycle 1, Reason: Provider Judgment), 450 mg/m2 (original dose 600 mg/m2, Cycle 3, Reason: Provider Judgment) Administration: 1,180 mg (03/27/2019), 1,180 mg (04/10/2019), 1,060 mg (04/24/2019), 1,060 mg (05/08/2019)  PACLitaxel (TAXOL) 192 mg in sodium chloride 0.9 % 250 mL chemo infusion (</= 63m/m2), 80 mg/m2 = 192 mg, Intravenous,  Once, 9 of 12 cycles Dose modification: 65 mg/m2 (original dose 80 mg/m2, Cycle 12, Reason: Dose not tolerated) Administration: 192 mg (05/22/2019), 192 mg (05/29/2019), 192 mg (06/06/2019), 192 mg (06/12/2019), 192 mg (06/19/2019), 192 mg (06/26/2019), 192 mg (07/03/2019), 156 mg (07/10/2019), 156 mg (07/17/2019)  fosaprepitant (EMEND) 150 mg, dexamethasone (DECADRON) 12 mg in sodium chloride 0.9 % 145 mL IVPB, , Intravenous,  Once, 4 of 4 cycles Administration:  (03/27/2019),  (04/10/2019),  (04/24/2019),  (05/08/2019)   08/15/2019 - 09/11/2019 Radiation Therapy   The patient initially received a dose of 42.56 Gy in 16 fractions to the breast using whole-breast tangent fields. This was delivered using a 3-D conformal technique. The patient then received a boost to the seroma. This delivered an additional 10 Gy in 327fctions using an en  face electron field due to the depth of the seroma. The total dose was 52.56Gy.     CHIEF COMPLIANT: Surveillance of triple negative right breast cancer  INTERVAL HISTORY: Kathleen Potter is a 65 y.o.  with above-mentioned history of triple negative right breast cancer who underwent a lumpectomy, adjuvant chemotherapy, radiation, and is currently on surviellance. Mammogram on 01/09/2021 postoperative changes in the right breast and curvilinear calcifications favored to be related to fat necrosis at the lumpectomy site. She presents to the clinic today for follow-up.   ALLERGIES:  is allergic to latex.  MEDICATIONS:  Current Outpatient Medications  Medication Sig Dispense Refill   Cholecalciferol (VITAMIN D) 50 MCG (2000 UT) tablet Take 2,000 Units by mouth daily.     Cyanocobalamin (B-12-SL) 1000 MCG SUBL Place 1,000 mcg under the tongue daily.     furosemide (LASIX) 20 MG tablet TAKE 1 TABLET BY MOUTH EVERY DAY 30 tablet 1   lidocaine-prilocaine (EMLA) cream Apply to affected area once 30 g 3   potassium chloride (KLOR-CON) 10 MEQ tablet TAKE 1 TABLET BY MOUTH EVERY DAY 30 tablet 6   pyridOXINE (VITAMIN B-6) 100 MG tablet Take 100 mg by mouth 3 (three) times a week.     rivaroxaban (XARELTO) 20 MG TABS tablet Take 1 tablet (20 mg total) by mouth daily with supper. Appointment needed with cardiologist for further refills 30 tablet 0   TURMERIC CURCUMIN PO Take 1 tablet by mouth 3 (three) times a week.     No current facility-administered medications for this visit.    PHYSICAL EXAMINATION: ECOG PERFORMANCE STATUS: 1 - Symptomatic but completely ambulatory  Vitals:   04/27/21 1354  BP: (!) 165/81  Pulse: 77  Resp: 18  Temp: (!) 97.3 F (36.3 C)  SpO2: 99%   Filed Weights   04/27/21 1354  Weight: 256 lb 6.4 oz (116.3 kg)    BREAST: No palpable masses or nodules in either right or left breasts. No palpable axillary supraclavicular or infraclavicular adenopathy no breast tenderness or nipple discharge. (exam performed in the presence of a chaperone)  LABORATORY DATA:  I have reviewed the data as listed CMP Latest Ref Rng & Units 04/28/2020 01/21/2020 12/09/2019  Glucose 70 - 99  mg/dL 85 91 92  BUN 8 - 23 mg/dL 14 13 12   Creatinine 0.44 - 1.00 mg/dL 0.92 0.91 0.84  Sodium 135 - 145 mmol/L 141 139 138  Potassium 3.5 - 5.1 mmol/L 3.8 3.8 3.8  Chloride 98 - 111 mmol/L 109 105 106  CO2 22 - 32 mmol/L 26 28 24   Calcium 8.9 - 10.3 mg/dL 9.1 10.0 9.0  Total Protein 6.5 - 8.1 g/dL 6.9 7.4 -  Total Bilirubin 0.3 - 1.2 mg/dL 0.4 0.4 -  Alkaline Phos 38 - 126 U/L 75 82 -  AST 15 - 41 U/L 23 25 -  ALT 0 - 44 U/L 25 27 -    Lab Results  Component Value Date   WBC 4.5 04/28/2020   HGB 11.9 (L) 04/28/2020   HCT 37.6 04/28/2020   MCV 73.7 (L) 04/28/2020   PLT 169 04/28/2020   NEUTROABS 2.6 04/28/2020    ASSESSMENT & PLAN:  Malignant neoplasm of upper-inner quadrant of right breast in female, estrogen receptor negative ([place]) 01/02/2019:Routine screening mammogram detected a 1.2cm mass in the right breast at the 12:30 location 7 cm from the nipple, no axillary adenopathy. Biopsy showed IDC, grade 2-3, HER-2 - (1+), ER -, PR -, Ki67 80%.  03/01/2019: Right lumpectomy: Grade 3 IDC, 1.4 cm, margins clear, 0/3 lymph nodes negative, triple negative stage IIb   Treatment plan: 1. Adjuvant chemotherapy with dose dense Adriamycin and Cytoxan x4 followed by Taxol weekly x9 (completed February 2021) 2. Followed by radiation therapy completed 09/11/2019   Patient enrolled in [place] 1714 clinical trial -----------------------------------------------------------------------------------------------------------------------------------------------------  Surveillance:  Breast exams 04/27/2021: Benign Mammograms:01/09/21: Benign Density Cat B, fat necrosis at the surgical site 40-monthfollow-up mammogram recommended.  This will be done in February 2023.   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 I recommended participation in the phase 2 ACCRU [place] 2102 study of N-Palmitoylrthanolamide vs placebo for the treatment of chemo induced peripheral neuropathy.     Lower extremity edema: No clear etiology.  Currently on Eliquis because of the blood clot at the tip of the port. 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  Could be thrombus or tumor. [facility] mass is still detected but the did not measure it. Patient is being followed by cardiology for this.  I sent a message to Dr. CBrantley Stageto remove her port. She is extremely exhausted from working at [employer].  She does and that she is working for [employer] has tremendous amount of workload and she is staying till 9:00 at night and finishing her work.  She is planning to retire next year.  Return to clinic in 1 year for follow-up    No orders of the defined types were placed in this encounter.  The patient has a good understanding of the overall plan. she agrees with it. she will call with any problems that may develop before the next visit here.  Total time spent: 20 mins including face to face time and time spent for planning, charting and coordination of care  VRulon Eisenmenger MD, MPH 04/27/2021  I, KThana Ates am acting as scribe for Dr. VNicholas Lose  I have reviewed the above documentation for accuracy and completeness, and I agree with the above.

## 2021-04-26 NOTE — Assessment & Plan Note (Addendum)
01/02/2019:Routine screening mammogram detected a 1.2cm mass in the right breast at the 12:30 location 7 cm from the nipple, no axillary adenopathy. Biopsy showed IDC, grade 2-3, HER-2 - (1+), ER -, PR -, Ki67 80%.  03/01/2019: Right lumpectomy: Grade 3 IDC, 1.4 cm, margins clear, 0/3 lymph nodes negative, triple negative stage IIb  Treatment plan: 1.Adjuvant chemotherapy with dose dense Adriamycin and Cytoxan x4 followed by Taxol weekly x9 (completed February 2021) 2.Followed by radiation therapycompleted 09/11/2019  Patient enrolled in [place] 1714 clinical trial ----------------------------------------------------------------------------------------------------------------------------------------------------- Surveillance:  Breast exams12/09/2020: Benign Mammograms:01/09/21: Benign Density Cat B  Chemo-induced peripheral neuropathy: Continues to limit her activities. I encouraged her to exercise and walk regularly. The neuropathy symptoms are getting better with time.  Lower extremity edema: No clear etiology. Currently on Eliquis because of the blood clot at the tip of the port. 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 Could be thrombus or tumor. TEE August 2021 at [facility] of the right atrial mass is still detected but the did not measure it. Patient is being followed by cardiology for this.  I will arrange for her to get the port flushes until she gets her port removed. Return to clinic in 1 year for follow-up

## 2021-04-27 ENCOUNTER — Other Ambulatory Visit: Payer: Self-pay

## 2021-04-27 ENCOUNTER — Inpatient Hospital Stay: Payer: BC Managed Care – PPO | Attending: Hematology and Oncology | Admitting: Hematology and Oncology

## 2021-04-27 ENCOUNTER — Encounter: Payer: Self-pay | Admitting: *Deleted

## 2021-04-27 ENCOUNTER — Telehealth: Payer: Self-pay | Admitting: Emergency Medicine

## 2021-04-27 DIAGNOSIS — Z171 Estrogen receptor negative status [ER-]: Secondary | ICD-10-CM | POA: Diagnosis not present

## 2021-04-27 DIAGNOSIS — Z7901 Long term (current) use of anticoagulants: Secondary | ICD-10-CM | POA: Insufficient documentation

## 2021-04-27 DIAGNOSIS — Z923 Personal history of irradiation: Secondary | ICD-10-CM | POA: Insufficient documentation

## 2021-04-27 DIAGNOSIS — C50211 Malignant neoplasm of upper-inner quadrant of right female breast: Secondary | ICD-10-CM | POA: Diagnosis not present

## 2021-04-27 DIAGNOSIS — Z9221 Personal history of antineoplastic chemotherapy: Secondary | ICD-10-CM | POA: Insufficient documentation

## 2021-04-27 MED ORDER — POTASSIUM CHLORIDE ER 10 MEQ PO TBCR: 10.0000 meq | EXTENDED_RELEASE_TABLET | Freq: Every day | ORAL | 11 refills | Status: DC

## 2021-04-27 MED ORDER — POTASSIUM CHLORIDE ER 10 MEQ PO TBCR: 10.0000 meq | EXTENDED_RELEASE_TABLET | Freq: Every day | ORAL | 3 refills | Status: DC

## 2021-04-27 MED ORDER — FUROSEMIDE 20 MG PO TABS: 20.0000 mg | ORAL_TABLET | Freq: Every day | ORAL | 3 refills | Status: DC

## 2021-04-27 MED ORDER — FUROSEMIDE 20 MG PO TABS
20.0000 mg | ORAL_TABLET | Freq: Every day | ORAL | 11 refills | Status: DC
Start: 2021-04-27 — End: 2021-04-27

## 2021-04-27 NOTE — Progress Notes (Signed)
Per MD request RN sent in-basket message to Dr. Brantley Stage and RN regarding pt requesting port a cath removal.

## 2021-04-27 NOTE — Research (Signed)
ACCRU-[place]-2102 - TREATMENT OF ESTABLISHED CHEMOTHERAPY-INDUCED NEUROPATHY WITH N-PALMITOYLETHANOLAMIDE, A CANNABIMIMETIC NUTRACEUTICAL: A RANDOMIZED DOUBLE-BLIND PHASE II PILOT TRIAL    Patient Kathleen Potter was identified by Dr Lindi Adie as a potential candidate for the above listed study.  This Clinical Research Coordinator spoke with Kathleen Potter via phone, [phone], on 04/27/21 in a manner and location that ensures patient privacy to discuss participation in the above listed research study. A copy of the informed consent document with embedded HIPAA language was mailed to the patient at her request, as she was at work and unable to speak with our team at the time.  Patient reads, speaks, and understands [place].    Pt was mailed the business card of this Nurse and CRC Kathleen Potter, and she was encouraged to contact the research team with any questions.   Plan made for pt to call Kathleen Potter back when she can speak.  Kathleen Skiff Sharniece Gibbon, RN, BSN, [facility] She  Her  Hers Clinical Research Nurse [place] [phone]  Pager [phone] 04/27/2021 4:34 PM

## 2021-04-27 NOTE — Telephone Encounter (Signed)
ACCRU-[place]-2102 - TREATMENT OF ESTABLISHED CHEMOTHERAPY-INDUCED NEUROPATHY WITH N-PALMITOYLETHANOLAMIDE, A CANNABIMIMETIC NUTRACEUTICAL: A RANDOMIZED DOUBLE-BLIND PHASE II PILOT TRIAL  04/27/21  4:00pm: Called to discuss this study with the patient.  Patient states this was not a good time for her to to discuss the study.  She was given this coordinator's contact information and the patient states she will call back at a better time.  Patient agreed to have consent documents mailed to her home address in the meantime.   Clabe Seal Clinical Research Coordinator I  04/27/21  4:09 PM

## 2021-04-29 ENCOUNTER — Ambulatory Visit: Payer: BC Managed Care – PPO | Admitting: Hematology and Oncology

## 2021-05-04 ENCOUNTER — Telehealth: Payer: Self-pay

## 2021-05-04 NOTE — Telephone Encounter (Signed)
ACCRU-[place]-2102 - TREATMENT OF ESTABLISHED CHEMOTHERAPY-INDUCED NEUROPATHY WITH N-PALMITOYLETHANOLAMIDE, A CANNABIMIMETIC NUTRACEUTICAL: A RANDOMIZED DOUBLE-BLIND PHASE II PILOT TRIAL   Called Ms Viti back to follow up on her call with Clabe Seal last week. She is at work and unable to talk now. Confirmed she did receive the consents. Requested she call us back & provided her w Kory's phone number.  Marjie Skiff Crucita Lacorte, RN, BSN, [facility] She  Her  Hers Clinical Research Nurse [facility] Direct Dial [phone]  Pager [phone] 05/04/2021 10:29 AM

## 2021-06-08 ENCOUNTER — Telehealth: Payer: Self-pay

## 2021-06-08 NOTE — Telephone Encounter (Signed)
Attempted to call pt to discuss clinical research study for CIPN (ACCRU-[place]-2102 - TREATMENT OF ESTABLISHED CHEMOTHERAPY-INDUCED NEUROPATHY WITH N-PALMITOYLETHANOLAMIDE, A CANNABIMIMETIC NUTRACEUTICAL: A RANDOMIZED DOUBLE-BLIND PHASE II PILOT TRIAL ).   No answer, and no voicemail set up, so unable to leave a message. Research has spoken with her twice, and both times she has stated she was unable to speak. She has received our contact information via phone and via email with no response regarding interest in the study. Pt does not use MyChart.  Plan to continue to attempt contact with patient.  Marjie Skiff Kassim Guertin, RN, BSN, [facility] She   Her   Hers Clinical Research Nurse [facility] Direct Dial [phone]   Pager [phone] 06/08/2021 2:23 PM

## 2021-06-19 ENCOUNTER — Telehealth: Payer: Self-pay

## 2021-06-19 NOTE — Telephone Encounter (Signed)
ACCRU-[place]-2102 - TREATMENT OF ESTABLISHED CHEMOTHERAPY-INDUCED NEUROPATHY WITH N-PALMITOYLETHANOLAMIDE, A CANNABIMIMETIC NUTRACEUTICAL: A RANDOMIZED DOUBLE-BLIND PHASE II PILOT TRIAL  Attempted to reach patient regarding ACCRU study; referral was made to research in December. It has been 8 weeks, and pt has not called our team back. Consents were sent to her to review in December, and she has our contact information. At this time, she will be a screen fail.  If she calls the Research Department or Dr Geralyn Flash office, we will be happy to speak with her.  Marjie Skiff Hina Gupta, RN, BSN, [facility] She   Her   Hers Clinical Research Nurse [facility] Direct Dial [phone]   Pager [phone] 06/19/2021 10:24 AM

## 2021-07-13 ENCOUNTER — Encounter: Payer: Self-pay | Admitting: Hematology and Oncology

## 2021-07-13 ENCOUNTER — Other Ambulatory Visit: Payer: Self-pay | Admitting: Hematology and Oncology

## 2021-07-13 ENCOUNTER — Ambulatory Visit
Admission: RE | Admit: 2021-07-13 | Discharge: 2021-07-13 | Disposition: A | Discharge status: Home / Self Care | Payer: BC Managed Care – PPO | Source: Ambulatory Visit | Attending: Hematology and Oncology | Admitting: Hematology and Oncology

## 2021-07-13 DIAGNOSIS — C50211 Malignant neoplasm of upper-inner quadrant of right female breast: Secondary | ICD-10-CM

## 2021-08-27 ENCOUNTER — Other Ambulatory Visit: Payer: Self-pay

## 2021-08-27 ENCOUNTER — Observation Stay (HOSPITAL_COMMUNITY)
Admission: EM | Admit: 2021-08-27 | Discharge: 2021-08-28 | Disposition: A | Discharge status: Home / Self Care | Payer: BC Managed Care – PPO | Attending: Family Medicine | Admitting: Family Medicine

## 2021-08-27 ENCOUNTER — Encounter (HOSPITAL_COMMUNITY): Payer: Self-pay

## 2021-08-27 DIAGNOSIS — R609 Edema, unspecified: Secondary | ICD-10-CM | POA: Diagnosis not present

## 2021-08-27 DIAGNOSIS — D62 Acute posthemorrhagic anemia: Secondary | ICD-10-CM | POA: Diagnosis not present

## 2021-08-27 DIAGNOSIS — E66813 Obesity, class 3: Secondary | ICD-10-CM | POA: Diagnosis present

## 2021-08-27 DIAGNOSIS — I5189 Other ill-defined heart diseases: Secondary | ICD-10-CM

## 2021-08-27 DIAGNOSIS — G620 Drug-induced polyneuropathy: Secondary | ICD-10-CM | POA: Insufficient documentation

## 2021-08-27 DIAGNOSIS — Z171 Estrogen receptor negative status [ER-]: Secondary | ICD-10-CM

## 2021-08-27 DIAGNOSIS — C50211 Malignant neoplasm of upper-inner quadrant of right female breast: Secondary | ICD-10-CM | POA: Insufficient documentation

## 2021-08-27 DIAGNOSIS — I10 Essential (primary) hypertension: Secondary | ICD-10-CM | POA: Diagnosis not present

## 2021-08-27 DIAGNOSIS — K921 Melena: Secondary | ICD-10-CM

## 2021-08-27 DIAGNOSIS — Z7901 Long term (current) use of anticoagulants: Secondary | ICD-10-CM | POA: Insufficient documentation

## 2021-08-27 DIAGNOSIS — K922 Gastrointestinal hemorrhage, unspecified: Principal | ICD-10-CM | POA: Diagnosis present

## 2021-08-27 DIAGNOSIS — Z79899 Other long term (current) drug therapy: Secondary | ICD-10-CM | POA: Diagnosis not present

## 2021-08-27 DIAGNOSIS — Z91040 Latex allergy status: Secondary | ICD-10-CM | POA: Insufficient documentation

## 2021-08-27 DIAGNOSIS — K625 Hemorrhage of anus and rectum: Secondary | ICD-10-CM | POA: Diagnosis present

## 2021-08-27 DIAGNOSIS — R600 Localized edema: Secondary | ICD-10-CM | POA: Diagnosis present

## 2021-08-27 HISTORY — DX: Unspecified hemorrhoids: K64.9

## 2021-08-27 LAB — TYPE AND SCREEN
ABO/RH(D): O POS
Antibody Screen: NEGATIVE

## 2021-08-27 LAB — CBC
HCT: 36.8 % (ref 36.0–46.0)
Hemoglobin: 11.7 g/dL — ABNORMAL LOW (ref 12.0–15.0)
MCH: 23.9 pg — ABNORMAL LOW (ref 26.0–34.0)
MCHC: 31.8 g/dL (ref 30.0–36.0)
MCV: 75.1 fL — ABNORMAL LOW (ref 80.0–100.0)
Platelets: 180 10*3/uL (ref 150–400)
RBC: 4.9 MIL/uL (ref 3.87–5.11)
RDW: 16.7 % — ABNORMAL HIGH (ref 11.5–15.5)
WBC: 5.5 10*3/uL (ref 4.0–10.5)
nRBC: 0 % (ref 0.0–0.2)

## 2021-08-27 LAB — COMPREHENSIVE METABOLIC PANEL
ALT: 28 U/L (ref 0–44)
AST: 24 U/L (ref 15–41)
Albumin: 3.9 g/dL (ref 3.5–5.0)
Alkaline Phosphatase: 55 U/L (ref 38–126)
Anion gap: 7 (ref 5–15)
BUN: 19 mg/dL (ref 8–23)
CO2: 24 mmol/L (ref 22–32)
Calcium: 9 mg/dL (ref 8.9–10.3)
Chloride: 110 mmol/L (ref 98–111)
Creatinine, Ser: 1 mg/dL (ref 0.44–1.00)
GFR, Estimated: >60 mL/min (ref >60)
Glucose, Bld: 118 mg/dL — ABNORMAL HIGH (ref 70–99)
Potassium: 3.7 mmol/L (ref 3.5–5.1)
Sodium: 141 mmol/L (ref 135–145)
Total Bilirubin: 0.2 mg/dL — ABNORMAL LOW (ref 0.3–1.2)
Total Protein: 7.3 g/dL (ref 6.5–8.1)

## 2021-08-27 LAB — HEMOGLOBIN AND HEMATOCRIT, BLOOD
HCT: 35 % — ABNORMAL LOW (ref 36.0–46.0)
Hemoglobin: 11.2 g/dL — ABNORMAL LOW (ref 12.0–15.0)

## 2021-08-27 LAB — ABO/RH: ABO/RH(D): O POS

## 2021-08-27 MED ORDER — ACETAMINOPHEN 325 MG PO TABS: 650.0000 mg | ORAL_TABLET | Freq: Four times a day (QID) | ORAL | Status: DC | PRN

## 2021-08-27 MED ORDER — POTASSIUM CHLORIDE ER 10 MEQ PO TBCR
10.0000 meq | EXTENDED_RELEASE_TABLET | Freq: Every day | ORAL | Status: DC
  Administered 2021-08-28: 10 meq via ORAL
  Filled 2021-08-27 (×3): qty 1

## 2021-08-27 MED ORDER — LACTATED RINGERS IV SOLN: INTRAVENOUS | Status: DC

## 2021-08-27 MED ORDER — FUROSEMIDE 40 MG PO TABS
20.0000 mg | ORAL_TABLET | Freq: Every day | ORAL | Status: DC
  Administered 2021-08-28: 20 mg via ORAL
  Filled 2021-08-27: qty 1

## 2021-08-27 MED ORDER — ACETAMINOPHEN 650 MG RE SUPP: 650.0000 mg | Freq: Four times a day (QID) | RECTAL | Status: DC | PRN

## 2021-08-27 MED ORDER — LIDOCAINE-PRILOCAINE 2.5-2.5 % EX CREA: TOPICAL_CREAM | CUTANEOUS | Status: DC | PRN

## 2021-08-27 MED ORDER — VITAMIN B-6 100 MG PO TABS
100.0000 mg | ORAL_TABLET | ORAL | Status: DC
  Administered 2021-08-28: 100 mg via ORAL
  Filled 2021-08-27: qty 1

## 2021-08-27 MED ORDER — VITAMIN D3 25 MCG (1000 UNIT) PO TABS
2000.0000 [IU] | ORAL_TABLET | ORAL | Status: DC
  Administered 2021-08-28: 2000 [IU] via ORAL
  Filled 2021-08-27: qty 2

## 2021-08-27 MED ORDER — VITAMIN B-12 1000 MCG PO TABS
1000.0000 ug | ORAL_TABLET | Freq: Every day | ORAL | Status: DC
  Administered 2021-08-28: 1000 ug via ORAL
  Filled 2021-08-27: qty 1

## 2021-08-27 NOTE — H&P (Signed)
?History and Physical  ? ? ?Patient: Kathleen Potter NWG:[id number] DOB: 1955-09-28 ?DOA: 08/27/2021 ?DOS: the patient was seen and examined on 08/27/2021 ?PCP: [facility] [place]  ?Patient coming from: Home ? ?Chief Complaint:  ?Chief Complaint  ?Patient presents with  ? Rectal Bleeding  ? ?HPI: Kathleen Potter is a 66 y.o. female with medical history significant of atrial mass, hypertension, bilateral lower extremity edema, class III obesity, right breast cancer who is coming to the emergency department with complaints of having multiple episodes of hematochezia since yesterday.  She has had hemorrhoidal bleeds in the past but never as pronounced as this episode.  Denied abdominal pain, nausea, emesis, diarrhea, constipation or melena.  No flank pain, dysuria, frequency or hematuria.  No fever, chills, sore throat, dyspnea, wheezing, hemoptysis, chest pain, palpitations, diaphoresis, PND, orthopnea, but stated she gets frequent lower extremity edema. ? ?ED course: Initial vital signs were temperature 98.6 ?F, pulse 83, respiration 17, BP 152/105 mmHg O2 sat 97% on room air. ? ?Lab work: CBC is her white count 5.5, hemoglobin 11.7 g/dL platelets 180.  CMP with a glucose of 118 and total bilirubin 0.2 mg/dL.  The rest of the CMP was unremarkable. ?  ?Review of Systems: As mentioned in the history of present illness. All other systems reviewed and are negative. ?Past Medical History:  ?Diagnosis Date  ? Family history of breast cancer   ? Family history of prostate cancer   ? Hemorrhoids   ? Hypertension   ? Pt states her BP has been running high lately.  ? Personal history of chemotherapy   ? Personal history of radiation therapy   ? ?Past Surgical History:  ?Procedure Laterality Date  ? BREAST BIOPSY Right   ? BREAST LUMPECTOMY    ? BREAST LUMPECTOMY WITH RADIOACTIVE SEED AND SENTINEL LYMPH NODE BIOPSY Right 03/01/2019  ? Procedure: RIGHT BREAST LUMPECTOMY WITH RADIOACTIVE SEED;  Surgeon:  Erroll Luna, MD;  Location: [place];  Service: General;  Laterality: Right;  ? PARTIAL HYSTERECTOMY    ? PORTACATH PLACEMENT N/A 03/01/2019  ? Procedure: INSERTION PORT-A-CATH WITH ULTRASOUND;  Surgeon: Erroll Luna, MD;  Location: [place];  Service: General;  Laterality: N/A;  ? SENTINEL NODE BIOPSY Right 03/01/2019  ? Procedure: Right Sentinel Lymph Node Biopsy;  Surgeon: Erroll Luna, MD;  Location: [place];  Service: General;  Laterality: Right;  ? TEE WITHOUT CARDIOVERSION N/A 10/16/2019  ? Procedure: TRANSESOPHAGEAL ECHOCARDIOGRAM (TEE);  Surgeon: Pixie Casino, MD;  Location: [place];  Service: Cardiovascular;  Laterality: N/A;  ? ?Social History:  reports that she has never smoked. She has never used smokeless tobacco. She reports that she does not currently use alcohol. She reports that she does not use drugs. ? ?Allergies  ?Allergen Reactions  ? Latex Itching  ? ? ?Family History  ?Problem Relation Age of Onset  ? Breast cancer Sister 36  ? Breast cancer Maternal Aunt   ?     dx >50  ? Prostate cancer Paternal Uncle   ?     dx. early-mid 70s, not metastatic  ? ? ?Prior to Admission medications   ?Medication Sig Start Date End Date Taking? Authorizing Provider  ?Cholecalciferol (VITAMIN D) 50 MCG (2000 UT) tablet Take 2,000 Units by mouth daily.    [provider]  ?Cyanocobalamin (B-12-SL) 1000 MCG SUBL Place 1,000 mcg under the tongue daily.    [provider]  ?furosemide (LASIX) 20 MG tablet Take 1 tablet (20  mg total) by mouth daily. 04/27/21   Nicholas Lose, MD  ?lidocaine-prilocaine (EMLA) cream Apply to affected area once 06/19/19   Nicholas Lose, MD  ?potassium chloride (KLOR-CON) 10 MEQ tablet Take 1 tablet (10 mEq total) by mouth daily. 04/27/21   Nicholas Lose, MD  ?pyridOXINE (VITAMIN B-6) 100 MG tablet Take 100 mg by mouth 3 (three) times a week.    [provider]  ?rivaroxaban (XARELTO) 20 MG TABS tablet Take 1 tablet (20 mg total) by mouth daily with  supper. Appointment needed with cardiologist for further refills 02/23/21   Pixie Casino, MD  ?TURMERIC CURCUMIN PO Take 1 tablet by mouth 3 (three) times a week.    [provider]  ? ? ?Physical Exam: ?Vitals:  ? 08/27/21 0445 08/27/21 0600 08/27/21 0700 08/27/21 0800  ?BP: 135/71 138/79 (!) 144/76 140/77  ?Pulse: 67 78 60 63  ?Resp: 18 20    ?Temp:  97.6 ?F (36.4 ?C)    ?TempSrc:  Oral    ?SpO2: 96% 99% 98% 100%  ?Weight:      ?Height:      ? ?Physical Exam ?Vitals and nursing note reviewed.  ?Constitutional:   ?   Appearance: Normal appearance. She is obese.  ?HENT:  ?   Head: Normocephalic.  ?   Mouth/Throat:  ?   Mouth: Mucous membranes are moist.  ?Eyes:  ?   General: No scleral icterus. ?   Pupils: Pupils are equal, round, and reactive to light.  ?Neck:  ?   Vascular: No JVD.  ?Cardiovascular:  ?   Rate and Rhythm: Normal rate and regular rhythm.  ?   Heart sounds: S1 normal and S2 normal.  ?Pulmonary:  ?   Effort: Pulmonary effort is normal.  ?   Breath sounds: Normal breath sounds. No wheezing, rhonchi or rales.  ?Abdominal:  ?   General: There is no distension.  ?   Palpations: Abdomen is soft.  ?   Tenderness: There is no abdominal tenderness.  ?Musculoskeletal:  ?   Cervical back: Neck supple.  ?   Right lower leg: No edema.  ?   Left lower leg: No edema.  ?Skin: ?   General: Skin is warm and dry.  ?Neurological:  ?   General: No focal deficit present.  ?   Mental Status: She is alert and oriented to person, place, and time.  ?Psychiatric:     ?   Mood and Affect: Mood normal.     ?   Behavior: Behavior normal.  ? ?10/16/2019 TEE ?IMPRESSIONS  ? ? 1. Left ventricular ejection fraction, by estimation, is 60 to 65%. The  ?left ventricle has normal function. The left ventricle has no regional  ?wall motion abnormalities. There is mild left ventricular hypertrophy.  ? 2. Right ventricular systolic function is normal. The right ventricular  ?size is normal.  ? 3. No left atrial/left atrial  appendage thrombus was detected.  ? 4. Large 2.0 x 1.5 cm right right atrial mass adherent to the atrial wall  ?by the junction of the IVC. 3D imaging suggests this is not associated  ?with the catheter tip.  ? 5. The mitral valve is normal in structure. Trivial mitral valve  ?regurgitation.  ? 6. The aortic valve is tricuspid. Aortic valve regurgitation is not  ?visualized.  ?Data Reviewed: ? ?Results are pending, will review when available. ? ?Assessment and Plan: ?Principal Problem: ?  Acute blood loss anemia ?Due to ?  Acute  lower GI bleeding ?Observation/PCU. ?Clear liquid diet. ?N.p.o. after midnight. ?Hold rivaroxaban. ?Monitor hematocrit and hemoglobin. ?Transfuse as needed. ?GI consult appreciated. ? ?Active Problems: ?  Malignant neoplasm of upper-inner quadrant ?of right breast in female, estrogen receptor negative ([place]) ?Follow-up with Dr. Lindi Adie as scheduled. ? ?  Obesity, Class III, BMI 40-49.9 (morbid obesity) ([place]) ?Lifestyle modifications. ?Weight loss would decrease risk of recurrence. ?Follow-up with PCP and primary oncologist. ? ?  Right atrial mass ?Xarelto has been held. ? ?  Bilateral leg edema ?Continue furosemide 20 mg p.o. twice daily. ? ? ? Advance Care Planning:   Code Status: Full Code  ? ?Consults: Wilford Corner, MD. ? ?Family Communication:  ? ?Severity of Illness: ?The appropriate patient status for this patient is OBSERVATION. Observation status is judged to be reasonable and necessary in order to provide the required intensity of service to ensure the patient's safety. The patient's presenting symptoms, physical exam findings, and initial radiographic and laboratory data in the context of their medical condition is felt to place them at decreased risk for further clinical deterioration. Furthermore, it is anticipated that the patient will be medically stable for discharge from the hospital within 2 midnights of admission.  ? ?Author: ?Reubin Milan, MD ?08/27/2021 8:30  AM ? ?For on call review [email or web address].  ? ?This document was prepared using Dragon voice recognition software and may contain some unintended transcription errors. ?

## 2021-08-27 NOTE — Consult Note (Signed)
Referring Provider: TR[facility] ?Primary Care Physician:  [facility] ?Primary Gastroenterologist:  Althia Forts ? ?Reason for Consultation:  Hematochezia ? ?HPI: Kathleen Potter is a 66 y.o. female  ? ? ?She presented to the ED for one episode of large volume bright red blood per rectum yesterday. She denies rectal pain. She then had one episode of dark red clots passing this morning. Prior to yesterday she had noted spotting for 2 days, notes the blood was darker red in color but only a few drops on toilet paper.  ? ?She normally has daily bowel movements, well formed soft bowel movements. Last bowel movement Tuesday was normal.  ? ?She has a history of hemorrhoids that she manages at home with witch hazel wipes.  ? ?She denies, nausea, vomiting, constipation, diarrhea and abdominal pain.  ? 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 Most recent dose of Xarelto yesterday morning.   ? ?Hemoglobin reportedly stable at 11, which is her baseline.  ? ?Previous colonoscopy many years ago in [place] per patient she had hemorrhoids and no polyps were removed she is due for repeat colonoscopy.  ? ?Denies family history of colon cancer. Denies frequent NSAID use.  ? ? ?Past Medical History:  ?Diagnosis Date  ? Family history of breast cancer   ? Family history of prostate cancer   ? Hemorrhoids   ? Hypertension   ? Pt states her BP has been running high lately.  ? Personal history of chemotherapy   ? Personal history of radiation therapy   ? ? ?Past Surgical History:  ?Procedure Laterality Date  ? BREAST BIOPSY Right   ? BREAST LUMPECTOMY    ? BREAST LUMPECTOMY WITH RADIOACTIVE SEED AND SENTINEL LYMPH NODE BIOPSY Right 03/01/2019  ? Procedure: RIGHT BREAST LUMPECTOMY WITH RADIOACTIVE SEED;  Surgeon: Erroll Luna, MD;  Location: [facility];  Service: General;  Laterality: Right;  ? PARTIAL HYSTERECTOMY    ? PORTACATH PLACEMENT N/A 03/01/2019  ? Procedure: INSERTION PORT-A-CATH WITH ULTRASOUND;   Surgeon: Erroll Luna, MD;  Location: [facility];  Service: General;  Laterality: N/A;  ? SENTINEL NODE BIOPSY Right 03/01/2019  ? Procedure: Right Sentinel Lymph Node Biopsy;  Surgeon: Erroll Luna, MD;  Location: [facility];  Service: General;  Laterality: Right;  ? TEE WITHOUT CARDIOVERSION N/A 10/16/2019  ? Procedure: TRANSESOPHAGEAL ECHOCARDIOGRAM (TEE);  Surgeon: Pixie Casino, MD;  Location: [facility];  Service: Cardiovascular;  Laterality: N/A;  ? ? ?Prior to Admission medications   ?Medication Sig Start Date End Date Taking? Authorizing Provider  ?Cholecalciferol (VITAMIN D) 50 MCG (2000 UT) tablet Take 2,000 Units by mouth daily.    [provider]  ?Cyanocobalamin (B-12-SL) 1000 MCG SUBL Place 1,000 mcg under the tongue daily.    [provider]  ?furosemide (LASIX) 20 MG tablet Take 1 tablet (20 mg total) by mouth daily. 04/27/21   Nicholas Lose, MD  ?lidocaine-prilocaine (EMLA) cream Apply to affected area once 06/19/19   Nicholas Lose, MD  ?potassium chloride (KLOR-CON) 10 MEQ tablet Take 1 tablet (10 mEq total) by mouth daily. 04/27/21   Nicholas Lose, MD  ?pyridOXINE (VITAMIN B-6) 100 MG tablet Take 100 mg by mouth 3 (three) times a week.    [provider]  ?rivaroxaban (XARELTO) 20 MG TABS tablet Take 1 tablet (20 mg total) by mouth daily with supper. Appointment needed with cardiologist for further refills 02/23/21   Pixie Casino, MD  ?TURMERIC CURCUMIN PO Take 1 tablet by  mouth 3 (three) times a week.    [provider]  ? ? ?Scheduled Meds: ?Continuous Infusions: ?PRN Meds:.acetaminophen **OR** acetaminophen ? ?Allergies as of 08/27/2021 - Review Complete 08/27/2021  ?Allergen Reaction Noted  ? Latex Itching 01/03/2019  ? ? ?Family History  ?Problem Relation Age of Onset  ? Breast cancer Sister 59  ? Breast cancer Maternal Aunt   ?     dx >50  ? Prostate cancer Paternal Uncle   ?     dx. early-mid 70s, not metastatic  ? ? ?Social History  ? ?Socioeconomic  History  ? Marital status: Single  ?  Spouse name: Not on file  ? Number of children: Not on file  ? Years of education: Not on file  ? Highest education level: Not on file  ?Occupational History  ? Not on file  ?Tobacco Use  ? Smoking status: Never  ? Smokeless tobacco: Never  ?Vaping Use  ? Vaping Use: Never used  ?Substance and Sexual Activity  ? Alcohol use: Not Currently  ? Drug use: Never  ? Sexual activity: Not on file  ?Other Topics Concern  ? Not on file  ?Social History Narrative  ? Not on file  ? ?Social Determinants of Health  ? ?Financial Resource Strain: Not on file  ?Food Insecurity: Not on file  ?Transportation Needs: Not on file  ?Physical Activity: Not on file  ?Stress: Not on file  ?Social Connections: Not on file  ?Intimate Partner Violence: Not on file  ? ? ?Review of Systems: Review of Systems  ?Constitutional:  Negative for chills and fever.  ?HENT:  Negative for hearing loss and tinnitus.   ?Eyes:  Negative for blurred vision and double vision.  ?Respiratory:  Negative for cough and hemoptysis.   ?Cardiovascular:  Negative for chest pain and palpitations.  ?Gastrointestinal:  Positive for blood in stool. Negative for abdominal pain, constipation, diarrhea, heartburn, melena, nausea and vomiting.  ?Genitourinary:  Negative for dysuria and urgency.  ?Musculoskeletal:  Negative for myalgias and neck pain.  ?Skin:  Negative for itching and rash.  ?Neurological:  Negative for dizziness and headaches.  ?Endo/Heme/Allergies:  Negative for environmental allergies. Does not bruise/bleed easily.  ?Psychiatric/Behavioral:  Negative for depression and substance abuse.   ? ? ?Physical Exam:Physical Exam ?Constitutional:   ?   Appearance: Normal appearance. She is obese.  ?HENT:  ?   Head: Normocephalic and atraumatic.  ?   Right Ear: External ear normal.  ?   Left Ear: External ear normal.  ?   Nose: Nose normal.  ?   Mouth/Throat:  ?   Mouth: Mucous membranes are moist.  ?Eyes:  ?   General: No scleral  icterus. ?   Pupils: Pupils are equal, round, and reactive to light.  ?Cardiovascular:  ?   Rate and Rhythm: Normal rate and regular rhythm.  ?   Pulses: Normal pulses.  ?   Heart sounds: Normal heart sounds.  ?Pulmonary:  ?   Effort: Pulmonary effort is normal.  ?   Breath sounds: Normal breath sounds.  ?Abdominal:  ?   General: Abdomen is flat. Bowel sounds are normal. There is no distension.  ?   Palpations: Abdomen is soft. There is no mass.  ?   Tenderness: There is no abdominal tenderness. There is no guarding or rebound.  ?   Hernia: No hernia is present.  ?Genitourinary: ?   Rectum: Guaiac result positive.  ?   Comments: Multiple external hemorrhoids, thrombosed,  gross hematochezia on rectal exam.  ?Musculoskeletal:     ?   General: Normal range of motion.  ?   Cervical back: Normal range of motion and neck supple.  ?Skin: ?   General: Skin is warm and dry.  ?Neurological:  ?   General: No focal deficit present.  ?   Mental Status: She is alert and oriented to person, place, and time. Mental status is at baseline.  ?Psychiatric:     ?   Mood and Affect: Mood normal.     ?   Behavior: Behavior normal.  ? ? ?Vital signs: ?Vitals:  ? 08/27/21 0445 08/27/21 0600  ?BP: 135/71 138/79  ?Pulse: 67 78  ?Resp: 18 20  ?Temp:  97.6 ?F (36.4 ?C)  ?SpO2: 96% 99%  ? ?  ? ? ? ?GI:  ?Lab Results: ?Recent Labs  ?  08/27/21 ?0313  ?WBC 5.5  ?HGB 11.7*  ?HCT 36.8  ?PLT 180  ? ?BMET ?Recent Labs  ?  08/27/21 ?0313  ?NA 141  ?K 3.7  ?CL 110  ?CO2 24  ?GLUCOSE 118*  ?BUN 19  ?CREATININE 1.00  ?CALCIUM 9.0  ? ?LFT ?Recent Labs  ?  08/27/21 ?0313  ?PROT 7.3  ?ALBUMIN 3.9  ?AST 24  ?ALT 28  ?ALKPHOS 55  ?BILITOT 0.2*  ? ?PT/INR ?No results for input(s): LABPROT, INR in the last 72 hours. ? ? ?Studies/Results: ?No results found. ? ?Impression: ?Hemorrhoids  ?Hematochezia ? ?Hematochezia seems to be lower GI in source. Possible hemorrhoidal vs. Diverticular bleeding. Cannot rule out malignancy for cause of possible bleeding. Gross  hematochezia on rectal exam.  ?Thrombosed hemorrhoids.  ? ?HGB 11.2 Platelets 180 ? ?Hgb stable baseline seems around 11.7.  ? ?Plan: ?Will continue to monitor Hgb and transfuse to maintain Hgb >7.0  ?Cont

## 2021-08-27 NOTE — Progress Notes (Signed)
?  Carryover admission to the Day Admitter.  I discussed this case with the EDP, Dr. Ralene Bathe.  Per these discussions: ? ? ?This is a 66 year old female who is chronically anticoagulated on Xarelto in the setting of a history of intracardiac thrombus, following with [facility], who is being admitted for acute lower gastrointestinal bleed after presenting with 2 days of intermittent hematochezia. ? ?Aside from the aforementioned hematochezia, patient is otherwise reportedly completely asymptomatic, including no chest pain, shortness of breath, dizziness, or abdominal pain. Patient reportedly hemodynamically stable, without any evidence of hypotension or tachycardia.  Additionally, hemoglobin reportedly stable at 11, which she is reportedly unchanged from most recent prior value.  Most recent dose of Xarelto currently on 523.  No known history of chronic liver disease.  Type and screen ordered in the ED.  ? ?I have placed an order for overnight observation for evaluation and management of suspected acute lower gastrointestinal bleed. ? ?I have placed some additional preliminary admit orders via the adult multi-morbid admission order set. I have also ordered repeat H&H check to occur at 8 AM this morning.  Case has not yet been discussed with gastroenterology. ? ? ? ?Babs Bertin, DO ?Hospitalist  ?

## 2021-08-27 NOTE — ED Notes (Signed)
Report given by previous RN, transport RN at bedside. Connected pt to transport monitor.  ?

## 2021-08-27 NOTE — ED Triage Notes (Signed)
Pt reports spotting from her rectum 2 days ago and states that earlier yesterday she started having bright red bleeding. Pt reports a hx of hemorrhoids.  ?

## 2021-08-27 NOTE — ED Provider Notes (Signed)
?[facility] DEPT ?Provider Note ? ? ?CSN: [id number] ?Arrival date & time: 08/27/21  0103 ? ?  ? ?History ? ?Chief Complaint  ?Patient presents with  ? Rectal Bleeding  ? ? ?Natalina Dennice Tindol is a 66 y.o. female. ? ?The history is provided by the patient and medical records.  ?Rectal Bleeding ?Katlyne Raha Tennison is a 66 y.o. female who presents to the Emergency Department complaining of rectal bleeding.  She presents to the emergency department for evaluation of hematochezia that started a few days ago.  She reports history of hemorrhoids and does have some spotting with BMs.  Over the last few days she has experienced occasional spotting.  Shortly prior to ED arrival she had some rectal discomfort.  She went to urinate and when she pushed she had a large amount of bright red blood per rectum.  The water turned red.  She had a second episode when she went to urinate where she passed a clot and her discomfort was decreased. 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  Has a history of breast cancer in remission.  No reports of associated abdominal pain, chest pain, shortness of breath, lightheadedness.  She does not have gastroenterologist. ? ?  ? ?Home Medications ?Prior to Admission medications   ?Medication Sig Start Date End Date Taking? Authorizing Provider  ?Cholecalciferol (VITAMIN D) 50 MCG (2000 UT) tablet Take 2,000 Units by mouth daily.    [provider]  ?Cyanocobalamin (B-12-SL) 1000 MCG SUBL Place 1,000 mcg under the tongue daily.    [provider]  ?furosemide (LASIX) 20 MG tablet Take 1 tablet (20 mg total) by mouth daily. 04/27/21   Nicholas Lose, MD  ?lidocaine-prilocaine (EMLA) cream Apply to affected area once 06/19/19   Nicholas Lose, MD  ?potassium chloride (KLOR-CON) 10 MEQ tablet Take 1 tablet (10 mEq total) by mouth daily. 04/27/21   Nicholas Lose, MD  ?pyridOXINE (VITAMIN B-6) 100 MG tablet Take 100 mg by mouth 3 (three) times a week.     [provider]  ?rivaroxaban (XARELTO) 20 MG TABS tablet Take 1 tablet (20 mg total) by mouth daily with supper. Appointment needed with cardiologist for further refills 02/23/21   Pixie Casino, MD  ?TURMERIC CURCUMIN PO Take 1 tablet by mouth 3 (three) times a week.    [provider]  ?   ? ?Allergies    ?Latex   ? ?Review of Systems   ?Review of Systems  ?Gastrointestinal:  Positive for hematochezia.  ?All other systems reviewed and are negative. ? ?Physical Exam ?Updated Vital Signs ?BP 138/79 (BP Location: Left Arm)   Pulse 78   Temp 97.6 ?F (36.4 ?C) (Oral)   Resp 20   Ht '5\' 6"'$  (1.676 m)   Wt 114.8 kg   SpO2 99%   BMI 40.84 kg/m?  ?Physical Exam ?Vitals and nursing note reviewed.  ?Constitutional:   ?   Appearance: She is well-developed.  ?HENT:  ?   Head: Normocephalic and atraumatic.  ?Cardiovascular:  ?   Rate and Rhythm: Normal rate and regular rhythm.  ?Pulmonary:  ?   Effort: Pulmonary effort is normal. No respiratory distress.  ?Abdominal:  ?   Palpations: Abdomen is soft.  ?   Tenderness: There is no abdominal tenderness. There is no guarding or rebound.  ?Genitourinary: ?   Comments: Multiple soft external hemorrhoids on examination.  There is gross red blood on DRE.  Small clot present. ?Musculoskeletal:     ?  General: No tenderness.  ?Skin: ?   General: Skin is warm and dry.  ?Neurological:  ?   Mental Status: She is alert and oriented to person, place, and time.  ?Psychiatric:     ?   Behavior: Behavior normal.  ? ? ?ED Results / Procedures / Treatments   ?Labs ?(all labs ordered are listed, but only abnormal results are displayed) ?Labs Reviewed  ?COMPREHENSIVE METABOLIC PANEL - Abnormal; Notable for the following components:  ?    Result Value  ? Glucose, Bld 118 (*)   ? Total Bilirubin 0.2 (*)   ? All other components within normal limits  ?CBC - Abnormal; Notable for the following components:  ? Hemoglobin 11.7 (*)   ? MCV 75.1 (*)   ? MCH 23.9 (*)   ? RDW 16.7  (*)   ? All other components within normal limits  ?POC OCCULT BLOOD, ED  ?TYPE AND SCREEN  ? ? ?EKG ?None ? ?Radiology ?No results found. ? ?Procedures ?Procedures  ? ? ?Medications Ordered in ED ?Medications - No data to display ? ?ED Course/ Medical Decision Making/ A&P ?  ?                        ?Medical Decision Making ?Amount and/or Complexity of Data Reviewed ?Labs: ordered. ? ?Risk ?Decision regarding hospitalization. ? ? ?Patient has a relative for history of cardiac thrombus here for evaluation of hematochezia.  She does have external hemorrhoids on examination.  She also has significant blood in the rectal vault.  Hemoglobin is near her baseline.  Unclear if this is a hemorrhoidal bleed versus diverticular bleed.  She did have recurrent episode of hematochezia during her ED stay.  Given her anticoagulation recommend observation.  Medicine consulted for observation admission. ? ? ? ? ? ? ? ?Final Clinical Impression(s) / ED Diagnoses ?Final diagnoses:  ?Hematochezia  ? ? ?Rx / DC Orders ?ED Discharge Orders   ? ? None  ? ?  ? ? ?  ?Quintella Reichert, MD ?08/27/21 3976 ? ?

## 2021-08-28 DIAGNOSIS — C50211 Malignant neoplasm of upper-inner quadrant of right female breast: Secondary | ICD-10-CM

## 2021-08-28 DIAGNOSIS — I5189 Other ill-defined heart diseases: Secondary | ICD-10-CM

## 2021-08-28 DIAGNOSIS — K921 Melena: Secondary | ICD-10-CM

## 2021-08-28 DIAGNOSIS — Z171 Estrogen receptor negative status [ER-]: Secondary | ICD-10-CM

## 2021-08-28 DIAGNOSIS — K922 Gastrointestinal hemorrhage, unspecified: Secondary | ICD-10-CM | POA: Diagnosis not present

## 2021-08-28 LAB — CBC
HCT: 35.4 % — ABNORMAL LOW (ref 36.0–46.0)
Hemoglobin: 11.3 g/dL — ABNORMAL LOW (ref 12.0–15.0)
MCH: 24.1 pg — ABNORMAL LOW (ref 26.0–34.0)
MCHC: 31.9 g/dL (ref 30.0–36.0)
MCV: 75.6 fL — ABNORMAL LOW (ref 80.0–100.0)
Platelets: 174 10*3/uL (ref 150–400)
RBC: 4.68 MIL/uL (ref 3.87–5.11)
RDW: 17 % — ABNORMAL HIGH (ref 11.5–15.5)
WBC: 3.9 10*3/uL — ABNORMAL LOW (ref 4.0–10.5)
nRBC: 0 % (ref 0.0–0.2)

## 2021-08-28 LAB — BASIC METABOLIC PANEL
Anion gap: 6 (ref 5–15)
BUN: 9 mg/dL (ref 8–23)
CO2: 27 mmol/L (ref 22–32)
Calcium: 8.8 mg/dL — ABNORMAL LOW (ref 8.9–10.3)
Chloride: 107 mmol/L (ref 98–111)
Creatinine, Ser: 0.93 mg/dL (ref 0.44–1.00)
GFR, Estimated: >60 mL/min (ref >60)
Glucose, Bld: 117 mg/dL — ABNORMAL HIGH (ref 70–99)
Potassium: 3.8 mmol/L (ref 3.5–5.1)
Sodium: 140 mmol/L (ref 135–145)

## 2021-08-28 MED ORDER — PRAMOXINE HCL (PERIANAL) 1 % EX FOAM
1.0000 | Freq: Three times a day (TID) | CUTANEOUS | 1 refills | Status: DC | PRN
Start: 2021-08-28 — End: 2021-08-28

## 2021-08-28 MED ORDER — HYDROCORT-PRAMOXINE (PERIANAL) 1-1 % EX FOAM: 1.0000 | Freq: Three times a day (TID) | CUTANEOUS | Status: DC

## 2021-08-28 MED ORDER — PRAMOXINE HCL (PERIANAL) 1 % EX FOAM
1.0000 | Freq: Three times a day (TID) | CUTANEOUS | 0 refills | Status: DC | PRN
Start: 2021-08-28 — End: 2023-01-09

## 2021-08-28 MED ORDER — HYDROCORT-PRAMOXINE (PERIANAL) 1-1 % EX FOAM
1.0000 | Freq: Three times a day (TID) | CUTANEOUS | Status: DC
  Administered 2021-08-28: 1 via RECTAL
  Filled 2021-08-28: qty 10

## 2021-08-28 NOTE — Progress Notes (Signed)
[facility] Progress Note ? ?Kathleen Potter 66 y.o. 1956-03-03 ? ? ?Subjective: ?No bleeding this morning. Specks of blood and small smear in her underwear. Denies abdominal pain. Nurse Aggie Hacker) in room and chaperoned rectal exam. ? ?Objective: ?Vital signs: ?Vitals:  ? 08/28/21 0043 08/28/21 0523  ?BP: 133/68 133/76  ?Pulse: 71 60  ?Resp: 19 18  ?Temp: 98.4 ?F (36.9 ?C) 98.4 ?F (36.9 ?C)  ?SpO2: 97% 100%  ? ? ?Physical Exam: ?Gen: alert, no acute distress, obese, pleasant ?HEENT: anicteric sclera ?CV: RRR ?Chest: CTA B ?Abd: soft, nontender, nondistended, +BS ?Rectal: Large non-thrombosed external hemorrhoids with one of the hemorrhoids (8 o'clock position) soft but tender, Tender DRE, no thrombosed internal hemorrhoids appreciated ?Ext: no edema ? ?Lab Results: ?Recent Labs  ?  08/27/21 ?0313 08/28/21 ?9371  ?NA 141 140  ?K 3.7 3.8  ?CL 110 107  ?CO2 24 27  ?GLUCOSE 118* 117*  ?BUN 19 9  ?CREATININE 1.00 0.93  ?CALCIUM 9.0 8.8*  ? ?Recent Labs  ?  08/27/21 ?0313  ?AST 24  ?ALT 28  ?ALKPHOS 55  ?BILITOT 0.2*  ?PROT 7.3  ?ALBUMIN 3.9  ? ?Recent Labs  ?  08/27/21 ?0313 08/27/21 ?0814 08/28/21 ?0758  ?WBC 5.5  --  3.9*  ?HGB 11.7* 11.2* 11.3*  ?HCT 36.8 35.0* 35.4*  ?MCV 75.1*  --  75.6*  ?PLT 180  --  174  ? ? ? ? ?Assessment/Plan: ?Hematochezia - likely rectal outlet source from a recently thrombosed external hemorrhoid more so than a diverticular source. Topical therapy with Pramoxine cream. Avoid constipation with laxatives and stool softeners prn. Hold Xarelto for another 3 days if ok with cards/TRH. Heart healthy diet. Ok to go home today from GI standpoint. Our office will arrange GI f/u for May to set up outpt colonoscopy that she is overdue for. D/W Dr. Darrick Meigs. ? ? ?[name] ?08/28/2021, 11:55 AM ? ?Questions please call [phone] Patient ID: Kathleen Potter, female   DOB: [birth year], 66 y.o.   MRN: [id number] ? ?

## 2021-08-28 NOTE — Progress Notes (Signed)
Patient has been taught and explained discharge instructions. Patient has no further questions at this time. IV has been removed and site is clean, dry and intact. ? ?Layla Maw, RN ?

## 2021-08-28 NOTE — Discharge Summary (Addendum)
?Physician Discharge Summary ?  ?Patient: Kathleen Potter MRN: [id number] DOB: 12-15-1955  ?Admit date:     08/27/2021  ?Discharge date: 08/28/21  ?Discharge Physician: Oswald Hillock  ? ?PCP: [facility]  ? ?Recommendations at discharge:  ? ?Follow-up gastroenterology as outpatient ? ?Discharge Diagnoses: ?Principal Problem: ?  Acute lower GI bleeding ?Active Problems: ?  Malignant neoplasm of upper-inner quadrant of right breast in female, estrogen receptor negative ([place]) ?  Obesity, Class III, BMI 40-49.9 (morbid obesity) ([facility]) ?  Acute blood loss anemia ?  Right atrial mass ?  Bilateral leg edema ? ?Resolved Problems: ?  * No resolved hospital problems. * ? ?Hospital Course: ?66 y.o. female with medical history significant of atrial mass, hypertension, bilateral lower extremity edema, class III obesity, right breast cancer who is coming to the emergency department with complaints of having multiple episodes of hematochezia since yesterday.  She has had hemorrhoidal bleeds in the past but never as pronounced as this episode.  ? ?Assessment and Plan: ? ?Hematochezia ?-Resolved ?-Patient has history of hemorrhoids and diverticulosis ?-Appreciate GI input ?-Bleeding appears more likely from thrombosed external hemorrhoid ?-Recommend Proctofoam 1% 3 times daily ?-We will restart Xarelto as patient has atrial thrombus ?-Follow-up GI as outpatient ? ?Malignant neoplasm of upper-inner quadrant of right breast in female ?-Follow-up Dr. Nyoka Cowden as outpatient ? ?Right atrial mass ?-Xarelto was held for 1 day ?-We will restart Xarelto as patient has right atrial thrombus ? ?Bilateral leg edema ?-Continue ferrous by 20 mg p.o. twice daily ? ? ? ?  ? ? ?Consultants: Gastroenterology ?Procedures performed:  ?Disposition: Home ?Diet recommendation:  ?Discharge Diet Orders (From admission, onward)  ? ?  Start     Ordered  ? 08/28/21 0000  Diet - low sodium heart healthy       ? 08/28/21 1320  ? ?  ?   ? ?  ? ?Regular diet ?DISCHARGE MEDICATION: ?Allergies as of 08/28/2021   ? ?   Reactions  ? Latex Itching  ? ?  ? ?  ?Medication List  ?  ? ?TAKE these medications   ? ?B-12-SL 1000 MCG Subl ?Generic drug: Cyanocobalamin ?Place 1,000 mcg under the tongue daily. ?  ?furosemide 20 MG tablet ?Commonly known as: LASIX ?Take 1 tablet (20 mg total) by mouth daily. ?  ?hydrocortisone-pramoxine rectal foam ?Commonly known as: PROCTOFOAM-HC ?Place 1 applicator rectally 3 (three) times daily. Apply  as needed ?  ?lidocaine-prilocaine cream ?Commonly known as: EMLA ?Apply to affected area once ?  ?potassium chloride 10 MEQ tablet ?Commonly known as: KLOR-CON ?Take 1 tablet (10 mEq total) by mouth daily. ?  ?pramoxine 1 % foam ?Commonly known as: PROCTOFOAM ?Place 1 application. rectally 3 (three) times daily as needed for anal itching. ?  ?pyridOXINE 100 MG tablet ?Commonly known as: VITAMIN B-6 ?Take 100 mg by mouth 3 (three) times a week. ?  ?rivaroxaban 20 MG Tabs tablet ?Commonly known as: Xarelto ?Take 1 tablet (20 mg total) by mouth daily with supper. Appointment needed with cardiologist for further refills ?  ?TURMERIC CURCUMIN PO ?Take 1 tablet by mouth 3 (three) times a week. ?  ?Vitamin D 50 MCG (2000 UT) tablet ?Take 2,000 Units by mouth 3 (three) times a week. ?  ? ?  ? ? Follow-up Information   ? ? Wilford Corner, MD. Schedule an appointment as soon as possible for a visit.   ?Specialty: Gastroenterology ?Contact information: ?[address]  [place] [address] ?[phone] ? ? ?  ?  ? ?  ?  ? ?  ? ?Discharge Exam: ?Danley Danker Weights  ? 08/27/21 0117  ?Weight: 114.8 kg  ? ?General-appears in no acute distress ?Heart-S1-S2, regular, no murmur auscultated ?Lungs-clear to auscultation bilaterally, no wheezing or crackles auscultated ?Abdomen-soft, nontender, no organomegaly ?Extremities-no edema in the lower extremities ?Neuro-alert, oriented x3, no focal deficit noted ? ?Condition at discharge:  good ? ?The results of significant diagnostics from this hospitalization (including imaging, microbiology, ancillary and laboratory) are listed below for reference.  ? ?Imaging Studies: ?No results found. ? ?Microbiology: ?Results for orders placed or performed during the hospital encounter of 10/13/19  ?SARS CORONAVIRUS 2 (TAT 6-24 HRS) Nasopharyngeal Nasopharyngeal Swab     Status: None  ? Collection Time: 10/13/19 11:19 AM  ? Specimen: Nasopharyngeal Swab  ?Result Value Ref Range Status  ? SARS Coronavirus 2 NEGATIVE NEGATIVE Final  ?  Comment: (NOTE) ?SARS-CoV-2 target nucleic acids are NOT DETECTED. ?The SARS-CoV-2 RNA is generally detectable in upper and lower ?respiratory specimens during the acute phase of infection. Negative ?results do not preclude SARS-CoV-2 infection, do not rule out ?co-infections with other pathogens, and should not be used as the ?sole basis for treatment or other patient management decisions. ?Negative results must be combined with clinical observations, ?patient history, and epidemiological information. The expected ?result is Negative. ?Fact Sheet for Patients: ?SugarRoll.be ?Fact Sheet for Healthcare Providers: ?https://www.woods-mathews.com/ ?This test is not yet approved or cleared by the [place] FDA and  ?has been authorized for detection and/or diagnosis of SARS-CoV-2 by ?FDA under an Emergency Use Authorization (EUA). This EUA will remain  ?in effect (meaning this test can be used) for the duration of the ?COVID-19 declaration under Section 56 4(b)(1) of the Act, 21 U.S.C. ?section 360bbb-3(b)(1), unless the authorization is terminated or ?revoked sooner. ?Performed at [facility], [place] [address], [place], [place] ?[address] ?  ? ? ?Labs: ?CBC: ?Recent Labs  ?Lab 08/27/21 ?0313 08/27/21 ?3151 08/28/21 ?0758  ?WBC 5.5  --  3.9*  ?HGB 11.7* 11.2* 11.3*  ?HCT 36.8 35.0* 35.4*  ?MCV 75.1*  --  75.6*  ?PLT 180  --  174  ? ?Basic  Metabolic Panel: ?Recent Labs  ?Lab 08/27/21 ?0313 08/28/21 ?0758  ?NA 141 140  ?K 3.7 3.8  ?CL 110 107  ?CO2 24 27  ?GLUCOSE 118* 117*  ?BUN 19 9  ?CREATININE 1.00 0.93  ?CALCIUM 9.0 8.8*  ? ?Liver Function Tests: ?Recent Labs  ?Lab 08/27/21 ?0313  ?AST 24  ?ALT 28  ?ALKPHOS 55  ?BILITOT 0.2*  ?PROT 7.3  ?ALBUMIN 3.9  ? ?CBG: ?No results for input(s): GLUCAP in the last 168 hours. ? ?Discharge time spent: greater than 30 minutes. ? ?Signed: ?Oswald Hillock, MD ?[facility] ?08/28/2021 ?

## 2021-08-30 ENCOUNTER — Encounter: Payer: Self-pay | Admitting: Hematology and Oncology

## 2021-10-27 ENCOUNTER — Encounter: Payer: Self-pay | Admitting: *Deleted

## 2021-10-27 NOTE — Progress Notes (Signed)
Per MD request, RN sent inbasket to Dr. Brantley Stage and his RN requesting pt being scheduled for port removal.

## 2021-10-29 ENCOUNTER — Encounter: Payer: Self-pay | Admitting: *Deleted

## 2021-10-29 NOTE — Progress Notes (Signed)
Received inbasket from Dr. Josetta Huddle nurse stating their office has attempted to reach the nurse over the last 6 months to schedule port removal but will continue to reach out to pt.  RN attempt x1 to contact pt, no answer.  Unable to LVM due to VM being full.

## 2021-12-14 ENCOUNTER — Other Ambulatory Visit: Payer: Self-pay

## 2022-01-04 ENCOUNTER — Encounter: Payer: Self-pay | Admitting: Hematology and Oncology

## 2022-01-11 ENCOUNTER — Encounter: Payer: Self-pay | Admitting: Hematology and Oncology

## 2022-01-11 ENCOUNTER — Ambulatory Visit
Admission: RE | Admit: 2022-01-11 | Discharge: 2022-01-11 | Disposition: A | Discharge status: Home / Self Care | Payer: Medicare Other | Source: Ambulatory Visit | Attending: Hematology and Oncology | Admitting: Hematology and Oncology

## 2022-01-11 DIAGNOSIS — Z171 Estrogen receptor negative status [ER-]: Secondary | ICD-10-CM

## 2022-03-30 ENCOUNTER — Other Ambulatory Visit: Payer: Self-pay | Admitting: Family Medicine

## 2022-03-30 DIAGNOSIS — Z780 Asymptomatic menopausal state: Secondary | ICD-10-CM

## 2022-04-20 ENCOUNTER — Encounter: Payer: Self-pay | Admitting: Hematology and Oncology

## 2022-04-24 NOTE — Progress Notes (Signed)
Patient Care Team: Rankins, Fanny Dance, MD as PCP - General (Family Medicine) Harriette Bouillon, MD as Consulting Physician (General Surgery) Serena Croissant, MD as Consulting Physician (Hematology and Oncology) Dorothy Puffer, MD as Consulting Physician (Radiation Oncology)  DIAGNOSIS:  Encounter Diagnosis  Name Primary?   Malignant neoplasm of upper-inner quadrant of right breast in female, estrogen receptor negative (HCC) Yes    SUMMARY OF ONCOLOGIC HISTORY: Oncology History  Malignant neoplasm of upper-inner quadrant of right breast in female, estrogen receptor negative (HCC)  01/02/2019 Initial Diagnosis   Routine screening mammogram detected a 1.2cm mass in the right breast at the 12:30 location 7 cm from the nipple, no axillary adenopathy. Biopsy showed IDC, grade 2-3, HER-2 - (1+), ER -, PR -, Ki67 80%.    01/03/2019 Cancer Staging   Staging form: Breast, AJCC 8th Edition - Clinical stage from 01/03/2019: Stage IB (cT1c, cN0, cM0, G3, ER-, PR-, HER2-)    03/01/2019 Surgery   Right breast lumpectomy ([facility]) ([id number]): IDC, grade 3, 1.4cm, clear margins, 3 lymph nodes negative for carcinoma.    03/09/2019 Cancer Staging   Staging form: Breast, AJCC 8th Edition - Pathologic stage from 03/09/2019: Stage IB (pT1c, pN0, cM0, G3, ER-, PR-, HER2-)    03/27/2019 - 07/17/2019 Chemotherapy   DOXOrubicin (ADRIAMYCIN) chemo injection 118 mg, 50 mg/m2 = 118 mg (83.3 % of original dose 60 mg/m2), Intravenous,  Once, 4 of 4 cycles. Dose modification: 50 mg/m2 (original dose 60 mg/m2, Cycle 1, Reason: Provider Judgment), 45 mg/m2 (original dose 60 mg/m2, Cycle 3, Reason: Provider Judgment) Administration: 118 mg (03/27/2019), 118 mg (04/10/2019), 106 mg (04/24/2019), 106 mg (05/08/2019)  palonosetron (ALOXI) injection 0.25 mg, 0.25 mg, Intravenous,  Once, 12 of 15 cycles Administration: 0.25 mg (03/27/2019), 0.25 mg (04/10/2019), 0.25 mg (04/24/2019), 0.25 mg (05/08/2019), 0.25 mg (05/29/2019),  0.25 mg (06/06/2019), 0.25 mg (06/12/2019), 0.25 mg (06/19/2019), 0.25 mg (06/26/2019), 0.25 mg (07/03/2019), 0.25 mg (07/10/2019), 0.25 mg (07/17/2019)  pegfilgrastim-jmdb (FULPHILA) injection 6 mg, 6 mg, Subcutaneous,  Once, 4 of 4 cycles. Administration: 6 mg (03/29/2019), 6 mg (04/12/2019), 6 mg (04/26/2019), 6 mg (05/10/2019)  cyclophosphamide (CYTOXAN) 1,180 mg in sodium chloride 0.9 % 250 mL chemo infusion, 500 mg/m2 = 1,180 mg (83.3 % of original dose 600 mg/m2), Intravenous,  Once, 4 of 4 cycles Dose modification: 500 mg/m2 (original dose 600 mg/m2, Cycle 1, Reason: Provider Judgment), 450 mg/m2 (original dose 600 mg/m2, Cycle 3, Reason: Provider Judgment) Administration: 1,180 mg (03/27/2019), 1,180 mg (04/10/2019), 1,060 mg (04/24/2019), 1,060 mg (05/08/2019)  PACLitaxel (TAXOL) 192 mg in sodium chloride 0.9 % 250 mL chemo infusion (</= 80mg /m2), 80 mg/m2 = 192 mg, Intravenous,  Once, 9 of 12 cycles Dose modification: 65 mg/m2 (original dose 80 mg/m2, Cycle 12, Reason: Dose not tolerated) Administration: 192 mg (05/22/2019), 192 mg (05/29/2019), 192 mg (06/06/2019), 192 mg (06/12/2019), 192 mg (06/19/2019), 192 mg (06/26/2019), 192 mg (07/03/2019), 156 mg (07/10/2019), 156 mg (07/17/2019)  fosaprepitant (EMEND) 150 mg, dexamethasone (DECADRON) 12 mg in sodium chloride 0.9 % 145 mL IVPB, , Intravenous,  Once, 4 of 4 cycles Administration:  (03/27/2019),  (04/10/2019),  (04/24/2019),  (05/08/2019)   08/15/2019 - 09/11/2019 Radiation Therapy   The patient initially received a dose of 42.56 Gy in 16 fractions to the breast using whole-breast tangent fields. This was delivered using a 3-D conformal technique. The patient then received a boost to the seroma. This delivered an additional 10 Gy in 39fractions using an en face electron field due to the depth  of the seroma. The total dose was 52.56Gy.     CHIEF COMPLIANT: Surveillance of triple negative right breast cancer   INTERVAL HISTORY: Kathleen Potter is a 66  y.o. with above-mentioned history of triple negative right breast cancer who underwent a lumpectomy, adjuvant chemotherapy, radiation, and is currently on surviellance. She presents to the clinic today for follow-up. She reports that she is doing fine. She still is having neuropathy in feet, a little worst in legs. She says she mostly notice it at night. She states fingers are numb, mostly in the right hand. She does walk for exercise. Her main concern is losing weight. She does have some discomfort in the breast she believes it is coming from the port.   ALLERGIES:  is allergic to latex.  MEDICATIONS:  Current Outpatient Medications  Medication Sig Dispense Refill   Cholecalciferol (VITAMIN D) 50 MCG (2000 UT) tablet Take 2,000 Units by mouth 3 (three) times a week.     Cyanocobalamin (B-12-SL) 1000 MCG SUBL Place 1,000 mcg under the tongue daily.     furosemide (LASIX) 20 MG tablet Take 1 tablet (20 mg total) by mouth daily. 90 tablet 3   hydrocortisone-pramoxine (PROCTOFOAM-HC) rectal foam Place 1 applicator rectally 3 (three) times daily. Apply  as needed     lidocaine-prilocaine (EMLA) cream Apply to affected area once 30 g 3   potassium chloride (KLOR-CON) 10 MEQ tablet Take 1 tablet (10 mEq total) by mouth daily. 90 tablet 3   pramoxine (PROCTOFOAM) 1 % foam Place 1 application. rectally 3 (three) times daily as needed for anal itching. 15 g 0   pyridOXINE (VITAMIN B-6) 100 MG tablet Take 100 mg by mouth 3 (three) times a week.     rivaroxaban (XARELTO) 20 MG TABS tablet Take 1 tablet (20 mg total) by mouth daily with supper. Appointment needed with cardiologist for further refills 30 tablet 0   TURMERIC CURCUMIN PO Take 1 tablet by mouth 3 (three) times a week.     No current facility-administered medications for this visit.    PHYSICAL EXAMINATION: ECOG PERFORMANCE STATUS: 1 - Symptomatic but completely ambulatory  Vitals:   04/27/22 1345  BP: (!) 130/91  Pulse: 91  Resp: 18   Temp: (!) 97.3 F (36.3 C)  SpO2: 100%   Filed Weights   04/27/22 1345  Weight: 271 lb 1.6 oz (123 kg)    BREAST: No palpable masses or nodules in either right or left breasts. No palpable axillary supraclavicular or infraclavicular adenopathy no breast tenderness or nipple discharge. (exam performed in the presence of a chaperone)  LABORATORY DATA:  I have reviewed the data as listed    Latest Ref Rng & Units 08/28/2021    7:58 AM 08/27/2021    3:13 AM 04/28/2020    1:14 PM  CMP  Glucose 70 - 99 mg/dL 161  096  85   BUN 8 - 23 mg/dL 9  19  14    Creatinine 0.44 - 1.00 mg/dL 0.45  4.09  8.11   Sodium 135 - 145 mmol/L 140  141  141   Potassium 3.5 - 5.1 mmol/L 3.8  3.7  3.8   Chloride 98 - 111 mmol/L 107  110  109   CO2 22 - 32 mmol/L 27  24  26    Calcium 8.9 - 10.3 mg/dL 8.8  9.0  9.1   Total Protein 6.5 - 8.1 g/dL  7.3  6.9   Total Bilirubin 0.3 - 1.2 mg/dL  0.2  0.4   Alkaline Phos 38 - 126 U/L  55  75   AST 15 - 41 U/L  24  23   ALT 0 - 44 U/L  28  25     Lab Results  Component Value Date   WBC 3.9 (L) 08/28/2021   HGB 11.3 (L) 08/28/2021   HCT 35.4 (L) 08/28/2021   MCV 75.6 (L) 08/28/2021   PLT 174 08/28/2021   NEUTROABS 2.6 04/28/2020    ASSESSMENT & PLAN:  Malignant neoplasm of upper-inner quadrant of right breast in female, estrogen receptor negative (HCC) 01/02/2019:Routine screening mammogram detected a 1.2cm mass in the right breast at the 12:30 location 7 cm from the nipple, no axillary adenopathy. Biopsy showed IDC, grade 2-3, HER-2 - (1+), ER -, PR -, Ki67 80%.    03/01/2019: Right lumpectomy: Grade 3 IDC, 1.4 cm, margins clear, 0/3 lymph nodes negative, triple negative stage IIb   Treatment plan: 1. Adjuvant chemotherapy with dose dense Adriamycin and Cytoxan x4 followed by Taxol weekly x9 (completed February 2021) 2. 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  Currently on Eliquis because of the blood clot at the tip of the port. 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  Could be thrombus or tumor. TEE August 2021 at [facility]: Presence of the right atrial mass is still detected but the did not measure it. Patient is being followed by cardiology for this.   I sent a message to Dr. Luisa Hart to remove her port.  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  I encouraged her to watch her weight and exercise regularly.  Return to clinic in 1 year for follow-up    No orders of the defined types were placed in this encounter.  The patient has a good understanding of the overall plan. she agrees with it. she will call with any problems that may develop before the next visit here. Total time spent: 30 mins including face to face time and time spent for planning, charting and co-ordination of care   Tamsen Meek, MD 04/27/22    I Janan Ridge am scribing for Dr. Pamelia Hoit  I have reviewed the above documentation for accuracy and completeness, and I agree with the above.

## 2022-04-27 ENCOUNTER — Other Ambulatory Visit: Payer: Self-pay

## 2022-04-27 ENCOUNTER — Inpatient Hospital Stay: Payer: Medicare Other | Attending: Hematology and Oncology | Admitting: Hematology and Oncology

## 2022-04-27 VITALS — BP 130/91 | HR 91 | Temp 97.3°F | Resp 18 | Ht 66.0 in | Wt 271.1 lb

## 2022-04-27 DIAGNOSIS — Z171 Estrogen receptor negative status [ER-]: Secondary | ICD-10-CM | POA: Diagnosis not present

## 2022-04-27 DIAGNOSIS — G620 Drug-induced polyneuropathy: Secondary | ICD-10-CM | POA: Diagnosis not present

## 2022-04-27 DIAGNOSIS — Z7901 Long term (current) use of anticoagulants: Secondary | ICD-10-CM | POA: Insufficient documentation

## 2022-04-27 DIAGNOSIS — C50211 Malignant neoplasm of upper-inner quadrant of right female breast: Secondary | ICD-10-CM | POA: Insufficient documentation

## 2022-04-27 DIAGNOSIS — Z79899 Other long term (current) drug therapy: Secondary | ICD-10-CM | POA: Diagnosis not present

## 2022-04-27 DIAGNOSIS — R609 Edema, unspecified: Secondary | ICD-10-CM | POA: Insufficient documentation

## 2022-04-27 DIAGNOSIS — T451X5A Adverse effect of antineoplastic and immunosuppressive drugs, initial encounter: Secondary | ICD-10-CM | POA: Insufficient documentation

## 2022-04-27 NOTE — Assessment & Plan Note (Addendum)
01/02/2019:Routine screening mammogram detected a 1.2cm mass in the right breast at the 12:30 location 7 cm from the nipple, no axillary adenopathy. Biopsy showed IDC, grade 2-3, HER-2 - (1+), ER -, PR -, Ki67 80%.    03/01/2019: Right lumpectomy: Grade 3 IDC, 1.4 cm, margins clear, 0/3 lymph nodes negative, triple negative stage IIb   Treatment plan: 1. Adjuvant chemotherapy with dose dense Adriamycin and Cytoxan x4 followed by Taxol weekly x9 (completed February 2021) 2. 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  Currently on Eliquis because of the blood clot at the tip of the port. 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  Could be thrombus or tumor. TEE August 2021 at [facility]: Presence of the right atrial mass is still detected but the did not measure it. Patient is being followed by cardiology for this.   I sent a message to Dr. Brantley Stage to remove her port.  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  I encouraged her to watch her weight and exercise regularly.  Return to clinic in 1 year for follow-up

## 2022-04-28 ENCOUNTER — Telehealth: Payer: Self-pay | Admitting: Hematology and Oncology

## 2022-04-28 NOTE — Telephone Encounter (Signed)
Scheduled appointment per 12/5 los. Unable to leave a voicemail due to mailbox not being set up. Patient will be mailed an updated calendar.

## 2022-06-28 ENCOUNTER — Other Ambulatory Visit: Payer: Self-pay | Admitting: Hematology and Oncology

## 2022-07-28 ENCOUNTER — Other Ambulatory Visit: Payer: Self-pay | Admitting: Hematology and Oncology

## 2022-09-10 IMAGING — MG MM DIGITAL DIAGNOSTIC UNILAT*R* W/ TOMO W/ CAD
8 of 10 series · 8 of 22 positions shown · non-contrast
Comparison: Previous exam(s).

CLINICAL DATA: 65-year-old female presenting for first six-month
follow-up of probably benign right breast calcifications likely
related to fat necrosis from prior malignant lumpectomy in February 2019.

EXAM:
DIGITAL DIAGNOSTIC UNILATERAL RIGHT MAMMOGRAM WITH TOMOSYNTHESIS AND
CAD
TECHNIQUE: Right digital diagnostic mammography and breast tomosynthesis was
performed. The images were evaluated with computer-aided detection.

[R CC (1 of 3)]
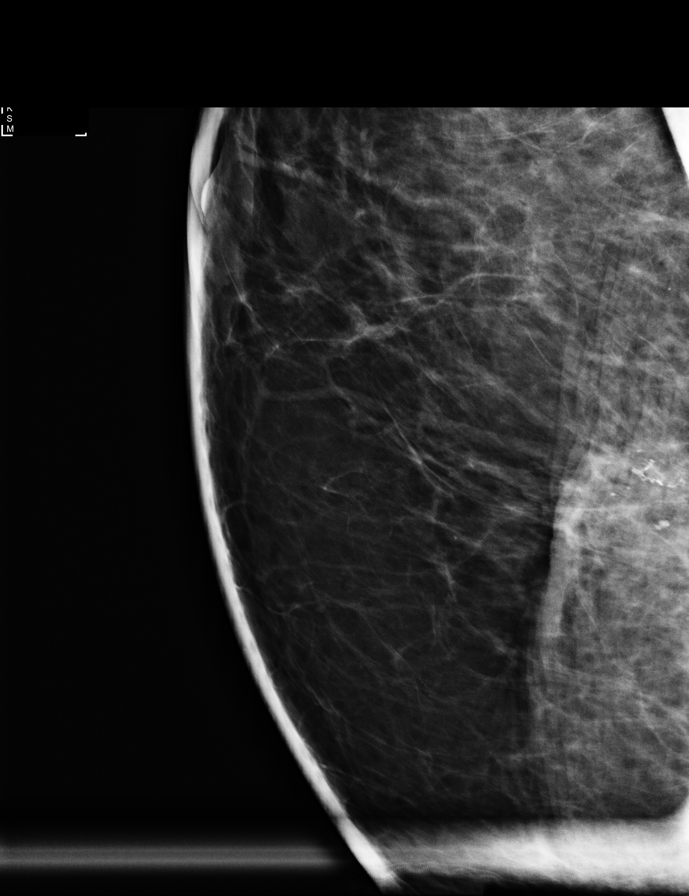

[R CC (2 of 3)]
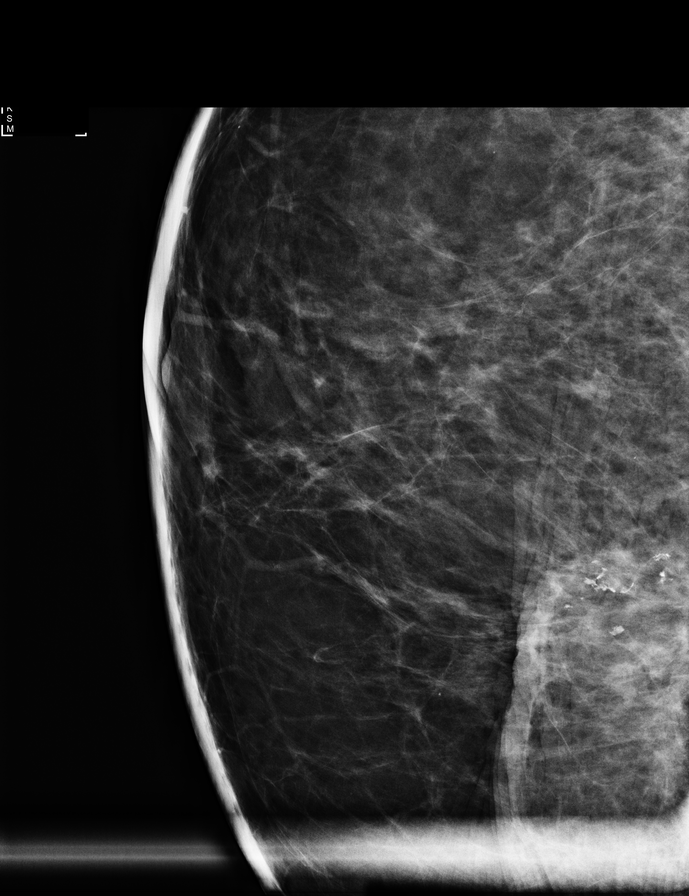

[R CC (3 of 3)]
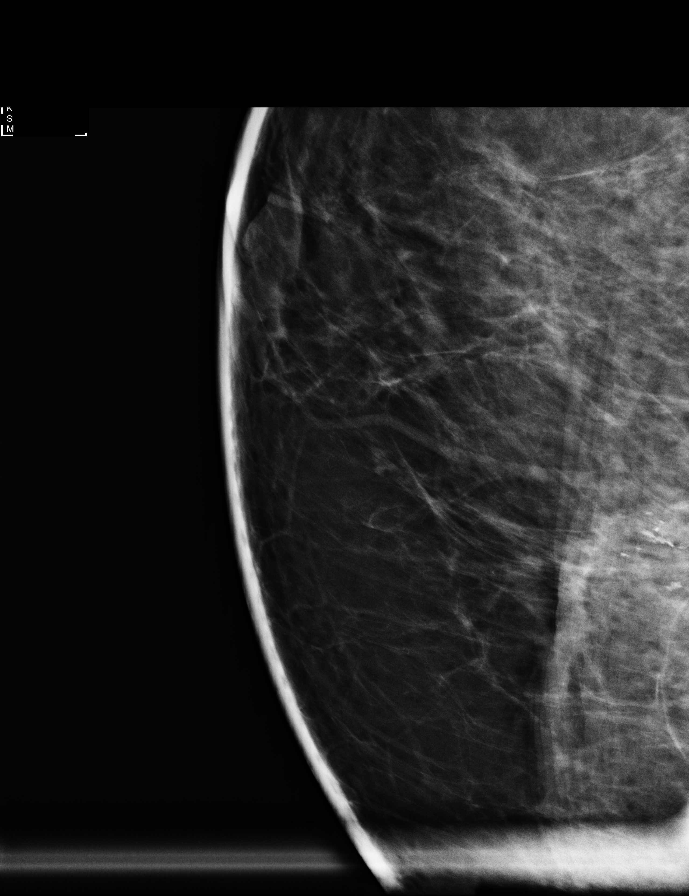

[R MLO]
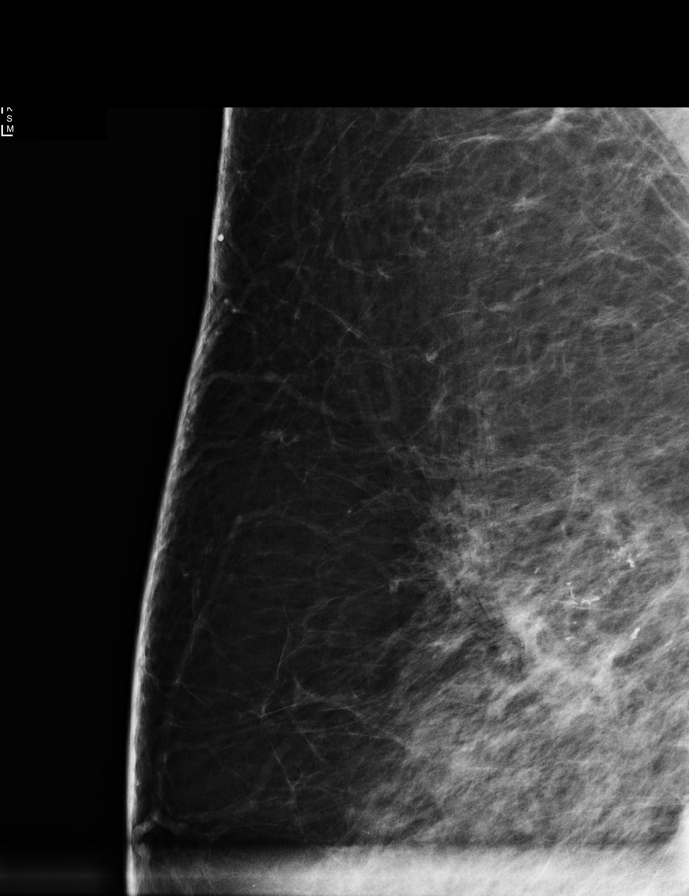

[R CC synth-2D (1 of 2)]
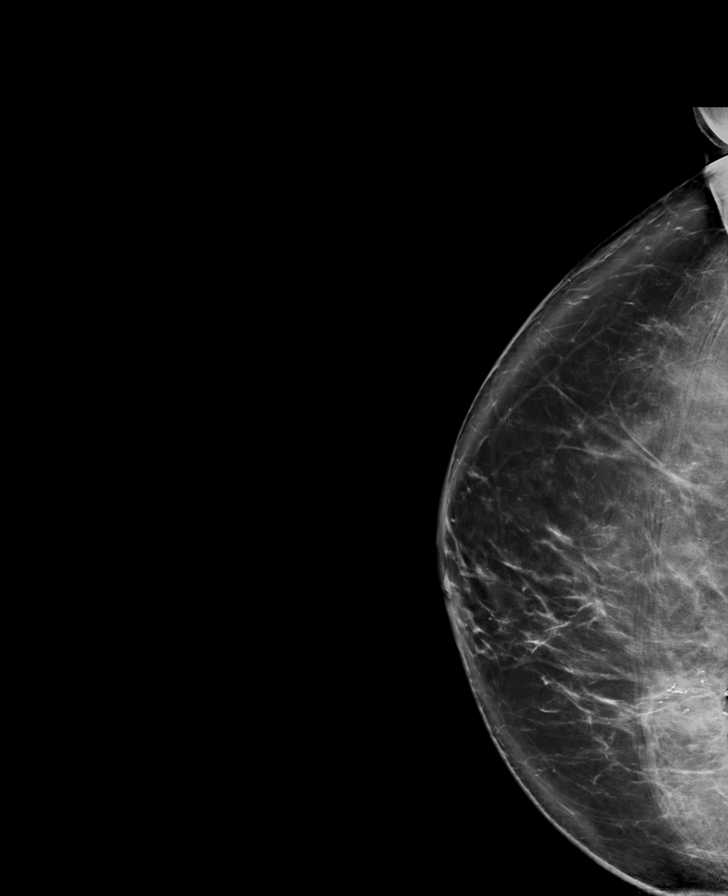

[R CC synth-2D (2 of 2)]
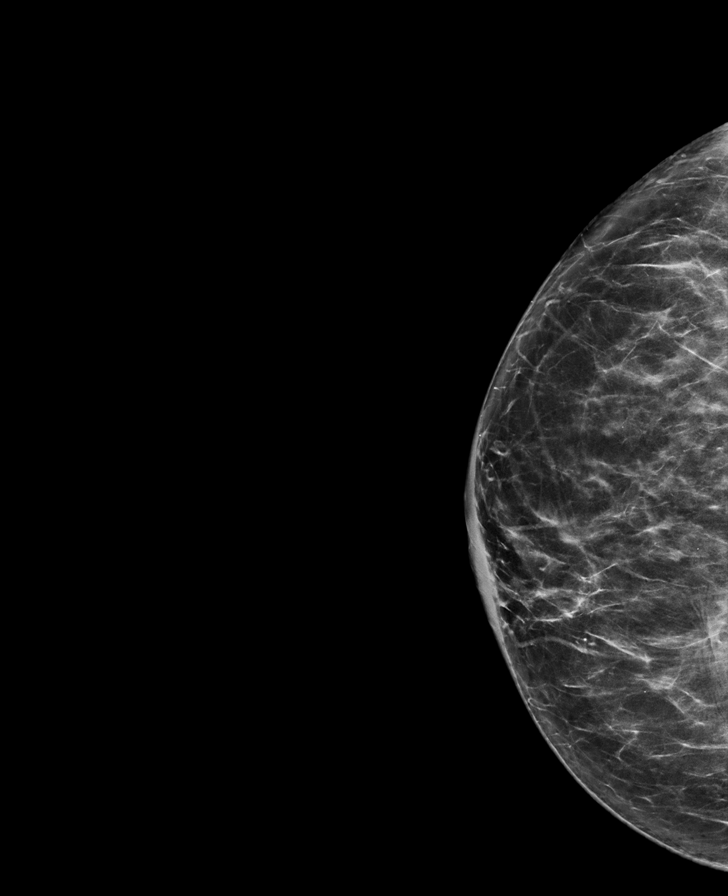

[R MLO synth-2D]
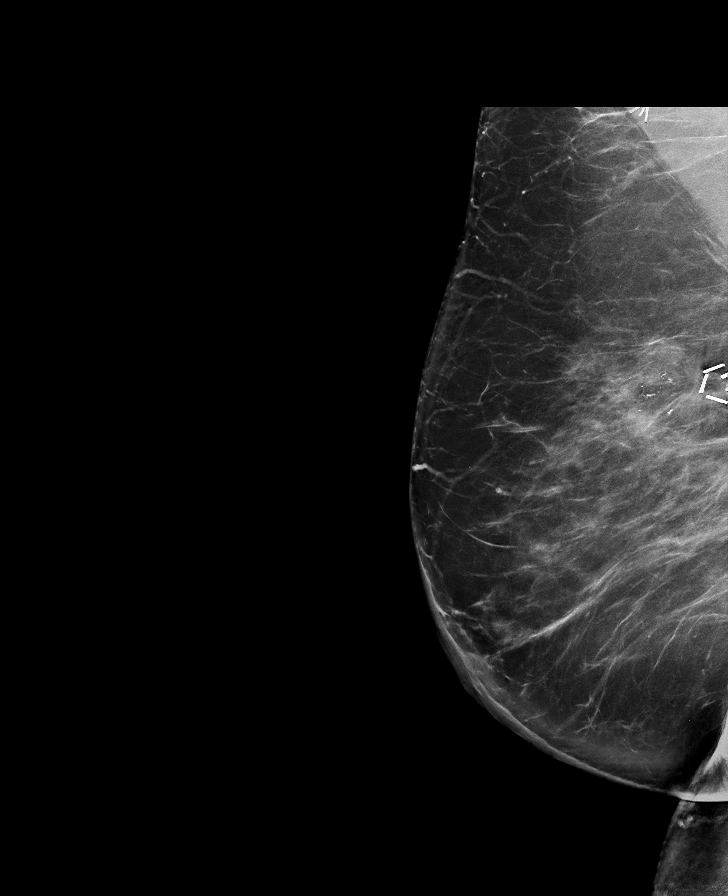

[R MLO tomo · tomo slice 53/106.0]
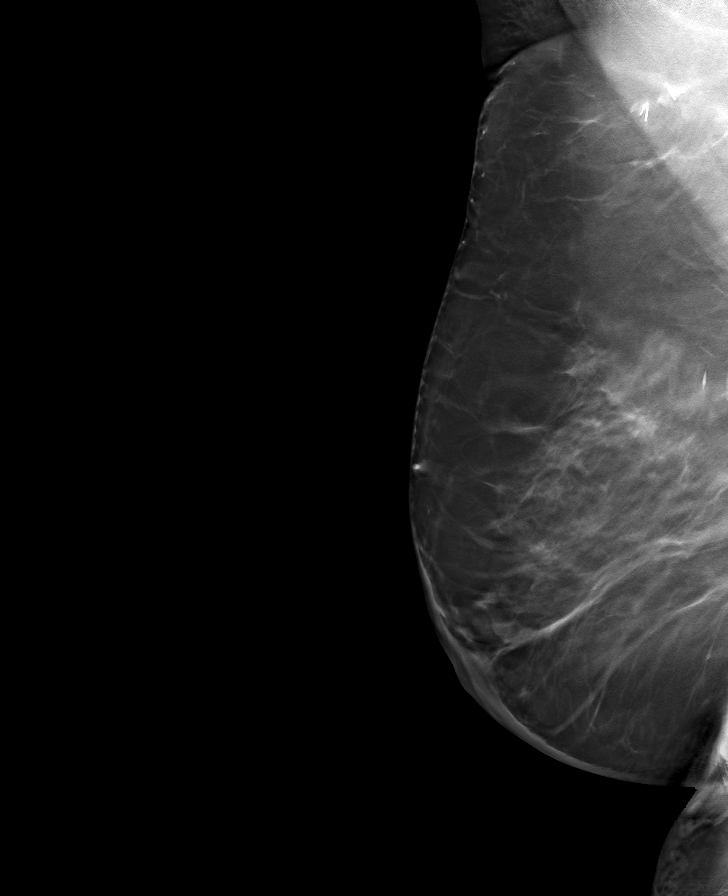

[8 of 22 positions shown; findings below may reference images not displayed]

ACR Breast Density Category b: There are scattered areas of
fibroglandular density.
FINDINGS: Curvilinear calcifications in association with the patient's right
lumpectomy site are increasingly coarsened, again suggesting a
benign etiology. Otherwise, no new or suspicious findings in either
breast.
IMPRESSION: Probably benign right breast calcifications. Recommend continued
short-term follow-up.

RECOMMENDATION:
Bilateral diagnostic mammogram in 6 months.

I have discussed the findings and recommendations with the patient.
If applicable, a reminder letter will be sent to the patient
regarding the next appointment.

BI-RADS CATEGORY  3: Probably benign.

## 2022-09-23 ENCOUNTER — Ambulatory Visit
Admission: RE | Admit: 2022-09-23 | Discharge: 2022-09-23 | Disposition: A | Discharge status: Home / Self Care | Payer: Medicare Other | Source: Ambulatory Visit | Attending: Family Medicine | Admitting: Family Medicine

## 2022-09-23 DIAGNOSIS — Z780 Asymptomatic menopausal state: Secondary | ICD-10-CM

## 2022-09-29 ENCOUNTER — Other Ambulatory Visit: Payer: Self-pay | Admitting: Hematology and Oncology

## 2022-12-07 ENCOUNTER — Other Ambulatory Visit: Payer: Self-pay | Admitting: Hematology and Oncology

## 2022-12-07 DIAGNOSIS — Z9889 Other specified postprocedural states: Secondary | ICD-10-CM

## 2022-12-16 LAB — AMB RESULTS CONSOLE CBG: Glucose: 104

## 2022-12-30 ENCOUNTER — Encounter: Payer: Self-pay | Admitting: *Deleted

## 2022-12-30 NOTE — Progress Notes (Signed)
Pt attended 12/16/22 screening event where her b/p was 128/72 and her blood sugar was 104. At the event, the pt confirmed her PCP is at [facility] and did not identify any SDOH insecurities. Chart review indicates pt's PCP at [facility] is Kathleen Cipro NP, whom she has seen most recently on 09/24/22 and 10/06/22. Pt is also under active oncology surveillance by Dr. Pamelia Potter, with whom she has a future appt on 05/02/23. No additional health equity team support indicated at this time.

## 2023-01-09 ENCOUNTER — Encounter (HOSPITAL_COMMUNITY): Payer: Self-pay

## 2023-01-09 ENCOUNTER — Emergency Department (HOSPITAL_COMMUNITY): Payer: Medicare Other

## 2023-01-09 ENCOUNTER — Emergency Department (HOSPITAL_COMMUNITY)
Admission: EM | Admit: 2023-01-09 | Discharge: 2023-01-09 | Disposition: A | Discharge status: Home / Self Care | Payer: Medicare Other | Attending: Emergency Medicine | Admitting: Emergency Medicine

## 2023-01-09 DIAGNOSIS — R55 Syncope and collapse: Secondary | ICD-10-CM | POA: Diagnosis present

## 2023-01-09 DIAGNOSIS — Z91040 Latex allergy status: Secondary | ICD-10-CM | POA: Diagnosis not present

## 2023-01-09 LAB — URINALYSIS, ROUTINE W REFLEX MICROSCOPIC
Bilirubin Urine: NEGATIVE
Glucose, UA: NEGATIVE mg/dL
Hgb urine dipstick: NEGATIVE
Ketones, ur: 5 mg/dL — AB
Leukocytes,Ua: NEGATIVE
Nitrite: NEGATIVE
Protein, ur: NEGATIVE mg/dL
Specific Gravity, Urine: 1.006 (ref 1.005–1.030)
pH: 6 (ref 5.0–8.0)

## 2023-01-09 LAB — BASIC METABOLIC PANEL
Anion gap: 9 (ref 5–15)
BUN: 14 mg/dL (ref 8–23)
CO2: 24 mmol/L (ref 22–32)
Calcium: 9.1 mg/dL (ref 8.9–10.3)
Chloride: 104 mmol/L (ref 98–111)
Creatinine, Ser: 1.15 mg/dL — ABNORMAL HIGH (ref 0.44–1.00)
GFR, Estimated: 52 mL/min — ABNORMAL LOW (ref >60)
Glucose, Bld: 105 mg/dL — ABNORMAL HIGH (ref 70–99)
Potassium: 3.4 mmol/L — ABNORMAL LOW (ref 3.5–5.1)
Sodium: 137 mmol/L (ref 135–145)

## 2023-01-09 LAB — CBC
HCT: 39.9 % (ref 36.0–46.0)
Hemoglobin: 12.4 g/dL (ref 12.0–15.0)
MCH: 23.2 pg — ABNORMAL LOW (ref 26.0–34.0)
MCHC: 31.1 g/dL (ref 30.0–36.0)
MCV: 74.7 fL — ABNORMAL LOW (ref 80.0–100.0)
Platelets: 207 10*3/uL (ref 150–400)
RBC: 5.34 MIL/uL — ABNORMAL HIGH (ref 3.87–5.11)
RDW: 16.1 % — ABNORMAL HIGH (ref 11.5–15.5)
WBC: 8 10*3/uL (ref 4.0–10.5)
nRBC: 0 % (ref 0.0–0.2)

## 2023-01-09 LAB — CBG MONITORING, ED: Glucose-Capillary: 106 mg/dL — ABNORMAL HIGH (ref 70–99)

## 2023-01-09 MED ORDER — ACETAMINOPHEN 500 MG PO TABS: 1000.0000 mg | ORAL_TABLET | Freq: Once | ORAL | Status: DC

## 2023-01-09 MED ORDER — ONDANSETRON HCL 4 MG/2ML IJ SOLN
4.0000 mg | Freq: Once | INTRAMUSCULAR | Status: AC
  Administered 2023-01-09: 4 mg via INTRAVENOUS
  Filled 2023-01-09: qty 2

## 2023-01-09 MED ORDER — SODIUM CHLORIDE 0.9 % IV BOLUS
1000.0000 mL | Freq: Once | INTRAVENOUS | Status: AC
  Administered 2023-01-09: 1000 mL via INTRAVENOUS

## 2023-01-09 NOTE — ED Provider Notes (Signed)
[place] EMERGENCY DEPARTMENT AT [facility] Provider Note   CSN: [id number] Arrival date & time: 01/09/23  1708     History {Add pertinent medical, surgical, social history, OB history to HPI:1} Chief Complaint  Patient presents with   Loss of Consciousness    Kathleen Potter is a 67 y.o. female.  67 year old female with prior medical history as detailed below presents for evaluation. She reports that she was in her normal state of health and went to get dinner at [employer]. While waiting in the restaurant she began to feel hot and nauseated. She went to sit in her car with her sister and then reports having increased nausea. She then had brief syncope while seated in car.   She feels improved now.   Denies associated chest pain, shortness of breath, other complaint.   The history is provided by the patient and medical records.       Home Medications Prior to Admission medications   Medication Sig Start Date End Date Taking? Authorizing Provider  Cholecalciferol (VITAMIN D) 50 MCG (2000 UT) tablet Take 2,000 Units by mouth 3 (three) times a week.    [provider]  Cyanocobalamin (B-12-SL) 1000 MCG SUBL Place 1,000 mcg under the tongue daily.    [provider]  furosemide (LASIX) 20 MG tablet TAKE 1 TABLET BY MOUTH EVERY DAY 07/28/22   Serena Croissant, MD  hydrocortisone-pramoxine [facility]) rectal foam Place 1 applicator rectally 3 (three) times daily. Apply  as needed 08/28/21   Meredeth Ide, MD  lidocaine-prilocaine (EMLA) cream Apply to affected area once 06/19/19   Serena Croissant, MD  potassium chloride (KLOR-CON) 10 MEQ tablet TAKE 1 TABLET BY MOUTH EVERY DAY 06/28/22   Serena Croissant, MD  pramoxine (PROCTOFOAM) 1 % foam Place 1 application. rectally 3 (three) times daily as needed for anal itching. 08/28/21   Meredeth Ide, MD  pyridOXINE (VITAMIN B-6) 100 MG tablet Take 100 mg by mouth 3 (three) times a week.    [provider]  rivaroxaban (XARELTO) 20 MG TABS tablet Take 1 tablet (20 mg total) by mouth daily with supper. Appointment needed with cardiologist for further refills 02/23/21   Chrystie Nose, MD  TURMERIC CURCUMIN PO Take 1 tablet by mouth 3 (three) times a week.    [provider]      Allergies    Latex    Review of Systems   Review of Systems  All other systems reviewed and are negative.   Physical Exam Updated Vital Signs BP 136/81 (BP Location: Left Arm)   Pulse 80   Temp 98.4 F (36.9 C) (Oral)   Resp 14   SpO2 99%  Physical Exam Vitals and nursing note reviewed.  Constitutional:      General: She is not in acute distress.    Appearance: Normal appearance. She is well-developed.  HENT:     Head: Normocephalic and atraumatic.  Eyes:     Conjunctiva/sclera: Conjunctivae normal.     Pupils: Pupils are equal, round, and reactive to light.  Cardiovascular:     Rate and Rhythm: Normal rate and regular rhythm.     Heart sounds: Normal heart sounds.  Pulmonary:     Effort: Pulmonary effort is normal. No respiratory distress.     Breath sounds: Normal breath sounds.  Abdominal:     General: There is no distension.     Palpations: Abdomen is soft.     Tenderness:  There is no abdominal tenderness.  Musculoskeletal:        General: No deformity. Normal range of motion.     Cervical back: Normal range of motion and neck supple.  Skin:    General: Skin is warm and dry.  Neurological:     General: No focal deficit present.     Mental Status: She is alert and oriented to person, place, and time.     ED Results / Procedures / Treatments   Labs (all labs ordered are listed, but only abnormal results are displayed) Labs Reviewed  CBG MONITORING, ED - Abnormal; Notable for the following components:      Result Value   Glucose-Capillary 106 (*)    All other components within normal limits  BASIC METABOLIC PANEL  CBC  URINALYSIS, ROUTINE W REFLEX MICROSCOPIC  CBG  MONITORING, ED    EKG EKG Interpretation Date/Time:  Sunday January 09 2023 17:26:19 EDT Ventricular Rate:  77 PR Interval:  147 QRS Duration:  89 QT Interval:  402 QTC Calculation: 455 R Axis:   55  Text Interpretation: Sinus rhythm Borderline T abnormalities, anterior leads Confirmed by Kristine Royal [phone]) on 01/09/2023 5:38:59 PM  Radiology No results found.  Procedures Procedures  {Document cardiac monitor, telemetry assessment procedure when appropriate:1}  Medications Ordered in ED Medications  sodium chloride 0.9 % bolus 1,000 mL (has no administration in time range)  ondansetron (ZOFRAN) injection 4 mg (has no administration in time range)  acetaminophen (TYLENOL) tablet 1,000 mg (has no administration in time range)    ED Course/ Medical Decision Making/ A&P   {   Click here for ABCD2, HEART and other calculatorsREFRESH Note before signing :1}                              Medical Decision Making Amount and/or Complexity of Data Reviewed Labs: ordered. Radiology: ordered.  Risk OTC drugs. Prescription drug management.    Medical Screen Complete  This patient presented to the ED with complaint of ***.  This complaint involves an extensive number of treatment options. The initial differential diagnosis includes, but is not limited to, ***  This presentation is: {IllnessRisk:19196::"***","Acute","Chronic","Self-Limited","Previously Undiagnosed","Uncertain Prognosis","Complicated","Systemic Symptoms","Threat to Life/Bodily Function"}    Co morbidities that complicated the patient's evaluation  ***   Additional history obtained:  Additional history obtained from {History source:19196::"EMS","Spouse","Family","Friend","Caregiver"} External records from outside sources obtained and reviewed including prior ED visits and prior Inpatient records.    Lab Tests:  I ordered and personally interpreted labs.  The pertinent results include:   ***   Imaging Studies ordered:  I ordered imaging studies including ***  I independently visualized and interpreted obtained imaging which showed *** I agree with the radiologist interpretation.   Cardiac Monitoring:  The patient was maintained on a cardiac monitor.  I personally viewed and interpreted the cardiac monitor which showed an underlying rhythm of: ***   Medicines ordered:  I ordered medication including ***  for ***  Reevaluation of the patient after these medicines showed that the patient: {resolved/improved/worsened:23923::"improved"}    Test Considered:  ***   Critical Interventions:  ***   Consultations Obtained:  I consulted ***,  and discussed lab and imaging findings as well as pertinent plan of care.    Problem List / ED Course:  ***   Reevaluation:  After the interventions noted above, I reevaluated the patient and found that they have: {resolved/improved/worsened:23923::"improved"}   Social  Determinants of Health:  ***   Disposition:  After consideration of the diagnostic results and the patients response to treatment, I feel that the patent would benefit from ***.    {Document critical care time when appropriate:1} {Document review of labs and clinical decision tools ie heart score, Chads2Vasc2 etc:1}  {Document your independent review of radiology images, and any outside records:1} {Document your discussion with family members, caretakers, and with consultants:1} {Document social determinants of health affecting pt's care:1} {Document your decision making why or why not admission, treatments were needed:1} Final Clinical Impression(s) / ED Diagnoses Final diagnoses:  None    Rx / DC Orders ED Discharge Orders     None

## 2023-01-09 NOTE — Discharge Instructions (Signed)
Return for any problem.  ?

## 2023-01-09 NOTE — ED Triage Notes (Signed)
Pt arrived via POV, was at [employer], felt overheated, had episode approx 30 seconds of unresponsive and snoring respirations. Pt able to answer questions in triage, but slow to answer.

## 2023-01-13 ENCOUNTER — Ambulatory Visit
Admission: RE | Admit: 2023-01-13 | Discharge: 2023-01-13 | Disposition: A | Discharge status: Home / Self Care | Payer: Medicare Other | Source: Ambulatory Visit | Attending: Hematology and Oncology | Admitting: Hematology and Oncology

## 2023-01-13 DIAGNOSIS — Z9889 Other specified postprocedural states: Secondary | ICD-10-CM

## 2023-05-02 ENCOUNTER — Inpatient Hospital Stay: Payer: Medicare Other | Attending: Hematology and Oncology | Admitting: Hematology and Oncology

## 2023-05-02 VITALS — BP 141/72 | HR 79 | Temp 97.7°F | Resp 18 | Ht 66.0 in | Wt 269.7 lb

## 2023-05-02 DIAGNOSIS — Z79899 Other long term (current) drug therapy: Secondary | ICD-10-CM | POA: Insufficient documentation

## 2023-05-02 DIAGNOSIS — R609 Edema, unspecified: Secondary | ICD-10-CM | POA: Insufficient documentation

## 2023-05-02 DIAGNOSIS — Z171 Estrogen receptor negative status [ER-]: Secondary | ICD-10-CM | POA: Insufficient documentation

## 2023-05-02 DIAGNOSIS — G620 Drug-induced polyneuropathy: Secondary | ICD-10-CM | POA: Diagnosis not present

## 2023-05-02 DIAGNOSIS — Z923 Personal history of irradiation: Secondary | ICD-10-CM | POA: Diagnosis not present

## 2023-05-02 DIAGNOSIS — C50211 Malignant neoplasm of upper-inner quadrant of right female breast: Secondary | ICD-10-CM | POA: Diagnosis not present

## 2023-05-02 DIAGNOSIS — Z9221 Personal history of antineoplastic chemotherapy: Secondary | ICD-10-CM | POA: Insufficient documentation

## 2023-05-02 DIAGNOSIS — T451X5A Adverse effect of antineoplastic and immunosuppressive drugs, initial encounter: Secondary | ICD-10-CM | POA: Insufficient documentation

## 2023-05-02 DIAGNOSIS — Z7901 Long term (current) use of anticoagulants: Secondary | ICD-10-CM | POA: Diagnosis not present
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
## 2023-05-02 NOTE — Assessment & Plan Note (Signed)
01/02/2019:Routine screening mammogram detected a 1.2cm mass in the right breast at the 12:30 location 7 cm from the nipple, no axillary adenopathy. Biopsy showed IDC, grade 2-3, HER-2 - (1+), ER -, PR -, Ki67 80%.    03/01/2019: Right lumpectomy: Grade 3 IDC, 1.4 cm, margins clear, 0/3 lymph nodes negative, triple negative stage IIb   Treatment plan: 1. Adjuvant chemotherapy with dose dense Adriamycin and Cytoxan x4 followed by Taxol weekly x9 (completed February 2021) 2. 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  Currently on Eliquis because of the blood clot at the tip of the port. 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  Could be thrombus or tumor. TEE August 2021 at [facility]: Presence of the right atrial mass is still detected but the did not measure it. Patient is being followed by cardiology for this.   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  I encouraged her to watch her weight and exercise regularly.   Return to clinic in 1 year for follow-up

## 2023-08-10 ENCOUNTER — Other Ambulatory Visit: Payer: Self-pay | Admitting: Hematology and Oncology

## 2023-09-17 ENCOUNTER — Other Ambulatory Visit: Payer: Self-pay | Admitting: Hematology and Oncology

## 2023-10-21 ENCOUNTER — Telehealth: Payer: Self-pay | Admitting: *Deleted

## 2023-10-21 DIAGNOSIS — C50211 Malignant neoplasm of upper-inner quadrant of right female breast: Secondary | ICD-10-CM

## 2023-10-21 DIAGNOSIS — Z95828 Presence of other vascular implants and grafts: Secondary | ICD-10-CM

## 2023-10-21 NOTE — Telephone Encounter (Signed)
 Received call from pt requesting port a cath be removed at [facility] due to [facility] and Dr. Afton Horse no longer being in network with her insurance.  Verbal orders and received by MD for port a cath removal with IR.  Pt notified and verbalized understanding.

## 2023-12-07 ENCOUNTER — Other Ambulatory Visit: Payer: Self-pay | Admitting: Hematology and Oncology

## 2023-12-07 DIAGNOSIS — Z853 Personal history of malignant neoplasm of breast: Secondary | ICD-10-CM

## 2023-12-14 ENCOUNTER — Other Ambulatory Visit: Payer: Self-pay | Admitting: Radiology

## 2023-12-15 ENCOUNTER — Ambulatory Visit (HOSPITAL_COMMUNITY)
Admission: RE | Admit: 2023-12-15 | Discharge: 2023-12-15 | Disposition: A | Discharge status: Home / Self Care | Source: Ambulatory Visit | Attending: Hematology and Oncology | Admitting: Hematology and Oncology

## 2023-12-15 DIAGNOSIS — Z171 Estrogen receptor negative status [ER-]: Secondary | ICD-10-CM | POA: Diagnosis not present

## 2023-12-15 DIAGNOSIS — Z452 Encounter for adjustment and management of vascular access device: Secondary | ICD-10-CM | POA: Insufficient documentation

## 2023-12-15 DIAGNOSIS — C50211 Malignant neoplasm of upper-inner quadrant of right female breast: Secondary | ICD-10-CM | POA: Insufficient documentation

## 2023-12-15 DIAGNOSIS — Z95828 Presence of other vascular implants and grafts: Secondary | ICD-10-CM

## 2023-12-15 HISTORY — PX: IR REMOVAL TUN ACCESS W/ PORT W/O FL MOD SED: IMG2290

## 2023-12-15 MED ORDER — LIDOCAINE-EPINEPHRINE 1 %-1:100000 IJ SOLN
20.0000 mL | Freq: Once | INTRAMUSCULAR | Status: AC
  Administered 2023-12-15: 3 mL via INTRADERMAL

## 2023-12-15 MED ORDER — LIDOCAINE-EPINEPHRINE 1 %-1:100000 IJ SOLN
INTRAMUSCULAR | Status: AC
  Filled 2023-12-15: qty 1

## 2024-01-20 ENCOUNTER — Ambulatory Visit
Admission: RE | Admit: 2024-01-20 | Discharge: 2024-01-20 | Disposition: A | Discharge status: Home / Self Care | Source: Ambulatory Visit | Attending: Hematology and Oncology | Admitting: Hematology and Oncology

## 2024-01-20 DIAGNOSIS — Z853 Personal history of malignant neoplasm of breast: Secondary | ICD-10-CM

## 2024-05-07 ENCOUNTER — Other Ambulatory Visit: Payer: Self-pay | Admitting: *Deleted

## 2024-05-07 DIAGNOSIS — C50211 Malignant neoplasm of upper-inner quadrant of right female breast: Secondary | ICD-10-CM

## 2024-05-08 ENCOUNTER — Inpatient Hospital Stay: Attending: Hematology and Oncology

## 2024-05-08 ENCOUNTER — Inpatient Hospital Stay: Admitting: Hematology and Oncology

## 2024-05-08 VITALS — BP 144/61 | HR 69 | Temp 98.0°F | Resp 18 | Wt 266.5 lb

## 2024-05-08 DIAGNOSIS — C50211 Malignant neoplasm of upper-inner quadrant of right female breast: Secondary | ICD-10-CM | POA: Diagnosis present

## 2024-05-08 DIAGNOSIS — R609 Edema, unspecified: Secondary | ICD-10-CM | POA: Insufficient documentation

## 2024-05-08 DIAGNOSIS — Z79899 Other long term (current) drug therapy: Secondary | ICD-10-CM | POA: Diagnosis not present

## 2024-05-08 DIAGNOSIS — Z923 Personal history of irradiation: Secondary | ICD-10-CM | POA: Insufficient documentation

## 2024-05-08 DIAGNOSIS — Z1722 Progesterone receptor negative status: Secondary | ICD-10-CM | POA: Insufficient documentation

## 2024-05-08 DIAGNOSIS — Z1732 Human epidermal growth factor receptor 2 negative status: Secondary | ICD-10-CM | POA: Insufficient documentation

## 2024-05-08 DIAGNOSIS — Z171 Estrogen receptor negative status [ER-]: Secondary | ICD-10-CM | POA: Diagnosis not present

## 2024-05-08 DIAGNOSIS — T451X5A Adverse effect of antineoplastic and immunosuppressive drugs, initial encounter: Secondary | ICD-10-CM | POA: Diagnosis not present

## 2024-05-08 DIAGNOSIS — G620 Drug-induced polyneuropathy: Secondary | ICD-10-CM | POA: Insufficient documentation

## 2024-05-08 DIAGNOSIS — Z7901 Long term (current) use of anticoagulants: Secondary | ICD-10-CM | POA: Insufficient documentation

## 2024-05-08 DIAGNOSIS — Z9221 Personal history of antineoplastic chemotherapy: Secondary | ICD-10-CM | POA: Insufficient documentation

## 2024-05-08 LAB — CBC WITH DIFFERENTIAL (CANCER CENTER ONLY)
Abs Immature Granulocytes: 0.02 K/uL (ref 0.00–0.07)
Basophils Absolute: 0 K/uL (ref 0.0–0.1)
Basophils Relative: 0 %
Eosinophils Absolute: 0.1 K/uL (ref 0.0–0.5)
Eosinophils Relative: 1 %
HCT: 37.2 % (ref 36.0–46.0)
Hemoglobin: 11.8 g/dL — ABNORMAL LOW (ref 12.0–15.0)
Immature Granulocytes: 0 %
Lymphocytes Relative: 35 %
Lymphs Abs: 2 K/uL (ref 0.7–4.0)
MCH: 23.1 pg — ABNORMAL LOW (ref 26.0–34.0)
MCHC: 31.7 g/dL (ref 30.0–36.0)
MCV: 72.9 fL — ABNORMAL LOW (ref 80.0–100.0)
Monocytes Absolute: 0.4 K/uL (ref 0.1–1.0)
Monocytes Relative: 7 %
Neutro Abs: 3.2 K/uL (ref 1.7–7.7)
Neutrophils Relative %: 57 %
Platelet Count: 198 K/uL (ref 150–400)
RBC: 5.1 MIL/uL (ref 3.87–5.11)
RDW: 16.3 % — ABNORMAL HIGH (ref 11.5–15.5)
WBC Count: 5.7 K/uL (ref 4.0–10.5)
nRBC: 0 % (ref 0.0–0.2)

## 2024-05-08 LAB — CMP (CANCER CENTER ONLY)
ALT: 20 U/L (ref 0–44)
AST: 22 U/L (ref 15–41)
Albumin: 4.1 g/dL (ref 3.5–5.0)
Alkaline Phosphatase: 84 U/L (ref 38–126)
Anion gap: 8 (ref 5–15)
BUN: 10 mg/dL (ref 8–23)
CO2: 26 mmol/L (ref 22–32)
Calcium: 9.6 mg/dL (ref 8.9–10.3)
Chloride: 106 mmol/L (ref 98–111)
Creatinine: 0.95 mg/dL (ref 0.44–1.00)
GFR, Estimated: >60 mL/min (ref >60)
Glucose, Bld: 108 mg/dL — ABNORMAL HIGH (ref 70–99)
Potassium: 4.2 mmol/L (ref 3.5–5.1)
Sodium: 140 mmol/L (ref 135–145)
Total Bilirubin: 0.4 mg/dL (ref 0.0–1.2)
Total Protein: 7.5 g/dL (ref 6.5–8.1)

## 2024-05-08 MED ORDER — LOSARTAN POTASSIUM 50 MG PO TABS: 50.0000 mg | ORAL_TABLET | Freq: Every day | ORAL | Status: AC

## 2024-05-08 MED ORDER — EZETIMIBE 10 MG PO TABS: 10.0000 mg | ORAL_TABLET | Freq: Every day | ORAL | Status: AC

## 2024-05-08 NOTE — Progress Notes (Signed)
 Patient Care Team: Dayna Motto, DO as PCP - General (Family Medicine) Vanderbilt Ned, MD as Consulting Physician (General Surgery) Odean Potts, MD as Consulting Physician (Hematology and Oncology) Dewey Rush, MD as Consulting Physician (Radiation Oncology)  DIAGNOSIS:  Encounter Diagnosis  Name Primary?   Malignant neoplasm of upper-inner quadrant of right breast in female, estrogen receptor negative (HCC) Yes    SUMMARY OF ONCOLOGIC HISTORY: Oncology History  Malignant neoplasm of upper-inner quadrant of right breast in female, estrogen receptor negative (HCC)  01/02/2019 Initial Diagnosis   Routine screening mammogram detected a 1.2cm mass in the right breast at the 12:30 location 7 cm from the nipple, no axillary adenopathy. Biopsy showed IDC, grade 2-3, HER-2 - (1+), ER -, PR -, Ki67 80%.    01/03/2019 Cancer Staging   Staging form: Breast, AJCC 8th Edition - Clinical stage from 01/03/2019: Stage IB (cT1c, cN0, cM0, G3, ER-, PR-, HER2-)    03/01/2019 Surgery   Right breast lumpectomy ([facility]) ([id number]): IDC, grade 3, 1.4cm, clear margins, 3 lymph nodes negative for carcinoma.    03/09/2019 Cancer Staging   Staging form: Breast, AJCC 8th Edition - Pathologic stage from 03/09/2019: Stage IB (pT1c, pN0, cM0, G3, ER-, PR-, HER2-)    03/27/2019 - 07/17/2019 Chemotherapy   DOXOrubicin  (ADRIAMYCIN ) chemo injection 118 mg, 50 mg/m2 = 118 mg (83.3 % of original dose 60 mg/m2), Intravenous,  Once, 4 of 4 cycles. Dose modification: 50 mg/m2 (original dose 60 mg/m2, Cycle 1, Reason: Provider Judgment), 45 mg/m2 (original dose 60 mg/m2, Cycle 3, Reason: Provider Judgment) Administration: 118 mg (03/27/2019), 118 mg (04/10/2019), 106 mg (04/24/2019), 106 mg (05/08/2019)  palonosetron  (ALOXI ) injection 0.25 mg, 0.25 mg, Intravenous,  Once, 12 of 15 cycles Administration: 0.25 mg (03/27/2019), 0.25 mg (04/10/2019), 0.25 mg (04/24/2019), 0.25 mg (05/08/2019), 0.25 mg (05/29/2019), 0.25 mg  (06/06/2019), 0.25 mg (06/12/2019), 0.25 mg (06/19/2019), 0.25 mg (06/26/2019), 0.25 mg (07/03/2019), 0.25 mg (07/10/2019), 0.25 mg (07/17/2019)  pegfilgrastim -jmdb (FULPHILA ) injection 6 mg, 6 mg, Subcutaneous,  Once, 4 of 4 cycles. Administration: 6 mg (03/29/2019), 6 mg (04/12/2019), 6 mg (04/26/2019), 6 mg (05/10/2019)  cyclophosphamide  (CYTOXAN ) 1,180 mg in sodium chloride  0.9 % 250 mL chemo infusion, 500 mg/m2 = 1,180 mg (83.3 % of original dose 600 mg/m2), Intravenous,  Once, 4 of 4 cycles Dose modification: 500 mg/m2 (original dose 600 mg/m2, Cycle 1, Reason: Provider Judgment), 450 mg/m2 (original dose 600 mg/m2, Cycle 3, Reason: Provider Judgment) Administration: 1,180 mg (03/27/2019), 1,180 mg (04/10/2019), 1,060 mg (04/24/2019), 1,060 mg (05/08/2019)  PACLitaxel  (TAXOL ) 192 mg in sodium chloride  0.9 % 250 mL chemo infusion (</= 80mg /m2), 80 mg/m2 = 192 mg, Intravenous,  Once, 9 of 12 cycles Dose modification: 65 mg/m2 (original dose 80 mg/m2, Cycle 12, Reason: Dose not tolerated) Administration: 192 mg (05/22/2019), 192 mg (05/29/2019), 192 mg (06/06/2019), 192 mg (06/12/2019), 192 mg (06/19/2019), 192 mg (06/26/2019), 192 mg (07/03/2019), 156 mg (07/10/2019), 156 mg (07/17/2019)  fosaprepitant  (EMEND ) 150 mg, dexamethasone  (DECADRON ) 12 mg in sodium chloride  0.9 % 145 mL IVPB, , Intravenous,  Once, 4 of 4 cycles Administration:  (03/27/2019),  (04/10/2019),  (04/24/2019),  (05/08/2019)   08/15/2019 - 09/11/2019 Radiation Therapy   The patient initially received a dose of 42.56 Gy in 16 fractions to the breast using whole-breast tangent fields. This was delivered using a 3-D conformal technique. The patient then received a boost to the seroma. This delivered an additional 10 Gy in 35fractions using an en face electron field due to the depth of  the seroma. The total dose was 52.56Gy.     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  She was diagnosed with right breast invasive ductal carcinoma five years ago and completed lumpectomy (02/2019), adjuvant dose-dense doxorubicin  and cyclophosphamide  followed by weekly paclitaxel  (completed 06/2019), and adjuvant radiation therapy (completed 08/2019). Her most recent mammogram in August 2024 was normal.  She has chemotherapy-induced peripheral neuropathy that has gradually improved. Symptoms are relieved by ambulation and recumbency. Prolonged driving causes transient heaviness in her feet, which resolves and does not disturb sleep. She notes possible worsening with salty foods and has no nocturnal neuropathy.  She previously had a right atrial mass on cardiac imaging, now resolved on cardiology follow-up one month ago, likely related to prior anticoagulation. Her cardiologist recently added ezetimibe , though she is unsure of the dose and will provide this.  She is interested in ongoing cancer surveillance and plans blood-based Guardant testing for breast cancer recurrence monitoring every six months in addition to annual mammograms.  She has worked on [employer] with prior 11-pound loss but has recently regained weight due to increased appetite and holiday eating.       ALLERGIES:  is allergic to latex.  MEDICATIONS:  Current Outpatient Medications  Medication Sig Dispense Refill   Cholecalciferol  (VITAMIN D) 50 MCG (2000 UT) tablet Take 2,000 Units by mouth 3 (three) times a week. (Patient not taking: Reported on 01/09/2023)     Cyanocobalamin  (B-12-SL) 1000 MCG SUBL Place 1,000 mcg under the tongue daily.     furosemide  (LASIX ) 20 MG tablet TAKE 1 TABLET BY MOUTH EVERY DAY 90 tablet 3   potassium chloride  (KLOR-CON ) 10 MEQ tablet TAKE 1 TABLET BY MOUTH EVERY DAY 90 tablet 3   pyridOXINE  (VITAMIN  B-6) 100 MG tablet Take 100 mg by mouth daily.     rivaroxaban  (XARELTO ) 20 MG TABS tablet Take 1 tablet (20 mg total) by mouth daily with supper. Appointment needed with cardiologist for further refills (Patient taking differently: Take 20 mg by mouth daily.) 30 tablet 0   No current facility-administered medications for this visit.    PHYSICAL EXAMINATION: ECOG PERFORMANCE STATUS: 1 - Symptomatic but completely ambulatory  Vitals:   05/08/24 0858  BP: (!) 144/61  Pulse: 69  Resp: 18  Temp: 98 F (36.7 C)  SpO2: 99%   Filed Weights   05/08/24 0858  Weight: 266 lb 8 oz (120.9 kg)     LABORATORY DATA:  I have reviewed the data as listed    Latest Ref Rng & Units 05/08/2024    8:35 AM 01/09/2023    6:00 PM 08/28/2021    7:58 AM  CMP  Glucose 70 - 99 mg/dL 891  894  882   BUN 8 - 23 mg/dL 10  14  9    Creatinine 0.44 - 1.00 mg/dL 9.04  8.84  9.06   Sodium 135 - 145 mmol/L 140  137  140   Potassium 3.5 - 5.1 mmol/L 4.2  3.4  3.8   Chloride 98 - 111 mmol/L 106  104  107   CO2 22 - 32 mmol/L 26  24  27    Calcium 8.9 - 10.3 mg/dL 9.6  9.1  8.8   Total Protein 6.5 - 8.1 g/dL 7.5  Total Bilirubin 0.0 - 1.2 mg/dL 0.4     Alkaline Phos 38 - 126 U/L 84     AST 15 - 41 U/L 22     ALT 0 - 44 U/L 20       Lab Results  Component Value Date   WBC 5.7 05/08/2024   HGB 11.8 (L) 05/08/2024   HCT 37.2 05/08/2024   MCV 72.9 (L) 05/08/2024   PLT 198 05/08/2024   NEUTROABS 3.2 05/08/2024    ASSESSMENT & PLAN:  Malignant neoplasm of upper-inner quadrant of right breast in female, estrogen receptor negative (HCC) 01/02/2019:Routine screening mammogram detected a 1.2cm mass in the right breast at the 12:30 location 7 cm from the nipple, no axillary adenopathy. Biopsy showed IDC, grade 2-3, HER-2 - (1+), ER -, PR -, Ki67 80%.    03/01/2019: Right lumpectomy: Grade 3 IDC, 1.4 cm, margins clear, 0/3 lymph nodes negative, triple negative stage IIb   Treatment plan: 1. Adjuvant  chemotherapy with dose dense Adriamycin  and Cytoxan  x4 followed by Taxol  weekly x9 (completed February 2021) 2. 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  Currently on Eliquis  because of the blood clot at the tip of the port. 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  Could be thrombus or tumor. TEE August 2021 at [facility]: Presence of the right atrial mass is still detected but the did not measure it. Patient is being followed by cardiology for this. Echocardiogram at [facility]: No further evidence of right atrial mass   She has retired from [employer] in 2023.      Weight Management She joined a weight loss program and is making efforts to lose weight.  Encouraged her to join a gym and do more physical exercise.   Return to clinic in 1 year for follow-up      No orders of the defined types were placed in this encounter.  The patient has a good understanding of the overall plan. she agrees with it. she will call with any problems that may develop before the next visit here.  I personally spent a total of 30 minutes in the care of the patient today including preparing to see the patient, getting/reviewing separately obtained history, performing a medically appropriate exam/evaluation, counseling and educating, placing orders, referring and communicating with other health care professionals, documenting clinical information in the EHR,  independently interpreting results, communicating results, and coordinating care.   Kathleen K Rakeya Glab, MD 05/08/2024

## 2024-05-08 NOTE — Assessment & Plan Note (Signed)
 01/02/2019:Routine screening mammogram detected a 1.2cm mass in the right breast at the 12:30 location 7 cm from the nipple, no axillary adenopathy. Biopsy showed IDC, grade 2-3, HER-2 - (1+), ER -, PR -, Ki67 80%.    03/01/2019: Right lumpectomy: Grade 3 IDC, 1.4 cm, margins clear, 0/3 lymph nodes negative, triple negative stage IIb   Treatment plan: 1. Adjuvant chemotherapy with dose dense Adriamycin  and Cytoxan  x4 followed by Taxol  weekly x9 (completed February 2021) 2. 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  Currently on Eliquis  because of the blood clot at the tip of the port. 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  Could be thrombus or tumor. TEE August 2021 at [facility]: Presence of the right atrial mass is still detected but the did not measure it. Patient is being followed by cardiology for this.   She has retired from [employer] in 2023.  Unfortunately since then she has been taking care of her son who had sustained major injuries at work.   Weight Management Discussion of weight loss strategies, including potential use of weight loss injections.   Return to clinic in 1 year for follow-up

## 2024-05-09 ENCOUNTER — Encounter: Payer: Self-pay | Admitting: *Deleted

## 2024-05-09 NOTE — Progress Notes (Signed)
 Per MD request RN placed Guardant Reveal orders via their portal.

## 2024-05-29 ENCOUNTER — Telehealth: Payer: Self-pay

## 2024-05-29 NOTE — Telephone Encounter (Signed)
 Unresponsive to outreach for mobile draw with guardant.

## 2025-05-08 ENCOUNTER — Inpatient Hospital Stay: Admitting: Hematology and Oncology

## 2025-05-08 ENCOUNTER — Inpatient Hospital Stay
